# Patient Record
Sex: Female | Born: 1970 | Race: White | Hispanic: No | Marital: Single | Smoking: Current every day smoker
Health system: Southern US, Community
[De-identification: ages and names within clinical notes are randomized; demographics above are authoritative.]

## PROBLEM LIST (undated history)

## (undated) DIAGNOSIS — T4145XA Adverse effect of unspecified anesthetic, initial encounter: Secondary | ICD-10-CM

## (undated) DIAGNOSIS — E282 Polycystic ovarian syndrome: Secondary | ICD-10-CM

## (undated) DIAGNOSIS — K5792 Diverticulitis of intestine, part unspecified, without perforation or abscess without bleeding: Secondary | ICD-10-CM

## (undated) DIAGNOSIS — N946 Dysmenorrhea, unspecified: Secondary | ICD-10-CM

## (undated) DIAGNOSIS — T8859XA Other complications of anesthesia, initial encounter: Secondary | ICD-10-CM

## (undated) DIAGNOSIS — S2239XA Fracture of one rib, unspecified side, initial encounter for closed fracture: Secondary | ICD-10-CM

## (undated) DIAGNOSIS — F32A Depression, unspecified: Secondary | ICD-10-CM

## (undated) DIAGNOSIS — I82409 Acute embolism and thrombosis of unspecified deep veins of unspecified lower extremity: Secondary | ICD-10-CM

## (undated) DIAGNOSIS — S8011XA Contusion of right lower leg, initial encounter: Secondary | ICD-10-CM

## (undated) DIAGNOSIS — F419 Anxiety disorder, unspecified: Secondary | ICD-10-CM

## (undated) DIAGNOSIS — D689 Coagulation defect, unspecified: Secondary | ICD-10-CM

## (undated) DIAGNOSIS — N920 Excessive and frequent menstruation with regular cycle: Secondary | ICD-10-CM

## (undated) DIAGNOSIS — Z7901 Long term (current) use of anticoagulants: Secondary | ICD-10-CM

## (undated) DIAGNOSIS — Z9889 Other specified postprocedural states: Secondary | ICD-10-CM

## (undated) DIAGNOSIS — D6851 Activated protein C resistance: Secondary | ICD-10-CM

## (undated) DIAGNOSIS — F329 Major depressive disorder, single episode, unspecified: Secondary | ICD-10-CM

## (undated) DIAGNOSIS — K859 Acute pancreatitis without necrosis or infection, unspecified: Secondary | ICD-10-CM

## (undated) DIAGNOSIS — N809 Endometriosis, unspecified: Secondary | ICD-10-CM

## (undated) DIAGNOSIS — J189 Pneumonia, unspecified organism: Secondary | ICD-10-CM

## (undated) DIAGNOSIS — R112 Nausea with vomiting, unspecified: Secondary | ICD-10-CM

## (undated) HISTORY — DX: Fracture of one rib, unspecified side, initial encounter for closed fracture: S22.39XA

## (undated) HISTORY — DX: Excessive and frequent menstruation with regular cycle: N92.0

## (undated) HISTORY — DX: Diverticulitis of intestine, part unspecified, without perforation or abscess without bleeding: K57.92

## (undated) HISTORY — PX: ARTHROSCOPY KNEE W/ DRILLING: SUR92

## (undated) HISTORY — DX: Depression, unspecified: F32.A

## (undated) HISTORY — DX: Endometriosis, unspecified: N80.9

## (undated) HISTORY — PX: HERNIA REPAIR: SHX51

## (undated) HISTORY — DX: Polycystic ovarian syndrome: E28.2

## (undated) HISTORY — DX: Acute pancreatitis without necrosis or infection, unspecified: K85.90

## (undated) HISTORY — DX: Dysmenorrhea, unspecified: N94.6

## (undated) HISTORY — DX: Contusion of right lower leg, initial encounter: S80.11XA

## (undated) HISTORY — DX: Morbid (severe) obesity due to excess calories: E66.01

## (undated) HISTORY — DX: Anxiety disorder, unspecified: F41.9

## (undated) HISTORY — DX: Pneumonia, unspecified organism: J18.9

---
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
## 2003-06-14 HISTORY — PX: APPENDECTOMY: SHX54

## 2005-06-13 HISTORY — PX: BARIATRIC SURGERY: SHX1103

## 2005-11-04 ENCOUNTER — Ambulatory Visit: Payer: Self-pay | Admitting: Unknown Physician Specialty

## 2005-12-17 ENCOUNTER — Ambulatory Visit: Payer: Self-pay | Admitting: Unknown Physician Specialty

## 2007-03-01 ENCOUNTER — Emergency Department: Payer: Self-pay | Admitting: Emergency Medicine

## 2009-12-31 ENCOUNTER — Ambulatory Visit: Payer: Self-pay | Admitting: Unknown Physician Specialty

## 2012-07-10 ENCOUNTER — Ambulatory Visit (INDEPENDENT_AMBULATORY_CARE_PROVIDER_SITE_OTHER): Payer: BC Managed Care – PPO | Admitting: Internal Medicine

## 2012-07-10 ENCOUNTER — Encounter: Payer: Self-pay | Admitting: Internal Medicine

## 2012-07-10 VITALS — BP 128/80 | HR 92 | Temp 98.4°F | Resp 12 | Ht 63.5 in | Wt 311.0 lb

## 2012-07-10 DIAGNOSIS — Z6841 Body Mass Index (BMI) 40.0 and over, adult: Secondary | ICD-10-CM

## 2012-07-10 DIAGNOSIS — Z23 Encounter for immunization: Secondary | ICD-10-CM

## 2012-07-10 DIAGNOSIS — E559 Vitamin D deficiency, unspecified: Secondary | ICD-10-CM

## 2012-07-10 DIAGNOSIS — Z9884 Bariatric surgery status: Secondary | ICD-10-CM

## 2012-07-10 DIAGNOSIS — F411 Generalized anxiety disorder: Secondary | ICD-10-CM

## 2012-07-10 NOTE — Progress Notes (Signed)
Patient ID: Linda Ortiz, female   DOB: [birth year], 42 y.o.   MRN: [id number]     Patient Active Problem List  Diagnosis  . Generalized anxiety disorder  . Vitamin D deficiency  . Status post gastric bypass for obesity  . Morbid obesity with BMI of 40.0-44.9, adult    Subjective:  CC:   Chief Complaint  Patient presents with  . Establish Care    HPI:   Linda Ortiz is a 42 y.o. female who presents as a new patient to establish primary care with the chief complaint of  1) obesity .  She underwent gastric bypass surgery in 2007 for BMI > 50, top weight was 436 lbs an dost 236 lbs (nadir was 200 lbs) before  Starting  To gain it back 1 yr ago.  She cites emotional stressors as the cause.  Her sister was diagnosed with stage IV metastatic lung Ca last year.  Patient is a social  Smoker about 1/4 pack daily and is having difficulty quitting cold [place].  She plans to switch to e cigarette.   2) Anxiety with obsessive compulsive personality.  She resumed an antidepressant recently after being referred to Dr. Imogene Burn. She has a history of depression while matriculating at [employer], and improved with CBT and SSRI. She prefers to continue using effexor and is currently taking  25 mg bid for the past week .  She has no prior trial of  zoloft bc of personal taste. She is a Engineer, site,  Receiving CBT with Samuella Bruin at [facility]. Needs to have B12 folic acid ,  Vit d and cmet  3) Easy bruising. Dr. Imogene Burn is concerned that her nonadherence with bariatric vitamin may have  Affected her adversely and is requesting testing. She has not taken any bariatric vitamins (Building blocks) for years and is requesting use of OTCS if possible due to cost.      Past Medical History  Diagnosis Date  . Anxiety     Past Surgical History  Procedure Date  . Bariatric surgery 2007  . Appendectomy 2005  . Arthroscopy knee w/ drilling     Family History  Problem Relation Age of Onset  .  Arthritis Mother     rheumatoid  . Hypertension Mother   . Hyperlipidemia Father   . Mental illness Maternal Grandmother     dementia  . Alcohol abuse Maternal Grandmother   . ALS Paternal Grandmother     History   Social History  . Marital Status: Single    Spouse Name: N/A    Number of Children: N/A  . Years of Education: N/A   Occupational History  . Not on file.   Social History Main Topics  . Smoking status: Current Every Day Smoker -- 0.2 packs/day    Types: Cigarettes  . Smokeless tobacco: Never Used  . Alcohol Use: No  . Drug Use: No  . Sexually Active: Not on file   Other Topics Concern  . Not on file   Social History Narrative  . No narrative on file         @ALLHX @    Review of Systems:   The remainder of the review of systems was negative except those addressed in the HPI.       Objective:  BP 128/80  Pulse 92  Temp 98.4 F (36.9 C) (Oral)  Resp 12  Ht 5' 3.5" (1.613 m)  Wt 311 lb (141.069 kg)  BMI 54.23 kg/m2  SpO2 98%  General appearance: alert, cooperative and appears stated age Ears: normal TM's and external ear canals both ears Throat: lips, mucosa, and tongue normal; teeth and gums normal Neck: no adenopathy, no carotid bruit, supple, symmetrical, trachea midline and thyroid not enlarged, symmetric, no tenderness/mass/nodules Back: symmetric, no curvature. ROM normal. No CVA tenderness. Lungs: clear to auscultation bilaterally Heart: regular rate and rhythm, S1, S2 normal, no murmur, click, rub or gallop Abdomen: soft, non-tender; bowel sounds normal; no masses,  no organomegaly Pulses: 2+ and symmetric Skin: Skin color, texture, turgor normal. No rashes or lesions Lymph nodes: Cervical, supraclavicular, and axillary nodes normal.  Assessment and Plan:  Vitamin D deficiency Secondary to iatrogenic malaborption state induced by prior gastric surgery.,  Will start Drisdol weekly dosing for Vit D level of 11.    Generalized anxiety disorder managed by Psychiatry with initiation of effexor last week .,  Patient also resuming CBT through [facility].   Status post gastric bypass for obesity Has gained back > 100 lbs and non adherent to diet and supplemental vitamins.  Rechecking vitamin levels,  But Vit K was ommitted .  And may need to be added although she is not having excessive menstrual bleeding.   Morbid obesity with BMI of 40.0-44.9, adult She has gained > 100 lbs since her gastric bypass surgery in 2007.  Low GI diet recommended.  Screening for diabetes and thyroid dysfunction to be done.,    Updated Medication List Outpatient Encounter Prescriptions as of 07/10/2012  Medication Sig Dispense Refill  . ergocalciferol (DRISDOL) 50000 UNITS capsule Take 1 capsule (50,000 Units total) by mouth once a week.  12 capsule  3  . venlafaxine (EFFEXOR) 25 MG tablet Take 25 mg by mouth 2 (two) times daily.          Orders Placed This Encounter  Procedures  . Tdap vaccine greater than or equal to 7yo IM  . CBC with Differential  . Vitamin B12  . Comprehensive metabolic panel  . TSH  . Vitamin D 25 hydroxy  . Folate RBC    Return in about 3 months (around 10/08/2012).

## 2012-07-10 NOTE — Patient Instructions (Addendum)
 This is  my version of a  "Low GI"  Diet:  It is not ultra low carb, but will still lower your blood sugars and allow you to lose 5 to 10 lbs per month if you follow it carefully. All of the foods can be found at grocery stores and in bulk at [employer].  The Atkins protein bars and shakes are available in more varieties at [employer], [employer] and [employer].     7 AM Breakfast:  Low carbohydrate Protein  Shakes (I recommend the EAS AdvantEdge "Carb Control" shakes  Or the low carb shakes by Atkins.   Both are available everywhere:  In  cases at [employer]Js  Or in 4 packs at grocery stores and pharmacies  2.5 carbs  (Alternative is  a toasted Arnold's Sandwhich Thin w/ peanut butter, a "Bagel Thin" with cream cheese and salmon) or  a scrambled egg burrito made with a low carb tortilla .  Avoid cereal and bananas, oatmeal too unless you are cooking the old fashioned kind that takes 30-40 minutes to prepare.  the rest is overly processed, has minimal fiber, and is loaded with carbohydrates!   10 AM: Protein bar by Atkins (the snack size, under 200 cal).  There are many varieties , available widely again or in bulk in limited varieties at BJs)  Other so called "protein bars" tend to be loaded with carbohydrates.  Remember, in food advertising, the word "energy" is synonymous for " carbohydrate."  Lunch: sandwich of turkey, (or any lunchmeat, grilled meat or canned tuna), fresh avocado, mayonnaise  and cheese on a lower carbohydrate pita bread, flatbread, or tortilla . Ok to use regular mayonnaise. The bread is the only source or carbohydrate that can be decreased (Joseph's makes a pita bread and a flat bread that are 50 cal and 4 net carbs ; Toufayan makes a low carb flatbread that's 100 cal and 9 net carbs  and  Mission makes a low carb whole wheat tortilla  That is 210 cal and 6 net carbs)  3 PM:  Mid day :  Another protein bar,  Or a  cheese stick (100 cal, 0 carbs),  Or 1 ounce of  almonds, walnuts, pistachios,  pecans, peanuts,  Macadamia nuts. Or a Dannon light n Fit greek yogurt, 80 cal 8 net carbs . Avoid "granola"; the dried cranberries and raisins are loaded with carbohydrates. Mixed nuts ok if no raisins or cranberries or dried fruit.      6 PM  Dinner:  "mean and green:"  Meat/chicken/fish or a high protein legume; , with a green salad, and a low GI  Veggie (broccoli, cauliflower, green beans, spinach, brussel sprouts. Lima beans) : Avoid "Low fat dressings, as well as [place] and [place]! They are loaded with sugar! Instead use ranch, vinagrette,  Blue cheese, etc.  There is a low carb pasta by Dreamfield's available at [employer] grocery that is acceptable and tastes great. Try Michel Angel's chicken piccata over low carb pasta. The chicken dish is 0 carbs, and can be found in frozen section at BJs and [employer]. Also try Aaron Sanchez's "Carnitas" (pulled pork, no sauce,  0 carbs) and his pot roast.   both are in the refrigerated section at BJs   Dreamfield's makes a low carb pasta only 5 g/serving.  Available at all grocery stores,  And tastes like normal pasta  9 PM snack : Breyer's "low carb" fudgsicle or  ice cream bar (Carb Smart   line), or  Weight Watcher's ice cream bar , or another "no sugar added" ice cream;a serving of fresh berries/cherries with whipped cream (Avoid bananas, pineapple, grapes  and watermelon on a regular basis because they are high in sugar)   Remember that snack Substitutions should be less than 10 carbs per serving and meals < 20 carbs. Remember to subtract fiber grams and sugar alcohols to get the "net carbs."  

## 2012-07-11 ENCOUNTER — Telehealth: Payer: Self-pay | Admitting: General Practice

## 2012-07-11 ENCOUNTER — Encounter: Payer: Self-pay | Admitting: Internal Medicine

## 2012-07-11 DIAGNOSIS — E559 Vitamin D deficiency, unspecified: Secondary | ICD-10-CM | POA: Insufficient documentation

## 2012-07-11 DIAGNOSIS — Z9884 Bariatric surgery status: Secondary | ICD-10-CM | POA: Insufficient documentation

## 2012-07-11 LAB — COMPREHENSIVE METABOLIC PANEL
ALT: 15 U/L (ref 0–35)
BUN: 8 mg/dL (ref 6–23)
CO2: 20 mEq/L (ref 19–32)
Calcium: 9 mg/dL (ref 8.4–10.5)
Chloride: 105 mEq/L (ref 96–112)
Creatinine, Ser: 0.9 mg/dL (ref 0.4–1.2)
GFR: 74.67 mL/min (ref >60.00)
Glucose, Bld: 68 mg/dL — ABNORMAL LOW (ref 70–99)

## 2012-07-11 LAB — CBC WITH DIFFERENTIAL/PLATELET
Basophils Relative: 0.4 % (ref 0.0–3.0)
Eosinophils Relative: 1.2 % (ref 0.0–5.0)
HCT: 47.5 % — ABNORMAL HIGH (ref 36.0–46.0)
Hemoglobin: 15.9 g/dL — ABNORMAL HIGH (ref 12.0–15.0)
Lymphs Abs: 1.7 10*3/uL (ref 0.7–4.0)
MCV: 103.5 fl — ABNORMAL HIGH (ref 78.0–100.0)
Monocytes Absolute: 0.6 10*3/uL (ref 0.1–1.0)
Monocytes Relative: 5.5 % (ref 3.0–12.0)
Neutro Abs: 8.1 10*3/uL — ABNORMAL HIGH (ref 1.4–7.7)
WBC: 10.5 10*3/uL (ref 4.5–10.5)

## 2012-07-11 LAB — VITAMIN D 25 HYDROXY (VIT D DEFICIENCY, FRACTURES): Vit D, 25-Hydroxy: 11 ng/mL — ABNORMAL LOW (ref 30–89)

## 2012-07-11 LAB — FOLATE RBC: RBC Folate: 224 ng/mL — ABNORMAL LOW (ref >=366)

## 2012-07-11 LAB — TSH: TSH: 2.23 u[IU]/mL (ref 0.35–5.50)

## 2012-07-11 MED ORDER — ERGOCALCIFEROL 1.25 MG (50000 UT) PO CAPS: 50000.0000 [IU] | ORAL_CAPSULE | ORAL | Status: DC

## 2012-07-11 NOTE — Progress Notes (Signed)
Quick Note:  Her b12 and folate are both low. She needs to take 1 mg of folic acid daily and start weekly B12 injections ASAP. We can teach her how to self inject when she comes in for her first, 2) Her hemoglobin is elevated. This may be due to her cigarette smoking so she needs to quit and we should repeat it in a month . She does not need to resume the Building Block vitamins if she will take the individual ones I recommended. ______

## 2012-07-11 NOTE — Assessment & Plan Note (Signed)
Has gained back > 100 lbs and non adherent to diet and supplemental vitamins.  Rechecking vitamin levels,  But Vit K was ommitted .  And may need to be added although she is not having excessive menstrual bleeding.

## 2012-07-11 NOTE — Telephone Encounter (Signed)
Pt wants to know if she needs to go back on Bariatric vitamins or building blocks in addition to the Vitamin d you called in.

## 2012-07-11 NOTE — Telephone Encounter (Signed)
Pt.notified

## 2012-07-11 NOTE — Assessment & Plan Note (Signed)
managed by Psychiatry with initiation of effexor last week .,  Patient also resuming CBT through [facility].

## 2012-07-11 NOTE — Telephone Encounter (Signed)
Too soon to say,  All labs are not back yet

## 2012-07-11 NOTE — Assessment & Plan Note (Signed)
She has gained > 100 lbs since her gastric bypass surgery in 2007.  Low GI diet recommended.  Screening for diabetes and thyroid dysfunction to be done.,

## 2012-07-11 NOTE — Assessment & Plan Note (Signed)
Secondary to iatrogenic malaborption state induced by prior gastric surgery.,  Will start Drisdol weekly dosing for Vit D level of 11.

## 2012-07-12 ENCOUNTER — Telehealth: Payer: Self-pay | Admitting: Internal Medicine

## 2012-07-12 NOTE — Telephone Encounter (Signed)
Reminder: send copy of pt's labs to:   Dr.William Imogene Burn    [phone] phone   [phone] fax

## 2012-07-13 NOTE — Telephone Encounter (Signed)
Copy of patient's labs were faxed to Dr. Imogene Burn via Epic EMR..... Lambert Keto

## 2012-07-18 ENCOUNTER — Ambulatory Visit: Payer: BC Managed Care – PPO

## 2012-07-19 ENCOUNTER — Ambulatory Visit: Payer: BC Managed Care – PPO

## 2012-07-20 ENCOUNTER — Telehealth: Payer: Self-pay | Admitting: General Practice

## 2012-07-20 NOTE — Telephone Encounter (Signed)
Per pt's latest lab results we had advised pt to start taking 1mg  of Folate. Pt bought OTC folate but the highest dosage it comes in is . It this close enough?

## 2012-07-20 NOTE — Telephone Encounter (Signed)
Pt.notified

## 2012-07-20 NOTE — Telephone Encounter (Signed)
No, bc she is folic acid deficient,  She can take 2 of the 800 mcg otc pills daily.

## 2012-08-03 ENCOUNTER — Ambulatory Visit (INDEPENDENT_AMBULATORY_CARE_PROVIDER_SITE_OTHER): Payer: BC Managed Care – PPO | Admitting: *Deleted

## 2012-08-03 DIAGNOSIS — E538 Deficiency of other specified B group vitamins: Secondary | ICD-10-CM

## 2012-08-03 MED ORDER — CYANOCOBALAMIN 1000 MCG/ML IJ SOLN
1000.0000 ug | Freq: Once | INTRAMUSCULAR | Status: AC
  Administered 2012-08-03: 1000 ug via INTRAMUSCULAR

## 2012-10-19 ENCOUNTER — Encounter: Payer: BC Managed Care – PPO | Admitting: Internal Medicine

## 2012-12-19 ENCOUNTER — Encounter: Payer: BC Managed Care – PPO | Admitting: Internal Medicine

## 2013-01-08 ENCOUNTER — Encounter: Payer: Self-pay | Admitting: Adult Health

## 2013-01-08 ENCOUNTER — Ambulatory Visit (INDEPENDENT_AMBULATORY_CARE_PROVIDER_SITE_OTHER): Payer: BC Managed Care – PPO | Admitting: Adult Health

## 2013-01-08 ENCOUNTER — Telehealth: Payer: Self-pay | Admitting: Internal Medicine

## 2013-01-08 VITALS — BP 118/84 | HR 71 | Temp 98.0°F | Resp 12 | Wt 329.0 lb

## 2013-01-08 DIAGNOSIS — R300 Dysuria: Secondary | ICD-10-CM

## 2013-01-08 DIAGNOSIS — J069 Acute upper respiratory infection, unspecified: Secondary | ICD-10-CM

## 2013-01-08 DIAGNOSIS — R509 Fever, unspecified: Secondary | ICD-10-CM

## 2013-01-08 LAB — POCT URINALYSIS DIPSTICK
Glucose, UA: NEGATIVE
Ketones, UA: NEGATIVE
Leukocytes, UA: NEGATIVE
Nitrite, UA: NEGATIVE
Protein, UA: 30
Spec Grav, UA: >=1.03
Urobilinogen, UA: 0.2
pH, UA: 5.5

## 2013-01-08 LAB — POCT INFLUENZA A/B
Influenza A, POC: NEGATIVE
Influenza B, POC: NEGATIVE

## 2013-01-08 MED ORDER — HYDROCOD POLST-CHLORPHEN POLST 10-8 MG/5ML PO LQCR: 5.0000 mL | Freq: Two times a day (BID) | ORAL | Status: DC | PRN

## 2013-01-08 MED ORDER — AMOXICILLIN-POT CLAVULANATE 875-125 MG PO TABS: 1.0000 | ORAL_TABLET | Freq: Two times a day (BID) | ORAL | Status: DC

## 2013-01-08 MED ORDER — FLUCONAZOLE 150 MG PO TABS: 150.0000 mg | ORAL_TABLET | Freq: Once | ORAL | Status: DC

## 2013-01-08 MED ORDER — AZITHROMYCIN 250 MG PO TABS: ORAL_TABLET | ORAL | Status: DC

## 2013-01-08 NOTE — Telephone Encounter (Signed)
Pt has had fever since Friday morning, cough, sniffles, etc.  Pt has been taking Advil/Tylenol to help with fever.  Appt has been made w/ Dr. Darrick Huntsman Friday 8/1, pt states she would like to try to tough it out until then.  Pt asking if Dr. Darrick Huntsman feels she should be seen sooner for the fever and if so, she is willing to see Raquel.  Please advise.

## 2013-01-08 NOTE — Telephone Encounter (Signed)
yes

## 2013-01-08 NOTE — Assessment & Plan Note (Addendum)
Start Augmentin. Tussionex for cough. RTC if symptoms are not improved within 3-4 days. Letter to return to work provided. Diflucan prescription in the event she develops a yeast infection from antibiotic. Note greater than 30 minutes were spent in face to face communication with patient in the assessment and planning pertaining to these problems. I had already established a plan of care with a different antibiotic when she added that she thought she had a uti. This took additional time as I had to get a urine sample and later change antibiotic to cover both sinus and uti. Then patient wanted a letter to allow her to return to work.

## 2013-01-08 NOTE — Telephone Encounter (Signed)
No, i will leave that decision  to raquel

## 2013-01-08 NOTE — Patient Instructions (Signed)
What is sinusitis? - Sinusitis is a condition that can cause a stuffy nose, pain in the face, and yellow or green discharge (mucus) from the nose. The sinuses are hollow areas in the bones of the face. They have a thin lining that normally makes a small amount of mucus. When this lining gets infected, it swells and makes extra mucus. This causes symptoms.   Sinusitis can occur when a person gets sick with a cold. The germs causing the cold can also infect the sinuses. Many times, a person feels like his or her cold is getting better. But then he or she gets sinusitis and begins to feel sick again.  What are the symptoms of sinusitis? - Common symptoms of sinusitis include: Stuffy or blocked nose  Thick yellow or green discharge from the nose  Pain in the teeth  Pain or pressure in the face - This often feels worse when a person bends forward.   People with sinusitis can also have other symptoms that include: Fever  Cough  Trouble smelling  Ear pressure or fullness  Headache  Bad breath  Feeling tired   Most of the time, symptoms start to improve in 7 to 10 days.  Should I see a doctor or nurse? - See your doctor or nurse if your symptoms last more than 7 days, or if your symptoms get better at first but then get worse. Sometimes, sinusitis can lead to serious problems. See your doctor or nurse right away (do not wait 7 days) if you have: Fever higher than 102.48F (39.2C)  Sudden and severe pain in the face and head  Trouble seeing or seeing double  Trouble thinking clearly  Swelling or redness around 1 or both eyes  Trouble breathing or a stiff neck   Is there anything I can do on my own to feel better? - Yes. To reduce your symptoms, you can: Take an over-the-counter pain reliever to reduce the pain  Rinse your nose and sinuses with salt water a few times a day - Ask your doctor or nurse about the best way to do this.  Use a decongestant nose spray - These sprays are sold in a  pharmacy. But do not use decongestant nose sprays for more than 2 to 3 days in a row. Using them more than 3 days in a row can make symptoms worse.   You should NOT take an antihistamine for sinusitis. Common antihistamines include diphenhydramine (sample brand name: Benadryl), chlorpheniramine (sample brand name: Chlor-Trimeton), loratadine (sample brand name: Claritin), and cetirizine (sample brand name: Zyrtec). They can treat allergies, but not sinus infections, and could increase your discomfort by drying the lining of your nose and sinuses, or making you tired.   Your doctor might also prescribe a steroid nose spray to reduce the swelling in your nose. (Steroid nose sprays do not contain the same steroids that athletes take to build muscle.)  How is sinusitis treated? - Most of the time, sinusitis does not need to be treated with antibiotic medicines. This is because most sinusitis is caused by viruses - not bacteria - and antibiotics do not kill viruses. Many people get over sinus infections without antibiotics.  Some people with sinusitis do need treatment with antibiotics. If your symptoms have not improved after 7 to 10 days, ask your doctor if you should take antibiotics. Your doctor might recommend that you wait 1 more week to see if your symptoms improve. But if you have symptoms such as  a fever or a lot of pain, he or she might prescribe antibiotics. It is important to follow your doctor's instructions about taking your antibiotics.    What is the urinary tract? - The urinary tract is the group of organs in the body that handle urine. The urinary tract includes the:   Kidneys, two bean-shaped organs that filter the blood to make urine  Bladder, a balloon-shaped organ that stores urine  Ureters, two tubes that carry urine from the kidneys to the bladder  Urethra, the tube that carries urine from the bladder to the outside of the body   What are urinary tract infections? - Urinary  tract infections, also called "UTIs," are infections that affect either the bladder or the kidneys. Bladder infections are more common than kidney infections. Bladder infections happen when bacteria get into the urethra and travel up into the bladder. Kidney infections happen when the bacteria travel even higher, up into the kidneys. Both bladder and kidney infections are more common in women than men.   What are the symptoms of a bladder infection? - The symptoms include:   Pain or a burning feeling when you urinate  The need to urinate often  The need to urinate suddenly or in a hurry  Blood in the urine   What are the symptoms of a kidney infection? - The symptoms of a kidney infection can include the symptoms of a bladder infection, but kidney infections can also cause:   Fever  Back pain  Nausea or vomiting   How are urinary tract infections treated? - Most urinary tract infections are treated with antibiotic pills. These pills work by killing the germs that cause the infection.  If you have a bladder infection, you will probably need to take antibiotics for 3 to 7 days. If you have a kidney infection, you will probably need to take antibiotics for longer-maybe for up to 2 weeks. If you have a kidney infection, it's also possible you will need to be treated in the hospital.   Your symptoms should begin to improve within a day of starting antibiotics. But you should finish all the antibiotic pills you get. Otherwise your infection might come back.  If needed, you can also take a medicine to numb your bladder such as AZO or Urostat which are sold over the counter.   Can cranberry juice or other cranberry products prevent bladder infections? - The studies suggesting that cranberry products prevent bladder infections are not very good. But if you want to try cranberry products for this purpose, there is probably not much harm in doing so.

## 2013-01-08 NOTE — Assessment & Plan Note (Signed)
UA dipstick shows negative nitrite, leukocytes, small amt blood. Send urine for culture. Start Augmentin.

## 2013-01-08 NOTE — Telephone Encounter (Signed)
Patient scheduled with Linda Rey NP at 3:30 today for fever and cough. Patient reports fever of 102 and cough with clear mucus.  Please advise as to an chest X ray or not .

## 2013-01-08 NOTE — Telephone Encounter (Signed)
I would like to examine her first.

## 2013-01-08 NOTE — Progress Notes (Signed)
  Subjective:    Patient ID: Linda Ortiz, female    DOB: 1970/11/10, 42 y.o.   MRN: [id number]  HPI  Patient is a 42 year old female with history of generalized anxiety disorder, vitamin D deficiency, morbid obesity status post gastric bypass surgery in 2007 who presents to clinic with cough which started on Wednesday. She reports developing a fever on Friday. Highest temp of 102. She has been taking tylenol for her fever. She is afebrile today. She reports GI irritation from the medication she has been taking. She has increased sensitivity since her gastric bypass surgery. Patient has post nasal drip, sinus pressure, rhinorrhea, congestion. Her cough has been keeping her up at night. She has tried Delsym, robitussin but these have not help her cough. She reports cough is productive at times and dry at other times. We were done with visit when patient reports the following:  Patient reports that she has been seeing a chiropractor for back pain. However, she is wondering if her symptoms may be related to having a UTI. She denies any burning with urination but she reports a urethral discomfort. She denies hematuria or foul odor to her urine. She has low back pain but this is not any different than what she has been seeing chiropractor for. She denies flank tenderness.   Patient reports that she gets yeast infections. She would like a prescription for medication in the event that she develops one from the antibiotic.   Current Outpatient Prescriptions on File Prior to Visit  Medication Sig Dispense Refill  . ergocalciferol (DRISDOL) 50000 UNITS capsule Take 1 capsule (50,000 Units total) by mouth once a week.  12 capsule  3   No current facility-administered medications on file prior to visit.    Review of Systems  Constitutional: Positive for fever and chills.  HENT: Positive for congestion, rhinorrhea, postnasal drip and sinus pressure.   Respiratory: Positive for cough and wheezing.    Gastrointestinal: Positive for nausea.       Pt thinks the nausea is from the medication she is taking.  Genitourinary: Positive for dysuria. Negative for urgency, frequency, hematuria, flank pain and pelvic pain.       Back discomfort  Neurological: Positive for headaches.  Psychiatric/Behavioral: Positive for sleep disturbance.       Sleep disturbance 2/2 coughing    BP 118/84  Pulse 71  Temp(Src) 98 F (36.7 C) (Oral)  Resp 12  Wt 329 lb (149.233 kg)  BMI 57.36 kg/m2  SpO2 96%    Objective:   Physical Exam  Constitutional: She is oriented to person, place, and time.  Obese, pleasant female who appears acutely ill  HENT:  Head: Normocephalic and atraumatic.  Right Ear: External ear normal.  Left Ear: External ear normal.  Pharyngeal erythema. Posterior pharyngeal drainage  Cardiovascular: Normal rate, regular rhythm and normal heart sounds.  Exam reveals no gallop.   No murmur heard. Pulmonary/Chest: Effort normal and breath sounds normal. No respiratory distress. She has no wheezes. She has no rales.  Abdominal: Soft. Bowel sounds are normal.  Genitourinary:  No suprapubic discomfort  Lymphadenopathy:    She has no cervical adenopathy.  Neurological: She is alert and oriented to person, place, and time.  Skin: Skin is warm and dry.  Psychiatric: She has a normal mood and affect. Her behavior is normal. Judgment and thought content normal.      Assessment & Plan:

## 2013-01-08 NOTE — Telephone Encounter (Signed)
Would you like a urine culture?  

## 2013-01-09 NOTE — Addendum Note (Signed)
Addended by: Montine Circle D on: 01/09/2013 07:55 AM   Modules accepted: Orders

## 2013-01-11 ENCOUNTER — Ambulatory Visit: Payer: BC Managed Care – PPO | Admitting: Internal Medicine

## 2013-03-21 ENCOUNTER — Ambulatory Visit (INDEPENDENT_AMBULATORY_CARE_PROVIDER_SITE_OTHER): Payer: BC Managed Care – PPO | Admitting: Internal Medicine

## 2013-03-21 ENCOUNTER — Encounter: Payer: Self-pay | Admitting: Internal Medicine

## 2013-03-21 ENCOUNTER — Encounter (INDEPENDENT_AMBULATORY_CARE_PROVIDER_SITE_OTHER): Payer: Self-pay

## 2013-03-21 VITALS — BP 142/82 | HR 101 | Temp 97.8°F | Resp 14 | Ht 63.5 in | Wt 336.2 lb

## 2013-03-21 DIAGNOSIS — M25879 Other specified joint disorders, unspecified ankle and foot: Secondary | ICD-10-CM

## 2013-03-21 DIAGNOSIS — Z13220 Encounter for screening for lipoid disorders: Secondary | ICD-10-CM

## 2013-03-21 DIAGNOSIS — R5381 Other malaise: Secondary | ICD-10-CM

## 2013-03-21 DIAGNOSIS — E559 Vitamin D deficiency, unspecified: Secondary | ICD-10-CM

## 2013-03-21 DIAGNOSIS — Z6841 Body Mass Index (BMI) 40.0 and over, adult: Secondary | ICD-10-CM

## 2013-03-21 DIAGNOSIS — Z9884 Bariatric surgery status: Secondary | ICD-10-CM

## 2013-03-21 NOTE — Progress Notes (Signed)
Patient ID: Linda Ortiz, female   DOB: July 03, 1970, 42 y.o.   MRN: [id number]   Patient Active Problem List   Diagnosis Date Noted  . Cyst of joint of ankle or foot 03/23/2013  . URI (upper respiratory infection) 01/08/2013  . Dysuria 01/08/2013  . Vitamin D deficiency 07/11/2012  . Status post gastric bypass for obesity 07/11/2012  . Morbid obesity with BMI of 40.0-44.9, adult 07/11/2012  . Generalized anxiety disorder     Subjective:  CC:   Chief Complaint  Patient presents with  . Acute Visit    nodule on top left foot lateral to ankle.    HPI:   Linda Ortiz a 42 y.o. female who presents with an Acute problem.  Last seen January 2014. She has developed what she thinks is a ganglionic cyst on the dorsum of her Left foot .  The swelling has been present ofr a month or so and becomes tender with certain shoes and movements.  She has a history of remote ankle injury 9 yrs ago that was treated with casting and walking boots.    Has tried treating the pain with tylenol and motrin which have minimally helped.  The swelling has increased. Marland Kitchen   Her sister died recently of metastatic lung cancer and presented with hip pain .   Past Medical History  Diagnosis Date  . Anxiety     Past Surgical History  Procedure Laterality Date  . Bariatric surgery  2007  . Appendectomy  2005  . Arthroscopy knee w/ drilling         The following portions of the patient's history were reviewed and updated as appropriate: Allergies, current medications, and problem list.    Review of Systems:   12 Pt  review of systems was negative except those addressed in the HPI,     History   Social History  . Marital Status: Single    Spouse Name: N/A    Number of Children: N/A  . Years of Education: N/A   Occupational History  . Not on file.   Social History Main Topics  . Smoking status: Current Every Day Smoker -- 0.25 packs/day    Types: Cigarettes  . Smokeless tobacco: Never Used   . Alcohol Use: No  . Drug Use: No  . Sexual Activity: Not on file   Other Topics Concern  . Not on file   Social History Narrative  . No narrative on file    Objective:  Filed Vitals:   03/21/13 1041  BP: 142/82  Pulse: 101  Temp: 97.8 F (36.6 C)  Resp: 14     General appearance: alert, cooperative and appears stated age Ears: normal TM's and external ear canals both ears Throat: lips, mucosa, and tongue normal; teeth and gums normal Neck: no adenopathy, no carotid bruit, supple, symmetrical, trachea midline and thyroid not enlarged, symmetric, no tenderness/mass/nodules Back: symmetric, no curvature. ROM normal. No CVA tenderness. Lungs: clear to auscultation bilaterally Heart: regular rate and rhythm, S1, S2 normal, no murmur, click, rub or gallop Abdomen: soft, non-tender; bowel sounds normal; no masses,  no organomegaly Pulses: 2+ and symmetric Skin: Skin color, texture, turgor normal. No rashes or lesions Lymph nodes: Cervical, supraclavicular, and axillary nodes normal.  Assessment and Plan:  Cyst of joint of ankle or foot Suspected by exam.  Plain films ordered.   Status post gastric bypass for obesity She has not had bariatric labs done in since January ad was b12 folate and Vit  D deficient at that time.   She will return for fasting labs and vitamin levels   Morbid obesity with BMI of 40.0-44.9, adult With weight regain of 139 lbs since reaching a nadir of 200 lbs after gastric bypass surgery.  Counselling given, but she does not appear to be motivated to start exercising or losing weight at this time. Diet reviewed.  Vitamin D deficiency Noted in January secondary to bariatric surgery  trarted with Drisdol ,  Return for  Repeat  fasting labs.    Updated Medication List Outpatient Encounter Prescriptions as of 03/21/2013  Medication Sig Dispense Refill  . clonazePAM (KLONOPIN) 0.5 MG tablet Take 1 tablet by mouth at bedtime.      . cyanocobalamin 500  MCG tablet Take 1,000 mcg by mouth 2 (two) times daily. Patient takes total of 3000 mcg      . ergocalciferol (DRISDOL) 50000 UNITS capsule Take 1 capsule (50,000 Units total) by mouth once a week.  12 capsule  3  . folic acid (FOLVITE) 800 MCG tablet Take 800 mcg by mouth 2 (two) times daily.       Marland Kitchen lamoTRIgine (LAMICTAL) 25 MG tablet Take 100 mg by mouth at bedtime.       Marland Kitchen venlafaxine (EFFEXOR) 50 MG tablet Take 50 mg by mouth 2 (two) times daily.      . [DISCONTINUED] amoxicillin-clavulanate (AUGMENTIN) 875-125 MG per tablet Take 1 tablet by mouth 2 (two) times daily.  14 tablet  0  . [DISCONTINUED] chlorpheniramine-HYDROcodone (TUSSIONEX) 10-8 MG/5ML LQCR Take 5 mLs by mouth every 12 (twelve) hours as needed.  140 mL  0  . [DISCONTINUED] fluconazole (DIFLUCAN) 150 MG tablet Take 1 tablet (150 mg total) by mouth once.  1 tablet  0   No facility-administered encounter medications on file as of 03/21/2013.

## 2013-03-23 ENCOUNTER — Encounter: Payer: Self-pay | Admitting: Internal Medicine

## 2013-03-23 DIAGNOSIS — M25879 Other specified joint disorders, unspecified ankle and foot: Secondary | ICD-10-CM | POA: Insufficient documentation

## 2013-03-23 NOTE — Assessment & Plan Note (Addendum)
She has not had bariatric labs done in since January ad was b12 folate and Vit D deficient at that time.   She will return for fasting labs and vitamin levels

## 2013-03-23 NOTE — Assessment & Plan Note (Signed)
Noted in January secondary to bariatric surgery  trarted with Drisdol ,  Return for  Repeat  fasting labs.

## 2013-03-23 NOTE — Assessment & Plan Note (Signed)
Suspected by exam.  Plain films ordered.

## 2013-03-23 NOTE — Assessment & Plan Note (Signed)
With weight regain of 139 lbs since reaching a nadir of 200 lbs after gastric bypass surgery.  Counselling given, but she does not appear to be motivated to start exercising or losing weight at this time. Diet reviewed.

## 2013-03-25 ENCOUNTER — Telehealth: Payer: Self-pay | Admitting: Internal Medicine

## 2013-03-25 NOTE — Telephone Encounter (Signed)
Pt wants to see dr Ether Griffins not dr Al Corpus  Please call Ms Gulick about her appointment [phone]

## 2013-04-10 ENCOUNTER — Ambulatory Visit: Payer: Self-pay | Admitting: Podiatry

## 2013-04-26 ENCOUNTER — Other Ambulatory Visit (INDEPENDENT_AMBULATORY_CARE_PROVIDER_SITE_OTHER): Payer: BC Managed Care – PPO

## 2013-04-26 ENCOUNTER — Encounter (INDEPENDENT_AMBULATORY_CARE_PROVIDER_SITE_OTHER): Payer: Self-pay

## 2013-04-26 DIAGNOSIS — R5381 Other malaise: Secondary | ICD-10-CM

## 2013-04-26 DIAGNOSIS — Z9884 Bariatric surgery status: Secondary | ICD-10-CM

## 2013-04-26 DIAGNOSIS — Z13220 Encounter for screening for lipoid disorders: Secondary | ICD-10-CM

## 2013-04-26 LAB — TSH: TSH: 2.69 u[IU]/mL (ref 0.35–5.50)

## 2013-04-26 LAB — COMPREHENSIVE METABOLIC PANEL
ALT: 18 U/L (ref 0–35)
AST: 18 U/L (ref 0–37)
Albumin: 3.3 g/dL — ABNORMAL LOW (ref 3.5–5.2)
BUN: 11 mg/dL (ref 6–23)
CO2: 26 mEq/L (ref 19–32)
Calcium: 9 mg/dL (ref 8.4–10.5)
Chloride: 105 mEq/L (ref 96–112)
GFR: 76.35 mL/min (ref >60.00)
Glucose, Bld: 87 mg/dL (ref 70–99)
Potassium: 4.3 mEq/L (ref 3.5–5.1)
Total Bilirubin: 0.8 mg/dL (ref 0.3–1.2)
Total Protein: 6.5 g/dL (ref 6.0–8.3)

## 2013-04-26 LAB — LIPID PANEL
Total CHOL/HDL Ratio: 3
VLDL: 25.8 mg/dL (ref 0.0–40.0)

## 2013-04-26 LAB — VITAMIN B12: Vitamin B-12: 1144 pg/mL — ABNORMAL HIGH (ref 211–911)

## 2013-04-27 LAB — VITAMIN D 25 HYDROXY (VIT D DEFICIENCY, FRACTURES): Vit D, 25-Hydroxy: 23 ng/mL — ABNORMAL LOW (ref 30–89)

## 2013-04-28 MED ORDER — ERGOCALCIFEROL 1.25 MG (50000 UT) PO CAPS: 50000.0000 [IU] | ORAL_CAPSULE | ORAL | Status: DC

## 2013-04-28 MED ORDER — VITAMIN D (ERGOCALCIFEROL) 1.25 MG (50000 UNIT) PO CAPS: 50000.0000 [IU] | ORAL_CAPSULE | ORAL | Status: DC

## 2013-04-28 NOTE — Addendum Note (Signed)
Addended by: Sherlene Shams on: 04/28/2013 09:41 PM   Modules accepted: Orders

## 2013-04-29 ENCOUNTER — Encounter: Payer: Self-pay | Admitting: *Deleted

## 2013-04-29 LAB — VITAMIN A

## 2013-04-29 LAB — ZINC

## 2013-04-29 LAB — VITAMIN B6

## 2013-04-29 LAB — VITAMIN E

## 2013-04-29 NOTE — Addendum Note (Signed)
Addended by: Montine Circle D on: 04/29/2013 03:44 PM   Modules accepted: Orders

## 2013-05-03 ENCOUNTER — Encounter (INDEPENDENT_AMBULATORY_CARE_PROVIDER_SITE_OTHER): Payer: Self-pay

## 2013-05-03 ENCOUNTER — Encounter: Payer: Self-pay | Admitting: Internal Medicine

## 2013-05-03 ENCOUNTER — Ambulatory Visit (INDEPENDENT_AMBULATORY_CARE_PROVIDER_SITE_OTHER): Payer: BC Managed Care – PPO | Admitting: Internal Medicine

## 2013-05-03 VITALS — BP 152/96 | HR 103 | Temp 98.6°F | Resp 14 | Ht 63.75 in | Wt 339.5 lb

## 2013-05-03 DIAGNOSIS — Z23 Encounter for immunization: Secondary | ICD-10-CM

## 2013-05-03 DIAGNOSIS — E559 Vitamin D deficiency, unspecified: Secondary | ICD-10-CM

## 2013-05-03 DIAGNOSIS — M25529 Pain in unspecified elbow: Secondary | ICD-10-CM

## 2013-05-03 DIAGNOSIS — R030 Elevated blood-pressure reading, without diagnosis of hypertension: Secondary | ICD-10-CM

## 2013-05-03 DIAGNOSIS — Z0000 Encounter for general adult medical examination without abnormal findings: Secondary | ICD-10-CM

## 2013-05-03 DIAGNOSIS — Z6841 Body Mass Index (BMI) 40.0 and over, adult: Secondary | ICD-10-CM

## 2013-05-03 DIAGNOSIS — M25521 Pain in right elbow: Secondary | ICD-10-CM

## 2013-05-03 LAB — VITAMIN K1, SERUM: VITAMIN K1: 130 pg/mL (ref 80–1160)

## 2013-05-03 MED ORDER — METFORMIN HCL 500 MG PO TABS: 500.0000 mg | ORAL_TABLET | Freq: Two times a day (BID) | ORAL | Status: DC

## 2013-05-03 MED ORDER — ERGOCALCIFEROL 1.25 MG (50000 UT) PO CAPS: 50000.0000 [IU] | ORAL_CAPSULE | ORAL | Status: DC

## 2013-05-03 NOTE — Patient Instructions (Signed)
Trial of once daily metformin for PCOS,  Take in the evening  We discussed trying phentermine in a low dose if the metformin does not help you lose weight  Get your BP checked by the school RN a few tmes and call readings to the office

## 2013-05-03 NOTE — Progress Notes (Signed)
Patient ID: Linda Ortiz, female   DOB: 04-Jan-1971, 42 y.o.   MRN: [id number]    Subjective:     Linda Ortiz is a 42 y.o. female and is here for a comprehensive physical exam. The patient reports The following problems:  1) bilateral lateral elbow pain.  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  Pain started 3 weeks ago. This is a seasonal annual activity and she plays the smaller bells.  Using advil 400 mg not more than once daily, helps a little  But not completely and returns.   2) some vaginal bleeding and vaginal pain for the last t o 4 months .  Occurs when ovulating. Scant.  3) wt gain   [place] PAP smear by [facility] in 2012,  No  abnormals.    History   Social History  . Marital Status: Single    Spouse Name: N/A    Number of Children: N/A  . Years of Education: N/A   Occupational History  . Not on file.   Social History Main Topics  . Smoking status: Current Every Day Smoker -- 0.25 packs/day    Types: Cigarettes  . Smokeless tobacco: Never Used  . Alcohol Use: No  . Drug Use: No  . Sexual Activity: Not on file   Other Topics Concern  . Not on file   Social History Narrative  . No narrative on file   Health Maintenance  Topic Date Due  . Pap Smear  02/08/2013  . Influenza Vaccine  01/11/2014  . Tetanus/tdap  07/10/2022    The following portions of the patient's history were reviewed and updated as appropriate: allergies, current medications, past family history, past medical history, past social history, past surgical history and problem list.  Review of Systems A comprehensive review of systems was negative.   Objective:    General Appearance:    Alert, cooperative, no distress, appears stated age  Head:    Normocephalic, without obvious abnormality, atraumatic  Eyes:    PERRL, conjunctiva/corneas clear, EOM's intact, fundi    benign, both eyes  Ears:    Normal TM's and external ear canals, both ears   Nose:   Nares normal, septum midline, mucosa normal, no drainage    or sinus tenderness  Throat:   Lips, mucosa, and tongue normal; teeth and gums normal  Neck:   Supple, symmetrical, trachea midline, no adenopathy;    thyroid:  no enlargement/tenderness/nodules; no carotid   bruit or JVD  Back:     Symmetric, no curvature, ROM normal, no CVA tenderness  Lungs:     Clear to auscultation bilaterally, respirations unlabored  Chest Wall:    No tenderness or deformity   Heart:    Regular rate and rhythm, S1 and S2 normal, no murmur, rub   or gallop  Breast Exam:    No tenderness, masses, or nipple abnormality  Abdomen:     Soft, non-tender, bowel sounds active all four quadrants,    no masses, no organomegaly  Genitalia:    Pelvic: cervix normal in appearance, external genitalia normal, no adnexal masses or tenderness, no cervical motion tenderness, rectovaginal septum normal, uterus normal size, shape, and consistency and vagina normal without discharge  Extremities:   Extremities normal, atraumatic, no cyanosis or edema  Pulses:   2+ and symmetric all extremities  Skin:   Skin color, texture, turgor normal, no rashes or lesions  Lymph nodes:   Cervical, supraclavicular, and  axillary nodes normal  Neurologic:   CNII-XII intact, normal strength, sensation and reflexes    throughout   Assessment and Plan:   Morbid obesity with BMI of 40.0-44.9, adult Despite gastric bypass surgery,  She has not been able to maintain normal BMI and has gained 21 lbs this past year BMi is > 55. She is unde psychiatric management for anxiety/OCD and receives CBT. Her nadir post suegery was 200 lbs.. Trial of metformin. Low GI diet discussed.   Vitamin D deficiency Improved but not resolved.  Drisdol prescribed.   Routine general medical examination at a health care facility Annual comprehensive exam was done including breast, pelvic and PAP smear. All screenings have been addressed .   Elevated blood  pressure reading without diagnosis of hypertension Elevated today.  Reviewed list of meds, patient is not taking OTC meds that could be causing,. It.  Have asked patient to recheck bp at home a minimum of 5 times over the next 4 weeks and call readings to office for adjustment of medications.    Pain of both elbows Bilateral lateral elbow pain of recent origin.  Reassurance that her presentation is atypical for RA.    Updated Medication List Outpatient Encounter Prescriptions as of 05/03/2013  Medication Sig  . cyanocobalamin 500 MCG tablet Take 1,000 mcg by mouth 2 (two) times daily. Patient takes total of 3000 mcg  . ergocalciferol (DRISDOL) 50000 UNITS capsule Take 1 capsule (50,000 Units total) by mouth once a week.  . folic acid (FOLVITE) 800 MCG tablet Take 800 mcg by mouth 2 (two) times daily.   Marland Kitchen lamoTRIgine (LAMICTAL) 25 MG tablet Take 100 mg by mouth at bedtime.   Marland Kitchen venlafaxine (EFFEXOR) 50 MG tablet Take 50 mg by mouth 2 (two) times daily.  . [DISCONTINUED] ergocalciferol (DRISDOL) 50000 UNITS capsule Take 1 capsule (50,000 Units total) by mouth once a week.  . clonazePAM (KLONOPIN) 0.5 MG tablet Take 1 tablet by mouth at bedtime.  . metFORMIN (GLUCOPHAGE) 500 MG tablet Take 1 tablet (500 mg total) by mouth 2 (two) times daily with a meal.  . [DISCONTINUED] Vitamin D, Ergocalciferol, (DRISDOL) 50000 UNITS CAPS capsule Take 1 capsule (50,000 Units total) by mouth every 7 (seven) days.

## 2013-05-03 NOTE — Progress Notes (Signed)
Pre-visit discussion using our clinic review tool. No additional management support is needed unless otherwise documented below in the visit note.  

## 2013-05-05 ENCOUNTER — Encounter: Payer: Self-pay | Admitting: Internal Medicine

## 2013-05-05 DIAGNOSIS — R030 Elevated blood-pressure reading, without diagnosis of hypertension: Secondary | ICD-10-CM | POA: Insufficient documentation

## 2013-05-05 DIAGNOSIS — M25521 Pain in right elbow: Secondary | ICD-10-CM | POA: Insufficient documentation

## 2013-05-05 DIAGNOSIS — Z0000 Encounter for general adult medical examination without abnormal findings: Secondary | ICD-10-CM | POA: Insufficient documentation

## 2013-05-05 NOTE — Assessment & Plan Note (Signed)
Annual comprehensive exam was done including breast, pelvic and PAP smear. All screenings have been addressed .  

## 2013-05-05 NOTE — Assessment & Plan Note (Signed)
Elevated today.  Reviewed list of meds, patient is not taking OTC meds that could be causing,. It.  Have asked patient to recheck bp at home a minimum of 5 times over the next 4 weeks and call readings to office for adjustment of medications.   

## 2013-05-05 NOTE — Assessment & Plan Note (Addendum)
Despite gastric bypass surgery,  She has not been able to maintain normal BMI and has gained 21 lbs this past year BMi is > 55. She is unde psychiatric management for anxiety/OCD and receives CBT. Her nadir post suegery was 200 lbs.. Trial of metformin. Low GI diet discussed.

## 2013-05-05 NOTE — Assessment & Plan Note (Signed)
Bilateral lateral elbow pain of recent origin.  Reassurance that her presentation is atypical for RA.

## 2013-05-05 NOTE — Assessment & Plan Note (Signed)
Improved but not resolved.  Drisdol prescribed.

## 2013-05-29 ENCOUNTER — Telehealth: Payer: Self-pay | Admitting: Internal Medicine

## 2013-05-29 NOTE — Telephone Encounter (Signed)
noted 

## 2013-05-29 NOTE — Telephone Encounter (Signed)
Patient Information:  Caller Name: Billiejean  Phone: [phone]  Patient: Linda Ortiz  Gender: Female  DOB: [birth year]  Age: 42 Years  PCP: Duncan Dull (Adults only)  Pregnant: No  Office Follow Up:  Does the office need to follow up with this patient?: No  Instructions For The Office: N/A  RN Note:  Pt reports a dry cough, intermittent fever though her fever broke today. She's been taking Delsym for her cough but that seems to have stopped working.  Symptoms  Reason For Call & Symptoms: Cough  Reviewed Health History In EMR: Yes  Reviewed Medications In EMR: Yes  Reviewed Allergies In EMR: Yes  Reviewed Surgeries / Procedures: No  Date of Onset of Symptoms: 05/27/2013  Treatments Tried: Delsym  Treatments Tried Worked: No OB / GYN:  LMP: 05/20/2013  Guideline(s) Used:  Cough  Disposition Per Guideline:   Home Care  Reason For Disposition Reached:   Cough with no complications  Advice Given:  Reassurance  Coughing is the way that our lungs remove irritants and mucus. It helps protect our lungs from getting pneumonia.  You can get a dry hacking cough after a chest cold. Sometimes this type of cough can last 1-3 weeks, and be worse at night.  You can also get a cough after being exposed to irritating substances like smoke, strong perfumes, and dust.  Here is some care advice that should help.  Cough Medicines:  OTC Cough Syrups: The most common cough suppressant in OTC cough medications is dextromethorphan. Often the letters "DM" appear in the name.  Home Remedy - Honey: This old home remedy has been shown to help decrease coughing at night. The adult dosage is 2 teaspoons (10 ml) at bedtime. Honey should not be given to infants under one year of age.  OTC Cough Syrup - Dextromethorphan:  Examples: Benylin, Robitussin DM, Vicks 44 Cough Relief  Prevent Dehydration:  Drink adequate liquids.  This will help soothe an irritated or dry throat and loosen up the  phlegm.  Coughing Spasms:  Drink warm fluids. Inhale warm mist (Reason: both relax the airway and loosen up the phlegm).  Call Back If:  Difficulty breathing  Cough lasts more than 3 weeks  Fever lasts > 3 days  You become worse.  Patient Will Follow Care Advice:  YES

## 2013-05-30 ENCOUNTER — Telehealth: Payer: Self-pay | Admitting: Internal Medicine

## 2013-05-30 NOTE — Telephone Encounter (Signed)
The patient is wanting a refill on Tussionex for her cough.

## 2013-05-31 NOTE — Telephone Encounter (Signed)
Needs to be seen prior to Rx. We can either try to see at 11:15 or see on Monday afternoon.

## 2013-05-31 NOTE — Telephone Encounter (Signed)
The patient called I informed her that she would have to be seen before refilling and she stated O.K

## 2013-05-31 NOTE — Telephone Encounter (Signed)
Please call pt to schedule

## 2013-08-20 ENCOUNTER — Telehealth: Payer: Self-pay | Admitting: Internal Medicine

## 2013-08-20 DIAGNOSIS — N926 Irregular menstruation, unspecified: Secondary | ICD-10-CM

## 2013-08-20 NOTE — Telephone Encounter (Signed)
Please advise 

## 2013-08-20 NOTE — Telephone Encounter (Signed)
Pt left vm.  States she has appt 3/16 for irregular periods and a cyst near pelvic bone.  Asking if she can cancel the appt and just be referred to Ob/Gyn.  States she would prefer a female, but will see anyone that Dr. Darrick Huntsmanullo highly recommends.   Returned pt call.  Advised pt that we will not cancel the appt 3/16 until we know that a referral has been placed.  Advised pt we will call her with Dr. Melina Schoolsullo's instructions regarding the referral, or being seen here in office prior to referral being placed.  Pt agrees.

## 2013-08-20 NOTE — Telephone Encounter (Signed)
Referral is in process as requested   To Dr Valentino Saxonherry at [facility]

## 2013-08-20 NOTE — Telephone Encounter (Signed)
Left message, notifying pt that referral is in process and that our patient care coordinator will call her when appointment is scheduled.

## 2013-08-26 ENCOUNTER — Ambulatory Visit: Payer: BC Managed Care – PPO | Admitting: Internal Medicine

## 2013-09-21 ENCOUNTER — Other Ambulatory Visit: Payer: Self-pay | Admitting: Internal Medicine

## 2014-02-19 DIAGNOSIS — H0259 Other disorders affecting eyelid function: Secondary | ICD-10-CM | POA: Insufficient documentation

## 2014-02-19 DIAGNOSIS — H02029 Mechanical entropion of unspecified eye, unspecified eyelid: Secondary | ICD-10-CM | POA: Insufficient documentation

## 2014-04-30 ENCOUNTER — Telehealth: Payer: Self-pay

## 2014-04-30 NOTE — Telephone Encounter (Signed)
Kathy please handle,  I do not call in antibiotics .  This is what  I recommend for "sinus infections"   Lavage your sinuses twice daly with Simply saline nasal spray.  Use benadryl 25 mg every 8 hours and Sudafed PE 10 to 30 mg  every 8 hours to manage the drainage and congestion.  Gargle with salt water often for the sore throat.  Deslym  for the cough.  If the throat is no better  In 3 to 4 days OR  if you develop T > 100.4,  Green nasal discharge,  Or facial pain,  We will work you in on Monday

## 2014-04-30 NOTE — Telephone Encounter (Signed)
Patient notified and voiced understanding.

## 2014-04-30 NOTE — Telephone Encounter (Signed)
The patient called and stated she believes she has a sinus infection.  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 She is hoping a z-pack can be called in to help with her symptoms.   Callback - [phone]

## 2014-12-01 ENCOUNTER — Encounter: Payer: Self-pay | Admitting: Psychiatry

## 2014-12-01 ENCOUNTER — Telehealth: Payer: Self-pay | Admitting: Psychiatry

## 2014-12-01 MED ORDER — VENLAFAXINE HCL 75 MG PO TABS: 75.0000 mg | ORAL_TABLET | Freq: Two times a day (BID) | ORAL | Status: DC

## 2014-12-01 NOTE — Telephone Encounter (Signed)
Patient contacted Clinical research associate requesting that we increase her Effexor from 50 mg twice a day to 75 mg twice a day. She indicates she felt this might oh per with her anxiety. She states we discussed this at the last appointment and she had made a decision to try to wait a little while however she feels that at this point after she has waited since her appointment on 11/05/2014 that she does need a higher dose of Effexor to help her with anxiety. I discussed with her to deal Marliss Czar we do not want to make a lot of adjustments over the telephone however she indicated she had even had this thought at the last appointment and wanted to give the medication some more time at the previous dose. Thus we'll increase her from Effexor 50 mg twice a day to 75 g twice a day. Patient is instructed to call with any questions or concerns. She has already scheduled follow-up.

## 2014-12-05 ENCOUNTER — Other Ambulatory Visit: Payer: Self-pay

## 2014-12-05 DIAGNOSIS — Z6841 Body Mass Index (BMI) 40.0 and over, adult: Principal | ICD-10-CM

## 2014-12-05 DIAGNOSIS — N920 Excessive and frequent menstruation with regular cycle: Secondary | ICD-10-CM

## 2014-12-05 DIAGNOSIS — F329 Major depressive disorder, single episode, unspecified: Secondary | ICD-10-CM

## 2014-12-05 DIAGNOSIS — F419 Anxiety disorder, unspecified: Secondary | ICD-10-CM

## 2014-12-05 DIAGNOSIS — E282 Polycystic ovarian syndrome: Secondary | ICD-10-CM

## 2014-12-05 DIAGNOSIS — N809 Endometriosis, unspecified: Secondary | ICD-10-CM

## 2014-12-05 DIAGNOSIS — N946 Dysmenorrhea, unspecified: Secondary | ICD-10-CM | POA: Insufficient documentation

## 2014-12-05 DIAGNOSIS — F32A Depression, unspecified: Secondary | ICD-10-CM

## 2014-12-05 NOTE — Telephone Encounter (Signed)
left message with patient that she has one more refill but can not received per  [facility] until [facility].

## 2014-12-05 NOTE — Telephone Encounter (Signed)
pt states that she is going on vacation to [place] and she got all her medication but [place].  [facility] [address]

## 2014-12-05 NOTE — Telephone Encounter (Signed)
per [facility] pt does have one refill left but can not received until 12-08-14.

## 2014-12-05 NOTE — Telephone Encounter (Signed)
Pt calls and states that she needs a refill on her Lupron. Pt states that she received her last 2 injections outside of the office (had friend give her one shot, and the school nurse administer the second injection). Advised pt that it was unacceptable for anyone outside of [facility] to administer her Lupron injection. Advised pt that in order for her to get any refills she needs to come into the office to receive this next injection. Pt states that she will come to the office on Monday for injection as she is leaving for [place] for 3 weeks on Tuesday. Called in 1 refill of Lupron to the [facility] [phone]).

## 2014-12-08 ENCOUNTER — Ambulatory Visit (INDEPENDENT_AMBULATORY_CARE_PROVIDER_SITE_OTHER): Payer: BC Managed Care – PPO

## 2014-12-08 VITALS — BP 130/85 | HR 98 | Ht 63.5 in | Wt 363.3 lb

## 2014-12-08 DIAGNOSIS — N946 Dysmenorrhea, unspecified: Secondary | ICD-10-CM | POA: Diagnosis not present

## 2014-12-08 DIAGNOSIS — N809 Endometriosis, unspecified: Secondary | ICD-10-CM

## 2014-12-08 MED ORDER — LEUPROLIDE ACETATE 3.75 MG IM KIT
3.7500 mg | PACK | Freq: Once | INTRAMUSCULAR | Status: AC
  Administered 2014-12-08: 3.75 mg via INTRAMUSCULAR

## 2014-12-08 NOTE — Patient Instructions (Signed)
Pt to follow up in 4-5 weeks as discussed.

## 2014-12-08 NOTE — Progress Notes (Signed)
After obtaining consent, and per orders of Dr. Valentino Saxonherry, injection of Lupron given by [place] Imer Foxworth. Patient instructed to remain in clinic for 20 minutes afterwards, and to report any adverse reaction to me immediately. Pt tolerated injection well. Stressed to pt the need to have all injections done in office and not to have her friends administer any medications out side of the office. Pt gave verbal understanding. Pt to follow up in 1 month for next injection as well as follow up with Dr.Cherry.

## 2015-01-29 ENCOUNTER — Encounter: Payer: Self-pay | Admitting: Psychiatry

## 2015-01-29 ENCOUNTER — Ambulatory Visit (INDEPENDENT_AMBULATORY_CARE_PROVIDER_SITE_OTHER): Payer: BC Managed Care – PPO | Admitting: Psychiatry

## 2015-01-29 VITALS — BP 124/82 | HR 106 | Temp 98.2°F | Ht 64.0 in | Wt 361.6 lb

## 2015-01-29 DIAGNOSIS — F411 Generalized anxiety disorder: Secondary | ICD-10-CM | POA: Diagnosis not present

## 2015-01-29 DIAGNOSIS — F331 Major depressive disorder, recurrent, moderate: Secondary | ICD-10-CM

## 2015-01-29 DIAGNOSIS — G4700 Insomnia, unspecified: Secondary | ICD-10-CM | POA: Diagnosis not present

## 2015-01-29 MED ORDER — ZOLPIDEM TARTRATE 10 MG PO TABS: 10.0000 mg | ORAL_TABLET | Freq: Every day | ORAL | Status: DC

## 2015-01-29 MED ORDER — VENLAFAXINE HCL 75 MG PO TABS: 75.0000 mg | ORAL_TABLET | Freq: Two times a day (BID) | ORAL | Status: DC

## 2015-01-29 MED ORDER — LAMOTRIGINE 150 MG PO TABS: 150.0000 mg | ORAL_TABLET | Freq: Every day | ORAL | Status: DC

## 2015-01-29 NOTE — Progress Notes (Signed)
BH MD/PA/NP OP Progress Note  01/29/2015 3:38 PM Linda Ortiz  MRN:  [id number]  Subjective:  Patient returns for follow-up of her major depressive disorder, recurrent moderate, generalized anxiety disorder and insomnia. She states things continue to go well. She states that should her trip to [place] was gray and she enjoyed it. 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 She states she's been very busy doing some remodeling of her house that involves painting as well as putting in new floors. She states she's been heavily involved and that in fact stated she was so involved she was 45 minutes late for her appointment today. I did explain to her that there are not many patients after her and thus we were able to see her today but that this is not typically the case.  Patient states she is continued on medications and doing well on them. She states she feels like the increase in the venlafaxine to 75 mg twice a day has been helpful. She states the Ambien she takes sparingly. She states sometimes she'll only take 5 mg. 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 She has continued her Lamictal as previous prescribed. Chief Complaint: fine Visit Diagnosis:  No diagnosis found.  Past Medical History:  Past Medical History  Diagnosis Date  . Anxiety   . PCOS (polycystic ovarian syndrome)   . Endometriosis   . Morbid obesity   . Menorrhagia   . Dysmenorrhea     Past Surgical History  Procedure Laterality Date  . Bariatric surgery  2007  . Arthroscopy knee w/ drilling    . Appendectomy  2005   Family History:  Family History  Problem Relation Age of Onset  . Arthritis Mother     rheumatoid  . Hypertension Mother   . Hyperlipidemia Father   . Mental illness Maternal Grandmother     dementia  . Alcohol abuse  Maternal Grandmother   . ALS Paternal Grandmother    Social History:  Social History   Social History  . Marital Status: Single    Spouse Name: N/A  . Number of Children: N/A  . Years of Education: N/A   Social History Main Topics  . Smoking status: Current Every Day Smoker -- 0.25 packs/day for 8 years    Types: Cigarettes  . Smokeless tobacco: Never Used  . Alcohol Use: No  . Drug Use: No  . Sexual Activity: Yes   Other Topics Concern  . Not on file   Social History Narrative   Additional History:   Assessment:   Musculoskeletal: Strength & Muscle Tone: within normal limits Gait & Station: normal Patient leans: N/A  Psychiatric Specialty Exam: HPI  Review of Systems  Psychiatric/Behavioral: Negative for depression, suicidal ideas, hallucinations, memory loss and substance abuse. The patient is not nervous/anxious and does not have insomnia.     There were no vitals taken for this visit.There is no weight on file to calculate BMI.  General Appearance: Fairly Groomed  Eye Contact:  Good  Speech:  Normal Rate  Volume:  Normal  Mood:  Good  Affect:  bright, smiling able to laugh  Thought Process:  Linear and Logical  Orientation:  Full (Time, Place, and Person)  Thought Content:  Negative  Suicidal Thoughts:  No  Homicidal Thoughts:  No  Memory:  Immediate;   Good Recent;   Good Remote;   Good  Judgement:  Good  Insight:  Good  Psychomotor Activity:  Negative  Concentration:  Good  Recall:  Good  Fund of Knowledge: Good  Language: Good  Akathisia:  Negative  Handed:  Right unknown   AIMS (if indicated):  N/A  Assets:  Communication Skills Desire for Improvement Vocational/Educational  ADL's:  Intact  Cognition: WNL  Sleep:  Good   Is the patient at risk to self?  No. Has the patient been a risk to self in the past 6 months?  No. Has the patient been a risk to self within the distant past?  No. Is the patient a risk to others?  No. Has the  patient been a risk to others in the past 6 months?  No. Has the patient been a risk to others within the distant past?  No.  Current Medications: Current Outpatient Prescriptions  Medication Sig Dispense Refill  . lamoTRIgine (LAMICTAL) 150 MG tablet Take 1 tablet (150 mg total) by mouth daily. 30 tablet 3  . LUPRON DEPOT 3.75 MG injection INJECT 1 KIT INTRAMUSCULARLY ONCE A MONTH  0  . Multiple Vitamins-Minerals (MULTIVITAMIN GUMMIES ADULT PO) Take by mouth.    . venlafaxine (EFFEXOR) 75 MG tablet Take 1 tablet (75 mg total) by mouth 2 (two) times daily. 60 tablet 3  . zolpidem (AMBIEN) 10 MG tablet Take 1 tablet (10 mg total) by mouth at bedtime. 30 tablet 3   No current facility-administered medications for this visit.    Medical Decision Making:  Established Problem, Stable/Improving (1)  Treatment Plan Summary:Medication management and Plan Patient continues to remain stable on her current regimen of family fax seen 75 mg twice daily, Lamictal 150 mg daily and Ambien 5-10 mg at bedtime as needed. patient will follow up in 3 months. She's been encouraged to call with any questions or concerns prior to her next appointment.   Faith Rogue 01/29/2015, 3:38 PM

## 2015-03-06 ENCOUNTER — Telehealth: Payer: Self-pay | Admitting: Obstetrics and Gynecology

## 2015-03-06 NOTE — Telephone Encounter (Signed)
Pts last lupron was 11/2014 given by OG. She did not have her lupron 12/2014. Her sister gave her the lupron in early 01/2015. Pt has cx her 02/2015 appt d/t having eye surgery. Advised pt she needs to keep f/u appt with [facility] to discyss next step. Appt r/s to an earlier date. 03/26/15 at 4:00.

## 2015-03-06 NOTE — Telephone Encounter (Signed)
Pt called and is having eye surgery under a general anethesia, she cancelled her depo inj  B/c she said she didn't feel comfortable getting the inj b/c of surgery and anethesia. She wants to know when it would be safe to get it.

## 2015-03-09 ENCOUNTER — Ambulatory Visit: Payer: Self-pay

## 2015-03-11 ENCOUNTER — Telehealth: Payer: Self-pay | Admitting: Psychiatry

## 2015-03-13 ENCOUNTER — Telehealth: Payer: Self-pay | Admitting: Psychiatry

## 2015-03-13 NOTE — Telephone Encounter (Signed)
Patient contacted the clinic returning writer's phone call. I attempted again to reach the patient reached a voicemail. AW

## 2015-03-26 ENCOUNTER — Encounter: Payer: Self-pay | Admitting: Obstetrics and Gynecology

## 2015-03-26 ENCOUNTER — Telehealth: Payer: Self-pay | Admitting: Obstetrics and Gynecology

## 2015-03-26 ENCOUNTER — Ambulatory Visit (INDEPENDENT_AMBULATORY_CARE_PROVIDER_SITE_OTHER): Payer: BC Managed Care – PPO | Admitting: Obstetrics and Gynecology

## 2015-03-26 VITALS — BP 121/80 | HR 87 | Resp 16 | Ht 64.0 in | Wt 361.8 lb

## 2015-03-26 DIAGNOSIS — N809 Endometriosis, unspecified: Secondary | ICD-10-CM | POA: Diagnosis not present

## 2015-03-26 MED ORDER — LEUPROLIDE ACETATE 3.75 MG IM KIT
3.7500 mg | PACK | Freq: Once | INTRAMUSCULAR | Status: AC
  Administered 2015-03-26: 3.75 mg via INTRAMUSCULAR

## 2015-03-26 NOTE — Telephone Encounter (Signed)
This pt called and said her lupron inj wont be here until tomorrow, she is scheduled to come today for f/u and inj, pt argued that she wasn't supposed to see you just inj but i looked at note and Okie said for f/u and inj. I told pt I would have to look at last note and call her back, sooooo can she come for just inj or can she come only if she has a f/u with you?

## 2015-03-26 NOTE — Telephone Encounter (Signed)
I wanted to see her to make sure she was not having any issues with the medication.  She can still come in for her injection separately if she would like, or she can wait until we schedule her a visit with me to receive at the same time.

## 2015-03-27 NOTE — Progress Notes (Signed)
GYNECOLOGY PROGRESS NOTE  Subjective:    Patient ID: Linda Ortiz, female    DOB: 06/01/1971, 44 y.o.   MRN: [id number]  HPI  Patient is a 44 y.o. G0P0000 female who presents for Lupron injection and f/u of symptoms.  Patient notes that she has minimal symptoms and is overall doing well.  Has 1 more injection left.   The following portions of the patient's history were reviewed and updated as appropriate: allergies, current medications, past family history, past medical history, past social history, past surgical history and problem list.  Review of Systems A comprehensive review of systems was negative except for: Eyes: positive for irritation, redness and recent eye surgery Behavioral/Psych: positive for mood swings   Endocrine: hot flushes  Objective:   Blood pressure 121/80, pulse 87, resp. rate 16, height 5\' 4"  (1.626 m), weight 361 lb 12.8 oz (164.111 kg). General appearance: alert and no distress No exam performed today.   Assessment:   Suspected endometriosis (based on symptomatology), on Lupron  Plan:   Reiterated that side effects were likely due to Lupron.  Patient notes that they are manageable, just wanted to mention.   To f/u in 1 month for final injection (nurse visit).     Advised that effects of Lupron can last 6 months - 2 years after last dose. Can resume Lupron therapy if symptoms return after that time.   Hildred LaserAnika Jakin Pavao, MD [facility]

## 2015-04-08 ENCOUNTER — Ambulatory Visit: Payer: Self-pay | Admitting: Obstetrics and Gynecology

## 2015-04-30 ENCOUNTER — Ambulatory Visit (INDEPENDENT_AMBULATORY_CARE_PROVIDER_SITE_OTHER): Payer: BC Managed Care – PPO

## 2015-04-30 VITALS — BP 139/86 | HR 94 | Wt 362.4 lb

## 2015-04-30 DIAGNOSIS — N809 Endometriosis, unspecified: Secondary | ICD-10-CM | POA: Diagnosis not present

## 2015-04-30 MED ORDER — LEUPROLIDE ACETATE 3.75 MG IM KIT
3.7500 mg | PACK | Freq: Once | INTRAMUSCULAR | Status: AC
  Administered 2015-04-30: 3.75 mg via INTRAMUSCULAR

## 2015-04-30 NOTE — Progress Notes (Signed)
Patient ID: Linda Ortiz, female   DOB: 05/01/1971, 44 y.o.   MRN: [id number] Pt has no undesired  side effects of Lupron. This was her last dose

## 2015-05-13 ENCOUNTER — Telehealth: Payer: Self-pay | Admitting: Obstetrics and Gynecology

## 2015-05-13 NOTE — Telephone Encounter (Signed)
Please advise 

## 2015-05-13 NOTE — Telephone Encounter (Signed)
Patient had an ovarian cyst rupture overnight and is now starting to run a fever and didn't know what she should do. She would like a call back. Thanks

## 2015-05-14 ENCOUNTER — Telehealth: Payer: Self-pay | Admitting: Psychiatry

## 2015-05-14 NOTE — Telephone Encounter (Signed)
Has the patient been seen anywhere or had imaging done that confirmed presence of a cyst? If not, patient can be seen for an ultrasound.  If there was a cyst that has truly ruptured, then treatment is supportive (ibuprofen/tylenol for pain/fevers).  Otherwise she may need to be evaluated for other potential causes of fever based on the presence/absence of any other symptoms.

## 2015-05-14 NOTE — Telephone Encounter (Signed)
Spoke with pt she states that she has experienced several cyst before and is pretty sure that's what happened although she has not had any official diagnosis. Pt states that she has found relief of pain and fever with Motrin. Advised pt that she can continue with Motrin and to call back if symptoms persist or worsen. Pt also states that you gave her a name for the bruising on her ovaries, can you please tell me the name so that I can let her know.

## 2015-05-14 NOTE — Telephone Encounter (Signed)
He shouldn't call this Clinical research associatewriter and left a message yesterday in the afternoon. I return phone call this morning but received a voicemail left my name and contact information. AW

## 2015-05-15 NOTE — Telephone Encounter (Signed)
There is no such condition that I'm aware of for bruising of the ovaries.  I think that she is either speaking of telangiectasia or petechiae (areas of dilated blood vessels on the skin,) which can appear like a bruise or rash (she mentioned this around the region of her left ovary last visit).

## 2015-05-15 NOTE — Telephone Encounter (Signed)
Called pt LM for her explaining the 2 conditions listed below.

## 2015-06-14 HISTORY — PX: EYE SURGERY: SHX253

## 2015-06-26 ENCOUNTER — Other Ambulatory Visit: Payer: Self-pay

## 2015-06-26 MED ORDER — ZOLPIDEM TARTRATE 10 MG PO TABS: 10.0000 mg | ORAL_TABLET | Freq: Every day | ORAL | Status: DC

## 2015-06-26 NOTE — Telephone Encounter (Signed)
pt called wanted to get a refill on ambien. pt last seen on  01-29-15 next appt 07-22-15.

## 2015-06-26 NOTE — Telephone Encounter (Signed)
rx faxed and confirmedRemus Ortiz- ambien 10mg  id# [id number]  order # [id number]

## 2015-06-29 ENCOUNTER — Other Ambulatory Visit: Payer: Self-pay

## 2015-06-29 MED ORDER — VENLAFAXINE HCL 75 MG PO TABS: 75.0000 mg | ORAL_TABLET | Freq: Two times a day (BID) | ORAL | Status: DC

## 2015-06-29 MED ORDER — LAMOTRIGINE 150 MG PO TABS: 150.0000 mg | ORAL_TABLET | Freq: Every day | ORAL | Status: DC

## 2015-06-29 NOTE — Telephone Encounter (Signed)
received a notice for refill on lamotrigine 150mg   and on venlafaxine hcl .  pt last seen on  01-29-15 pt next appt is  07-22-15. pt needs enough medications to last until appt.

## 2015-07-02 ENCOUNTER — Telehealth: Payer: Self-pay

## 2015-07-02 NOTE — Telephone Encounter (Signed)
Spoke with patient and she indicated she's had more anxiety over the past few weeks. She states that she is losing weight and she feels like the weight loss may be affecting her hormones which may in part be increasing her anxiety. She is requesting to increase from 75 mg twice a day of immediate release Effexor to 112.5 mg twice a day. I discussed that it has been some time since I have seen her and asked her if she had cancelled or rescheduled appointments  she indicates she thought she was following up with me on 6 month intervals. I indicated that typically not what I do, per review of my last note and August the plan was a three-month follow-up from that. As such I have indicated the patient could try that increased dose given that she has been on this medication for some time. She can call any questions or concerns. She has follow-up appointment with me on 07/22/2015.

## 2015-07-02 NOTE — Telephone Encounter (Signed)
pt states that she is having major anixety and wants to know if her effoxor can be increased.  pt last seen on  01-29-15 , next appt  07-22-15

## 2015-07-22 ENCOUNTER — Telehealth: Payer: Self-pay | Admitting: Psychiatry

## 2015-07-22 ENCOUNTER — Ambulatory Visit: Payer: BC Managed Care – PPO | Admitting: Psychiatry

## 2015-07-24 ENCOUNTER — Encounter: Payer: Self-pay | Admitting: Psychiatry

## 2015-07-24 ENCOUNTER — Ambulatory Visit (INDEPENDENT_AMBULATORY_CARE_PROVIDER_SITE_OTHER): Payer: BC Managed Care – PPO | Admitting: Psychiatry

## 2015-07-24 VITALS — BP 142/110 | HR 95 | Temp 98.0°F | Ht 64.0 in | Wt 362.4 lb

## 2015-07-24 DIAGNOSIS — G4700 Insomnia, unspecified: Secondary | ICD-10-CM | POA: Diagnosis not present

## 2015-07-24 DIAGNOSIS — F331 Major depressive disorder, recurrent, moderate: Secondary | ICD-10-CM

## 2015-07-24 DIAGNOSIS — F411 Generalized anxiety disorder: Secondary | ICD-10-CM

## 2015-07-24 MED ORDER — LAMOTRIGINE 150 MG PO TABS: 150.0000 mg | ORAL_TABLET | Freq: Every day | ORAL | Status: DC

## 2015-07-24 MED ORDER — VENLAFAXINE HCL 75 MG PO TABS: 75.0000 mg | ORAL_TABLET | Freq: Two times a day (BID) | ORAL | Status: DC

## 2015-07-24 MED ORDER — BUSPIRONE HCL 10 MG PO TABS: ORAL_TABLET | ORAL | Status: DC

## 2015-07-24 MED ORDER — ZOLPIDEM TARTRATE 10 MG PO TABS: 10.0000 mg | ORAL_TABLET | Freq: Every day | ORAL | Status: DC

## 2015-07-24 NOTE — Progress Notes (Signed)
BH MD/PA/NP OP Progress Note  07/24/2015 9:32 AM Linda Ortiz  MRN:  [id number]  Subjective:  Patient returns for follow-up of her major depressive disorder, recurrent moderate, generalized anxiety disorder and insomnia. She presents to the appointment today after having her last visit in August 2016. I spent some time discussing with her that she would need more follow-up and that ultimately her psychiatrist to see her more frequently. Patient disclosed that one of the major issues for her is an expensive co-pay. I told her that I understood her dilemma however when we make adjustments to medications this Probation officer and likely other psychiatrist or going to want to see her face-to-face. I did say that once she is stable on the medication she probably could go several months without being seen. Patient has had a habit of calling and wanting to discuss medication adjustments over the phone however hopefully she understood writers explanation for more frequent follow-up in certain situations.  Patient contacted this writer on January 19th wanting to increase her Effexor regular release, from 75 mg twice a day to 112.5 mg twice a day. However today she states she never did that because she wanted to talk with me. She discussed that right now she is not having issues with depression but mainly her issue is anxiety. She states in the past she used to have these panic episodes that might last a few minutes and occur every hour. She states now it is more a baseline level of anxiety. She recalled that this Probation officer previously mentioned BuSpar and she is interested in trying this medication.  Chief Complaint: anxiety Chief Complaint    Follow-up; Medication Refill; Panic Attack; Anxiety; Depression; Stress; Fatigue    fine Visit Diagnosis:  No diagnosis found.  Past Medical History:  Past Medical History  Diagnosis Date  . Anxiety   . PCOS (polycystic ovarian syndrome)   . Endometriosis   . Morbid obesity  ([place])   . Menorrhagia   . Dysmenorrhea     Past Surgical History  Procedure Laterality Date  . Bariatric surgery  2007  . Arthroscopy knee w/ drilling    . Appendectomy  2005   Family History:  Family History  Problem Relation Age of Onset  . Arthritis Mother     rheumatoid  . Hypertension Mother   . Hyperlipidemia Father   . Mental illness Maternal Grandmother     dementia  . Alcohol abuse Maternal Grandmother   . ALS Paternal Grandmother    Social History:  Social History   Social History  . Marital Status: Single    Spouse Name: N/A  . Number of Children: N/A  . Years of Education: N/A   Social History Main Topics  . Smoking status: Current Every Day Smoker -- 0.25 packs/day for 8 years    Types: Cigarettes  . Smokeless tobacco: Never Used  . Alcohol Use: 5.4 oz/week    8 Glasses of wine, 0 Cans of beer, 1 Shots of liquor per week  . Drug Use: No  . Sexual Activity: Yes   Other Topics Concern  . None   Social History Narrative   Additional History:   Assessment:   Musculoskeletal: Strength & Muscle Tone: within normal limits Gait & Station: normal Patient leans: N/A  Psychiatric Specialty Exam: Anxiety Symptoms include nervous/anxious behavior. Patient reports no insomnia or suicidal ideas.    Depression        Associated symptoms include does not have insomnia and no suicidal ideas.  Past medical history includes anxiety.     Review of Systems  Psychiatric/Behavioral: Negative for depression, suicidal ideas, hallucinations, memory loss and substance abuse. The patient is nervous/anxious. The patient does not have insomnia.   All other systems reviewed and are negative.   Blood pressure 142/110, pulse 95, temperature 98 F (36.7 C), temperature source Tympanic, height 5' 4"  (1.626 m), weight 362 lb 6.4 oz (164.384 kg), SpO2 99 %.Body mass index is 62.18 kg/(m^2).  General Appearance: Fairly Groomed  Eye Contact:  Good  Speech:  Normal Rate   Volume:  Normal  Mood:  Good  Affect:  bright, smiling  Thought Process:  Linear and Logical  Orientation:  Full (Time, Place, and Person)  Thought Content:  Negative  Suicidal Thoughts:  No  Homicidal Thoughts:  No  Memory:  Immediate;   Good Recent;   Good Remote;   Good  Judgement:  Good  Insight:  Good  Psychomotor Activity:  Negative  Concentration:  Good  Recall:  Good  Fund of Knowledge: Good  Language: Good  Akathisia:  Negative  Handed:  Right unknown   AIMS (if indicated):  N/A  Assets:  Communication Skills Desire for Improvement Vocational/Educational  ADL's:  Intact  Cognition: WNL  Sleep:  Good   Is the patient at risk to self?  No. Has the patient been a risk to self in the past 6 months?  No. Has the patient been a risk to self within the distant past?  No. Is the patient a risk to others?  No. Has the patient been a risk to others in the past 6 months?  No. Has the patient been a risk to others within the distant past?  No.  Current Medications: Current Outpatient Prescriptions  Medication Sig Dispense Refill  . clarithromycin (BIAXIN) 250 MG tablet Take by mouth.    . clarithromycin (BIAXIN) 250 MG tablet     . fluconazole (DIFLUCAN) 150 MG tablet     . lamoTRIgine (LAMICTAL) 150 MG tablet Take 1 tablet (150 mg total) by mouth daily. 30 tablet 3  . LUPRON DEPOT 3.75 MG injection INJECT 1 KIT INTRAMUSCULARLY ONCE A MONTH  0  . Multiple Vitamins-Minerals (MULTIVITAMIN GUMMIES ADULT PO) Take by mouth.    . predniSONE (STERAPRED UNI-PAK 21 TAB) 5 MG (21) TBPK tablet     . venlafaxine (EFFEXOR) 75 MG tablet Take 1 tablet (75 mg total) by mouth 2 (two) times daily. 60 tablet 3  . zolpidem (AMBIEN) 10 MG tablet Take 1 tablet (10 mg total) by mouth at bedtime. 30 tablet 3  . busPIRone (BUSPAR) 10 MG tablet Take one half a tablet twice daily for one week then increase to one whole tablet twice daily. 60 tablet 1   No current facility-administered  medications for this visit.    Medical Decision Making:  Established Problem, Stable/Improving (1)  Treatment Plan Summary:Medication management and Plan Patient continues to remain stable on her current regimen of major depressive disorder, recurrent, moderate- continue Effexor 75 mg twice daily, Lamictal 150 mg daily and Ambien 5-10 mg at bedtime as needed.   Generalized anxieties disorder-patient that also she could increase her Effexor however she would prefer to keep that dose as above. Rate and start some BuSpar 5 mg twice a day for 1 week and then she'll go to 10 mg twice a day. Risk and benefits have been discussed in patient's able to consent.  Insomnia-stable patient states that response to Ambien. We'll continue Ambien  10 mg at bedtime. Patient states that sometimes she does take 5 mg and does not take 10 mg.  I have requested the patient come back in 1 month to assess the new medication BuSpar. Patient has been made aware she'll follow with a new psychiatrist in this clinic. She's been encouraged to call with any questions or concerns prior to her next appointment.   Faith Rogue 07/24/2015, 9:32 AM

## 2015-08-20 ENCOUNTER — Telehealth: Payer: Self-pay | Admitting: Obstetrics and Gynecology

## 2015-08-20 NOTE — Telephone Encounter (Signed)
PT CALLED AND WAS PUT ON LUPRON SHOT FOR THE PCOS AND THAT HAS BEEN WORKING GREAT BUT ABOUT 2 DAYS AGO SHE IS HAVING BAD PAIN LOWER ABDOMEN NO BLEEDING, PT HAVING NAUSEA AND DIARRHEA, SO SHE IS NOT SURE OF ITS A BUG OR THE MEDICINE THAT IT DOING THIS, SHE DID HER LAST LUPRON INJECTION WAS IN December..SHE ISNT SURE. PT WOULD LIKE A CALL BACK TO KNOW IF THIS COULD BE STEMING FROM THE LUPRON OR NOT.

## 2015-08-21 NOTE — Telephone Encounter (Signed)
Called pt, no answer. LM advising pt that her nausea and vomiting are not likely stemming from her Lupron injections as she 1. Has not had one since December 2. Has been on then for approx 1 yr and has never had any adverse reaction. Advised pt to call back if she begins bleeding or has any other GYN related symptoms. Advised pt that this is likely a stomach virus and should pass in a few days, advised to stay hydrated.

## 2015-08-25 ENCOUNTER — Emergency Department: Payer: BC Managed Care – PPO

## 2015-08-25 ENCOUNTER — Encounter: Payer: Self-pay | Admitting: Emergency Medicine

## 2015-08-25 ENCOUNTER — Inpatient Hospital Stay
Admission: EM | Admit: 2015-08-25 | Discharge: 2015-08-28 | DRG: 392 | Disposition: A | Discharge status: Home / Self Care | Payer: BC Managed Care – PPO | Attending: Surgery | Admitting: Surgery

## 2015-08-25 DIAGNOSIS — F329 Major depressive disorder, single episode, unspecified: Secondary | ICD-10-CM | POA: Diagnosis present

## 2015-08-25 DIAGNOSIS — F411 Generalized anxiety disorder: Secondary | ICD-10-CM | POA: Diagnosis present

## 2015-08-25 DIAGNOSIS — E559 Vitamin D deficiency, unspecified: Secondary | ICD-10-CM | POA: Diagnosis present

## 2015-08-25 DIAGNOSIS — F17210 Nicotine dependence, cigarettes, uncomplicated: Secondary | ICD-10-CM | POA: Diagnosis present

## 2015-08-25 DIAGNOSIS — E282 Polycystic ovarian syndrome: Secondary | ICD-10-CM | POA: Diagnosis present

## 2015-08-25 DIAGNOSIS — Z9884 Bariatric surgery status: Secondary | ICD-10-CM | POA: Diagnosis not present

## 2015-08-25 DIAGNOSIS — K5732 Diverticulitis of large intestine without perforation or abscess without bleeding: Secondary | ICD-10-CM | POA: Diagnosis present

## 2015-08-25 DIAGNOSIS — N809 Endometriosis, unspecified: Secondary | ICD-10-CM | POA: Diagnosis present

## 2015-08-25 DIAGNOSIS — Z79899 Other long term (current) drug therapy: Secondary | ICD-10-CM | POA: Diagnosis not present

## 2015-08-25 DIAGNOSIS — Z6841 Body Mass Index (BMI) 40.0 and over, adult: Secondary | ICD-10-CM | POA: Diagnosis not present

## 2015-08-25 DIAGNOSIS — K5720 Diverticulitis of large intestine with perforation and abscess without bleeding: Secondary | ICD-10-CM | POA: Diagnosis present

## 2015-08-25 DIAGNOSIS — F419 Anxiety disorder, unspecified: Secondary | ICD-10-CM | POA: Diagnosis present

## 2015-08-25 DIAGNOSIS — R1030 Lower abdominal pain, unspecified: Secondary | ICD-10-CM

## 2015-08-25 LAB — URINALYSIS COMPLETE WITH MICROSCOPIC (ARMC ONLY)
Bacteria, UA: NONE SEEN
SPECIFIC GRAVITY, URINE: 1.02 (ref 1.005–1.030)

## 2015-08-25 LAB — COMPREHENSIVE METABOLIC PANEL
ALT: 26 U/L (ref 14–54)
AST: 26 U/L (ref 15–41)
Albumin: 3.4 g/dL — ABNORMAL LOW (ref 3.5–5.0)
Alkaline Phosphatase: 119 U/L (ref 38–126)
Anion gap: 10 (ref 5–15)
BILIRUBIN TOTAL: 0.5 mg/dL (ref 0.3–1.2)
BUN: 14 mg/dL (ref 6–20)
CALCIUM: 9.1 mg/dL (ref 8.9–10.3)
CO2: 24 mmol/L (ref 22–32)
CREATININE: 1 mg/dL (ref 0.44–1.00)
Chloride: 102 mmol/L (ref 101–111)
Glucose, Bld: 104 mg/dL — ABNORMAL HIGH (ref 65–99)
Potassium: 3.5 mmol/L (ref 3.5–5.1)
Sodium: 136 mmol/L (ref 135–145)
TOTAL PROTEIN: 7.4 g/dL (ref 6.5–8.1)

## 2015-08-25 LAB — CBC WITH DIFFERENTIAL/PLATELET
BASOS ABS: 0.1 10*3/uL (ref 0–0.1)
Basophils Relative: 1 %
EOS PCT: 2 %
Eosinophils Absolute: 0.2 10*3/uL (ref 0–0.7)
HEMATOCRIT: 43.2 % (ref 35.0–47.0)
Hemoglobin: 14.5 g/dL (ref 12.0–16.0)
LYMPHS ABS: 1.5 10*3/uL (ref 1.0–3.6)
LYMPHS PCT: 12 %
MCH: 31.9 pg (ref 26.0–34.0)
MCHC: 33.6 g/dL (ref 32.0–36.0)
MCV: 94.9 fL (ref 80.0–100.0)
MONO ABS: 1 10*3/uL — AB (ref 0.2–0.9)
Monocytes Relative: 8 %
NEUTROS ABS: 9.3 10*3/uL — AB (ref 1.4–6.5)
Neutrophils Relative %: 77 %
PLATELETS: 340 10*3/uL (ref 150–440)
RBC: 4.55 MIL/uL (ref 3.80–5.20)
RDW: 14.1 % (ref 11.5–14.5)
WBC: 11.9 10*3/uL — AB (ref 3.6–11.0)

## 2015-08-25 MED ORDER — ENOXAPARIN SODIUM 40 MG/0.4ML ~~LOC~~ SOLN: 40.0000 mg | SUBCUTANEOUS | Status: DC

## 2015-08-25 MED ORDER — ONDANSETRON HCL 4 MG/2ML IJ SOLN: 4.0000 mg | Freq: Four times a day (QID) | INTRAMUSCULAR | Status: DC | PRN

## 2015-08-25 MED ORDER — ACETAMINOPHEN 650 MG RE SUPP: 650.0000 mg | Freq: Four times a day (QID) | RECTAL | Status: DC | PRN

## 2015-08-25 MED ORDER — ACETAMINOPHEN 500 MG PO TABS
ORAL_TABLET | ORAL | Status: AC
  Filled 2015-08-25: qty 2

## 2015-08-25 MED ORDER — PANTOPRAZOLE SODIUM 40 MG PO TBEC
40.0000 mg | DELAYED_RELEASE_TABLET | Freq: Two times a day (BID) | ORAL | Status: DC
  Administered 2015-08-26 – 2015-08-27 (×4): 40 mg via ORAL
  Filled 2015-08-25 (×5): qty 1

## 2015-08-25 MED ORDER — PHENAZOPYRIDINE HCL 100 MG PO TABS: 100.0000 mg | ORAL_TABLET | Freq: Three times a day (TID) | ORAL | Status: DC

## 2015-08-25 MED ORDER — MORPHINE SULFATE (PF) 4 MG/ML IV SOLN: 4.0000 mg | INTRAVENOUS | Status: DC | PRN

## 2015-08-25 MED ORDER — ENOXAPARIN SODIUM 40 MG/0.4ML ~~LOC~~ SOLN
40.0000 mg | Freq: Two times a day (BID) | SUBCUTANEOUS | Status: DC
  Administered 2015-08-26 – 2015-08-27 (×5): 40 mg via SUBCUTANEOUS
  Filled 2015-08-25 (×5): qty 0.4

## 2015-08-25 MED ORDER — ACETAMINOPHEN 325 MG PO TABS
650.0000 mg | ORAL_TABLET | Freq: Four times a day (QID) | ORAL | Status: DC | PRN
  Administered 2015-08-26: 650 mg via ORAL
  Filled 2015-08-25: qty 2

## 2015-08-25 MED ORDER — PIPERACILLIN-TAZOBACTAM 3.375 G IVPB 30 MIN
3.3750 g | Freq: Once | INTRAVENOUS | Status: AC
  Administered 2015-08-25: 3.375 g via INTRAVENOUS

## 2015-08-25 MED ORDER — LAMOTRIGINE 150 MG PO TABS
150.0000 mg | ORAL_TABLET | Freq: Every day | ORAL | Status: DC
  Filled 2015-08-25: qty 1

## 2015-08-25 MED ORDER — VENLAFAXINE HCL 75 MG PO TABS
75.0000 mg | ORAL_TABLET | Freq: Two times a day (BID) | ORAL | Status: DC
  Administered 2015-08-26 – 2015-08-28 (×6): 75 mg via ORAL
  Filled 2015-08-25 (×8): qty 1

## 2015-08-25 MED ORDER — ACETAMINOPHEN 500 MG PO TABS
1000.0000 mg | ORAL_TABLET | Freq: Once | ORAL | Status: AC
  Administered 2015-08-25: 1000 mg via ORAL

## 2015-08-25 MED ORDER — PIPERACILLIN-TAZOBACTAM 3.375 G IVPB
INTRAVENOUS | Status: AC
  Filled 2015-08-25: qty 50

## 2015-08-25 MED ORDER — LACTATED RINGERS IV SOLN
INTRAVENOUS | Status: DC
  Administered 2015-08-25: 21:00:00 via INTRAVENOUS

## 2015-08-25 MED ORDER — HYDROCODONE-ACETAMINOPHEN 5-325 MG PO TABS
1.0000 | ORAL_TABLET | ORAL | Status: DC | PRN
  Administered 2015-08-26: 1 via ORAL
  Administered 2015-08-27 – 2015-08-28 (×4): 2 via ORAL
  Filled 2015-08-25: qty 1
  Filled 2015-08-25 (×4): qty 2

## 2015-08-25 MED ORDER — KCL IN DEXTROSE-NACL 20-5-0.45 MEQ/L-%-% IV SOLN
INTRAVENOUS | Status: DC
  Administered 2015-08-26 – 2015-08-27 (×3): via INTRAVENOUS
  Filled 2015-08-25 (×4): qty 1000

## 2015-08-25 MED ORDER — SODIUM CHLORIDE 0.9 % IV SOLN
1.0000 g | INTRAVENOUS | Status: DC
  Administered 2015-08-26 – 2015-08-27 (×2): 1 g via INTRAVENOUS
  Filled 2015-08-25 (×3): qty 1

## 2015-08-25 MED ORDER — ONDANSETRON 4 MG PO TBDP
4.0000 mg | ORAL_TABLET | Freq: Four times a day (QID) | ORAL | Status: DC | PRN
  Filled 2015-08-25: qty 1

## 2015-08-25 NOTE — Progress Notes (Signed)
Anticoagulation monitoring(Lovenox):  45yo  ordered Lovenox 40 mg Q24h  Filed Weights   08/25/15 1629  Weight: 301 lb (136.533 kg)    Body mass index is 53.33 kg/(m^2).   Lab Results  Component Value Date   CREATININE 1.00 08/25/2015   CREATININE 0.9 04/26/2013   CREATININE 0.9 07/10/2012   Estimated Creatinine Clearance: 98.5 mL/min (by C-G formula based on Cr of 1). Hemoglobin & Hematocrit     Component Value Date/Time   HGB 14.5 08/25/2015 1632   HCT 43.2 08/25/2015 1632     Per Protocol for Patient with estCrcl> 30 ml/min and BMI > 40, will transition to Lovenox 40 mg Q12h.

## 2015-08-25 NOTE — ED Notes (Signed)
Pt talking on phone.  Family at bedside.  Iv fluids infusing.

## 2015-08-25 NOTE — ED Notes (Signed)
Reports flank pain and bladder spasms, states she was seen at [facility] Friday and had ua that showed uti, states she started macrobid Saturday and cipro on Sunday.  States still not better.

## 2015-08-25 NOTE — ED Provider Notes (Signed)
[facility] Emergency Department Provider Note  ____________________________________________  Time seen: 7:50 PM  I have reviewed the triage vital signs and the nursing notes.   HISTORY  Chief Complaint Flank Pain    HPI Linda Ortiz is a 45 y.o. female who complains of left lower abdominal pain for the past 4 days. She started taking leftover Macrobid at home for one day before going to primary care. They started her on Cipro and Pyridium after urinalysis was consistent convincing for urinary tract infection. However the urine culture did not confirm any positive infection. Despite taking the Cipro, her symptoms have failed to improve over the last few days. She is having nausea and vomiting as well. No diarrhea constipation or bloody stools. No fever or chills. She does have constant moderate intensity left lower abdominal pain that is aching with waxing and waning occasionally severe episodes. No aggravating or alleviating factors.     Past Medical History  Diagnosis Date  . Anxiety   . PCOS (polycystic ovarian syndrome)   . Endometriosis   . Morbid obesity ([place])   . Menorrhagia   . Dysmenorrhea      Patient Active Problem List   Diagnosis Date Noted  . Morbid obesity with BMI of 70 and over, adult ([place]) 12/05/2014  . Menorrhagia 12/05/2014  . Dysmenorrhea 12/05/2014  . PCOS (polycystic ovarian syndrome) 12/05/2014  . Endometriosis 12/05/2014  . Anxiety and depression 12/05/2014  . 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 Routine general medical examination at a health care facility 05/05/2013  . Elevated blood pressure reading without diagnosis of hypertension 05/05/2013  . Pain of both elbows 05/05/2013  . Cyst of joint of ankle or foot 03/23/2013  . Vitamin D deficiency 07/11/2012  . Status post gastric bypass for obesity 07/11/2012  . Generalized anxiety disorder      Past Surgical History   Procedure Laterality Date  . Bariatric surgery  2007  . Arthroscopy knee w/ drilling    . Appendectomy  2005     Current Outpatient Rx  Name  Route  Sig  Dispense  Refill  . busPIRone (BUSPAR) 10 MG tablet      Take one half a tablet twice daily for one week then increase to one whole tablet twice daily.   60 tablet   1   . clarithromycin (BIAXIN) 250 MG tablet               . fluconazole (DIFLUCAN) 150 MG tablet               . lamoTRIgine (LAMICTAL) 150 MG tablet   Oral   Take 1 tablet (150 mg total) by mouth daily.   30 tablet   3   . LUPRON DEPOT 3.75 MG injection      INJECT 1 KIT INTRAMUSCULARLY ONCE A MONTH      0     Dispense as written.   . Multiple Vitamins-Minerals (MULTIVITAMIN GUMMIES ADULT PO)   Oral   Take by mouth.         . predniSONE (STERAPRED UNI-PAK 21 TAB) 5 MG (21) TBPK tablet               . venlafaxine (EFFEXOR) 75 MG tablet   Oral   Take 1 tablet (75 mg total) by mouth 2 (two) times daily.   60 tablet   3   . zolpidem (AMBIEN) 10 MG tablet   Oral  Take 1 tablet (10 mg total) by mouth at bedtime.   30 tablet   3      Allergies Review of patient's allergies indicates no known allergies.   Family History  Problem Relation Age of Onset  . Arthritis Mother     rheumatoid  . Hypertension Mother   . Hyperlipidemia Father   . Mental illness Maternal Grandmother     dementia  . Alcohol abuse Maternal Grandmother   . ALS Paternal Grandmother     Social History Social History  Substance Use Topics  . Smoking status: Current Every Day Smoker -- 0.25 packs/day for 8 years    Types: Cigarettes  . Smokeless tobacco: Never Used  . Alcohol Use: 5.4 oz/week    8 Glasses of wine, 0 Cans of beer, 1 Shots of liquor per week    Review of Systems  Constitutional:   No fever or chills. No weight changes Eyes:   No blurry vision or double vision.  ENT:   No sore throat.  Cardiovascular:   No chest  pain. Respiratory:   No dyspnea or cough. Gastrointestinal:   Positive abdominal pain with vomiting. No diarrhea or constipation..  No BRBPR or melena. Genitourinary:   Negative dysuria, positive urgency.. Musculoskeletal:   Negative for back pain. No joint swelling or pain. Skin:   Negative for rash. Neurological:   Negative for headaches, focal weakness or numbness. Psychiatric:  No anxiety or depression.   Endocrine:  No changes in energy or sleep difficulty.  10-point ROS otherwise negative.  ____________________________________________   PHYSICAL EXAM:  VITAL SIGNS: ED Triage Vitals  Enc Vitals Group     BP 08/25/15 1629 131/84 mmHg     Pulse Rate 08/25/15 1629 87     Resp 08/25/15 1629 20     Temp 08/25/15 1629 98.9 F (37.2 C)     Temp Source 08/25/15 1629 Oral     SpO2 08/25/15 1629 95 %     Weight 08/25/15 1629 301 lb (136.533 kg)     Height 08/25/15 1629 5' 3"  (1.6 m)     Head Cir --      Peak Flow --      Pain Score 08/25/15 1630 6     Pain Loc --      Pain Edu? --      Excl. in [place]? --     Vital signs reviewed, nursing assessments reviewed.   Constitutional:   Alert and oriented. No distress. Uncomfortable.. Morbidly obese Eyes:   No scleral icterus. No conjunctival pallor. PERRL. EOMI ENT   Head:   Normocephalic and atraumatic.   Nose:   No congestion/rhinnorhea. No septal hematoma   Mouth/Throat:   MMM, no pharyngeal erythema. No peritonsillar mass.    Neck:   No stridor. No SubQ emphysema. No meningismus. Hematological/Lymphatic/Immunilogical:   No cervical lymphadenopathy. Cardiovascular:   RRR. Symmetric bilateral radial and DP pulses.  No murmurs.  Respiratory:   Normal respiratory effort without tachypnea nor retractions. Breath sounds are clear and equal bilaterally. No wheezes/rales/rhonchi. Gastrointestinal:   Soft with suprapubic and left lower quadrant tenderness.. Non distended. There is no CVA tenderness.  No rebound, rigidity,  or guarding. Genitourinary:   deferred Musculoskeletal:   Nontender with normal range of motion in all extremities. No joint effusions.  No lower extremity tenderness.  No edema. Neurologic:   Normal speech and language.  CN 2-10 normal. Motor grossly intact. No gross focal neurologic deficits are appreciated.  Skin:  Skin is warm, dry and intact. No rash noted.  No petechiae, purpura, or bullae. Psychiatric:   Mood and affect are normal. ____________________________________________    LABS (pertinent positives/negatives) (all labs ordered are listed, but only abnormal results are displayed) Labs Reviewed  URINALYSIS COMPLETEWITH MICROSCOPIC ([place]) - Abnormal; Notable for the following:    Color, Urine ORANGE (*)    APPearance HAZY (*)    Glucose, UA   (*)    Value: TEST NOT REPORTED DUE TO COLOR INTERFERENCE OF URINE PIGMENT   Bilirubin Urine   (*)    Value: TEST NOT REPORTED DUE TO COLOR INTERFERENCE OF URINE PIGMENT   Ketones, ur   (*)    Value: TEST NOT REPORTED DUE TO COLOR INTERFERENCE OF URINE PIGMENT   Hgb urine dipstick   (*)    Value: TEST NOT REPORTED DUE TO COLOR INTERFERENCE OF URINE PIGMENT   Protein, ur   (*)    Value: TEST NOT REPORTED DUE TO COLOR INTERFERENCE OF URINE PIGMENT   Nitrite   (*)    Value: TEST NOT REPORTED DUE TO COLOR INTERFERENCE OF URINE PIGMENT   Leukocytes, UA   (*)    Value: TEST NOT REPORTED DUE TO COLOR INTERFERENCE OF URINE PIGMENT   Squamous Epithelial / LPF 0-5 (*)    All other components within normal limits  CBC WITH DIFFERENTIAL/PLATELET - Abnormal; Notable for the following:    WBC 11.9 (*)    Neutro Abs 9.3 (*)    Monocytes Absolute 1.0 (*)    All other components within normal limits  COMPREHENSIVE METABOLIC PANEL - Abnormal; Notable for the following:    Glucose, Bld 104 (*)    Albumin 3.4 (*)    All other components within normal limits    ____________________________________________   EKG    ____________________________________________    RADIOLOGY  CT abdomen pelvis reveals diverticulitis of the sigmoid colon with some free air indicating microperforation. No evidence of abscess formation at this time.  ____________________________________________   PROCEDURES   ____________________________________________   INITIAL IMPRESSION / ASSESSMENT AND PLAN / ED COURSE  Pertinent labs & imaging results that were available during my care of the patient were reviewed by me and considered in my medical decision making (see chart for details).  Patient presents with severe lower abdominal pain not improving despite antibiotic therapy for presumed urinary tract infection. Urine culture failed to improve the UTI as well. She is tender in the lower abdomen but more so in the left lower quadrant than in the suprapubic area. No CVA tenderness to suggest pyelonephritis. I therefore proceeded with a CT scan of the abdomen pelvis with revealed diverticulitis with microperforation. Patient is not septic vital signs are unremarkable but she does have a slight elevation of her white blood cell count. I discussed the results with the patient and family, recommending admission to which they agree. We'll place an IV, start maintenance IV fluids, IV Zosyn. I discussed the case with Dr. Pat Patrick of surgery at 9:20 PM who will evaluate the patient for admission.     ____________________________________________   FINAL CLINICAL IMPRESSION(S) / ED DIAGNOSES  Final diagnoses:  Diverticulitis of large intestine with perforation without bleeding  Lower abdominal pain      Carrie Mew, MD 08/25/15 2124

## 2015-08-25 NOTE — H&P (Signed)
Linda Ortiz is a 45 y.o. female  with several day history of abdominal pain.  HPI: She was in her usual state of good health until about 10 days ago when she developed some suprapubic abdominal discomfort. She had some mild nausea without vomiting and some mild diarrhea. She felt that she had contracted a gastrointestinal virus. She denied any fever.  Approximately 72 hours ago she had severe sudden onset of left lower quadrant pain coming by diaphoresis and increased nausea. The pain persisted over the course of 24 hours and she presented to the acute care center. At that point urinalysis demonstrated evidence of possible urinary tract infection. She was placed on antibiotics. Of note, she had some antibiotics from her mother prior to going to the outpatient evaluation. She continued to have pain and fever over the next 24 hours call back to her primary care doctor who recommended evaluation in the emergency room.  She denies any history of hepatitis yellow jaundice pancreatitis peptic ulcer disease or previous diagnosis of diverticulitis. She has had some abdominal discomfort in the past which she has blamed on her polycystic ovarian syndrome. She's had 2 gallbladder attacks but has not undergone any surgery for that problem. She does have a history of morbid obesity and has undergone a gastric bypass almost 10 years ago.  She's also had a previous appendectomy and knee surgery.  Workup in the emergency room revealed a slightly elevated white blood cell count essentially normal liver function studies, and a CT scan performed for kidney stones demonstrating what appears to be a perforated diverticulitis in sigmoid colon. No abscess was noted but there is extraluminal air on the CT scan. Surgical service was consulted.  Past Medical History  Diagnosis Date  . Anxiety   . PCOS (polycystic ovarian syndrome)   . Endometriosis   . Morbid obesity ([facility])   . Menorrhagia   . Dysmenorrhea    Past  Surgical History  Procedure Laterality Date  . Bariatric surgery  2007  . Arthroscopy knee w/ drilling    . Appendectomy  2005   Social History   Social History  . Marital Status: Single    Spouse Name: N/A  . Number of Children: N/A  . Years of Education: N/A   Social History Main Topics  . Smoking status: Current Every Day Smoker -- 0.25 packs/day for 8 years    Types: Cigarettes  . Smokeless tobacco: Never Used  . Alcohol Use: 5.4 oz/week    8 Glasses of wine, 0 Cans of beer, 1 Shots of liquor per week  . Drug Use: No  . Sexual Activity: Yes   Other Topics Concern  . None   Social History Narrative     Review of Systems  Constitutional: Positive for fever, chills, malaise/fatigue and diaphoresis. Negative for weight loss.  Eyes: Negative.   Respiratory: Negative for cough, shortness of breath and wheezing.   Cardiovascular: Negative for chest pain and palpitations.  Gastrointestinal: Positive for nausea, abdominal pain and diarrhea. Negative for heartburn.  Genitourinary: Positive for dysuria and hematuria. Negative for urgency and frequency.  Musculoskeletal: Positive for back pain, joint pain and neck pain.  Skin: Negative.   Neurological: Negative.   Psychiatric/Behavioral: Negative.      PHYSICAL EXAM: BP 131/84 mmHg  Pulse 87  Temp(Src) 98.9 F (37.2 C) (Oral)  Resp 20  Ht  (1.6 m)  Wt 136.533 kg (301 lb)  BMI 53.33 kg/m2  SpO2 95%  LMP  Physical Exam  Constitutional: She is oriented to person, place, and time.  Morbidly obese in no significant distress  HENT:  Head: Normocephalic and atraumatic.  Eyes: EOM are normal. Pupils are equal, round, and reactive to light.  Neck: Normal range of motion. Neck supple.  Cardiovascular: Normal rate and regular rhythm.   Murmur heard. Pulmonary/Chest: Effort normal and breath sounds normal.  Abdominal: Bowel sounds are normal. She exhibits no distension. There is tenderness.  Musculoskeletal: Normal  range of motion. She exhibits no edema or tenderness.  Neurological: She is alert and oriented to person, place, and time.  Skin: Skin is warm and dry.  Psychiatric: Her behavior is normal. Judgment and thought content normal.   Abdomen is soft nondistended with some distinct tenderness in left lower quadrant. She has some mild guarding with no rebound. She has active bowel sounds. I cannot palpate an abdominal hernia although she is quite large.  Impression/Plan: Working diagnosis after independently reviewed the CT scan would be perforated diverticulitis in sigmoid colon. The scan is low-resolution for GI pathology but does appear to show extraluminal air. Because she's been on 2 antibiotics prior to coming to the hospital I'm going to recommend we treat her with Invanz. I discussed risks benefits of operative versus nonoperative intervention. I outlined the possibility of a colostomy in the event of emergency surgical intervention. Her parents were present during the interview and all appear to be in agreement.   Tiney Rougealph Ely III, MD  08/25/2015, 10:15 PM

## 2015-08-25 NOTE — ED Notes (Signed)
Dr Michela PitcherEly in with pt and family.

## 2015-08-25 NOTE — ED Notes (Signed)
Pt reports left lower abd pain.  Pt taking cipro and pyridium for uti.  Pt sent to er for eval and ctscan to rule out kidney stone.  Pt alert.  Family with pt.

## 2015-08-25 NOTE — ED Notes (Signed)
[facility]  Pt taken to room 203 with nurse and er tech.  Iv LR discontinued.

## 2015-08-26 LAB — CREATININE, SERUM
Creatinine, Ser: 0.95 mg/dL (ref 0.44–1.00)
GFR calc Af Amer: >60 mL/min (ref >60)
GFR calc non Af Amer: >60 mL/min (ref >60)

## 2015-08-26 LAB — CBC
HEMATOCRIT: 39.2 % (ref 35.0–47.0)
Hemoglobin: 13.3 g/dL (ref 12.0–16.0)
MCH: 32.9 pg (ref 26.0–34.0)
MCHC: 33.9 g/dL (ref 32.0–36.0)
MCV: 97.2 fL (ref 80.0–100.0)
PLATELETS: 295 10*3/uL (ref 150–440)
RBC: 4.04 MIL/uL (ref 3.80–5.20)
RDW: 13.8 % (ref 11.5–14.5)
WBC: 9.6 10*3/uL (ref 3.6–11.0)

## 2015-08-26 MED ORDER — ZOLPIDEM TARTRATE 5 MG PO TABS
10.0000 mg | ORAL_TABLET | Freq: Every evening | ORAL | Status: DC | PRN
  Administered 2015-08-27 – 2015-08-28 (×2): 10 mg via ORAL
  Filled 2015-08-26 (×2): qty 2

## 2015-08-26 MED ORDER — LAMOTRIGINE 100 MG PO TABS
150.0000 mg | ORAL_TABLET | Freq: Every day | ORAL | Status: DC
  Administered 2015-08-26 – 2015-08-27 (×3): 150 mg via ORAL
  Filled 2015-08-26 (×3): qty 2

## 2015-08-26 MED ORDER — ZOLPIDEM TARTRATE 5 MG PO TABS
5.0000 mg | ORAL_TABLET | Freq: Every evening | ORAL | Status: DC | PRN
  Administered 2015-08-26: 5 mg via ORAL
  Filled 2015-08-26: qty 1

## 2015-08-26 MED ORDER — ACETAMINOPHEN 500 MG PO TABS
1000.0000 mg | ORAL_TABLET | Freq: Four times a day (QID) | ORAL | Status: DC | PRN
Start: 2015-08-26 — End: 2015-08-28
  Administered 2015-08-26 – 2015-08-27 (×2): 1000 mg via ORAL
  Filled 2015-08-26 (×2): qty 2

## 2015-08-26 NOTE — Progress Notes (Signed)
CC: Diverticulitis Subjective: Patient reports already feeling better since admission. She states the pain is much decreased and she's been tolerating her diet without any nausea or vomiting. She's been a the halls without difficulty.  Objective: Vital signs in last 24 hours: Temp:  [97.6 F (36.4 C)-98.2 F (36.8 C)] 97.6 F (36.4 C) (03/15 0545) Pulse Rate:  [71-77] 71 (03/15 1254) Resp:  [18] 18 (03/15 1254) BP: (123-143)/(65-89) 129/89 mmHg (03/15 1254) SpO2:  [97 %-98 %] 98 % (03/15 1254) Last BM Date: 08/26/15  Intake/Output from previous day: 03/14 0701 - 03/15 0700 In: 316.5 [I.V.:316.5] Out: -  Intake/Output this shift: Total I/O In: 1015 [P.O.:240; I.V.:775] Out: -   Physical exam:  Gen.: No acute distress  chest: Clear to auscultation Heart: Regular rate and rhythm Abdomen: Large, soft, minimally tender to palpation left lower quadrant.  Lab Results: CBC   Recent Labs  08/25/15 1632 08/26/15 0224  WBC 11.9* 9.6  HGB 14.5 13.3  HCT 43.2 39.2  PLT 340 295   BMET  Recent Labs  08/25/15 1632 08/26/15 0224  NA 136  --   K 3.5  --   CL 102  --   CO2 24  --   GLUCOSE 104*  --   BUN 14  --   CREATININE 1.00 0.95  CALCIUM 9.1  --    PT/INR No results for input(s): LABPROT, INR in the last 72 hours. ABG No results for input(s): PHART, HCO3 in the last 72 hours.  Invalid input(s): PCO2, PO2  Studies/Results: Ct Renal Stone Study  08/25/2015  CLINICAL DATA:  Acute onset of bilateral flank pain and bladder spasms. Initial encounter. EXAM: CT ABDOMEN AND PELVIS WITHOUT CONTRAST TECHNIQUE: Multidetector CT imaging of the abdomen and pelvis was performed following the standard protocol without IV contrast. COMPARISON:  Abdominal ultrasound performed 03/01/2007 FINDINGS: The visualized lung bases are clear. Postoperative change is noted about the stomach. The liver and spleen are unremarkable in appearance. The gallbladder is within normal limits. The  pancreas and adrenal glands are unremarkable. The kidneys are unremarkable in appearance. There is no evidence of hydronephrosis. No renal or ureteral stones are seen. No perinephric stranding is appreciated. No free fluid is identified. The small bowel is unremarkable in appearance. The stomach is within normal limits. No acute vascular abnormalities are seen. The patient is status post appendectomy. Focal wall thickening is noted at the proximal sigmoid colon, with associated soft tissue inflammation extending proximally along the mesentery. Scattered small foci of air are noted within the mesentery, compatible with focal microperforation. Trace associated free fluid is noted. Underlying diverticulosis is seen. The bladder is mildly distended and grossly unremarkable. The uterus is unremarkable. The ovaries are grossly symmetric, aside from mild soft tissue inflammation about the left ovary due to the adjacent diverticulitis. No inguinal lymphadenopathy is seen. No acute osseous abnormalities are identified. Multilevel vacuum phenomenon is noted along the lower thoracic and lumbar spine. IMPRESSION: 1. Acute diverticulitis at the proximal sigmoid colon, with soft tissue inflammation and focal wall thickening. Scattered small foci of air noted within the mesentery, compatible with focal microperforation. Trace free fluid noted. 2. Minimal degenerative change along the lower thoracic and lumbar spine. These results were called by telephone at the time of interpretation on 08/25/2015 at 8:54 pm to Dr. Sharman CheekPHILLIP STAFFORD, who verbally acknowledged these results. Electronically Signed   By: Roanna RaiderJeffery  Chang M.D.   On: 08/25/2015 20:56    Anti-infectives: Anti-infectives    Start  Dose/Rate Route Frequency Ordered Stop   08/26/15 0000  ertapenem (INVANZ) 1 g in sodium chloride 0.9 % 50 mL IVPB     1 g 100 mL/hr over 30 Minutes Intravenous Every 24 hours 08/25/15 2214     08/25/15 2130  piperacillin-tazobactam  (ZOSYN) IVPB 3.375 g     3.375 g 100 mL/hr over 30 Minutes Intravenous  Once 08/25/15 2117 08/25/15 2214      Assessment/Plan:  45 year old female with ruptured diverticulitis. Appears to already be responding to IV antibiotics. Plan to trend her labs and her exam 1 more day with anticipation of transitioning to oral antibiotics tomorrow. Patient requesting nutrition labs to be obtained secondary to her gastric bypass status. Encourage ambulation and incentive from her usage.  Arvetta Araque T. Tonita Cong, MD, FACS  08/26/2015

## 2015-08-26 NOTE — Progress Notes (Signed)
Initial Nutrition Assessment     INTERVENTION:  Meals and snacks: Cater to pt preferences, encouraged low fiber food choices at this time Nutrition diet education: Discussed low fiber foods and high fiber diet as maintenance diet after acute episode.  Materials provided to pt and family.    NUTRITION DIAGNOSIS:   Inadequate oral intake related to altered GI function as evidenced by per patient/family report.    GOAL:   Patient will meet greater than or equal to 90% of their needs    MONITOR:    (Energy intake, Digestive system)  REASON FOR ASSESSMENT:   Malnutrition Screening Tool    ASSESSMENT:      Pt admitted with perforated diverticulitis in sigmoid colon.  Noted per MD noted 10 day of abdominal pain prior to admission  Past Medical History  Diagnosis Date  . Anxiety   . PCOS (polycystic ovarian syndrome)   . Endometriosis   . Morbid obesity ([facility])   . Menorrhagia   . Dysmenorrhea     Current Nutrition: tolerated breakfast this am  Food/Nutrition-Related History: pt reports decreased intake for the past week.  Mainly eating liquids for the past week (soups, jello, applesauce)   Scheduled Medications:  . enoxaparin (LOVENOX) injection  40 mg Subcutaneous Q12H  . ertapenem  1 g Intravenous Q24H  . lamoTRIgine  150 mg Oral Daily  . pantoprazole  40 mg Oral BID  . phenazopyridine  100 mg Oral TID WC  . venlafaxine  75 mg Oral BID    Continuous Medications:  . dextrose 5 % and 0.45 % NaCl with KCl 20 mEq/L 75 mL/hr at 08/26/15 0128     Electrolyte/Renal Profile and Glucose Profile:   Recent Labs Lab 08/25/15 1632 08/26/15 0224  NA 136  --   K 3.5  --   CL 102  --   CO2 24  --   BUN 14  --   CREATININE 1.00 0.95  CALCIUM 9.1  --   GLUCOSE 104*  --    Protein Profile:  Recent Labs Lab 08/25/15 1632  ALBUMIN 3.4*    Gastrointestinal Profile: Last BM: 3/14   Nutrition-Focused Physical Exam Findings: Nutrition-Focused physical exam  completed. Findings are no fat depletion, no muscle depletion, and no edema.     Weight Change: 4% wt loss in the last month per pt report    Diet Order:  Diet Carb Modified Fluid consistency:: Thin; Room service appropriate?: Yes  Skin:   reviewed  Height:   Ht Readings from Last 1 Encounters:  08/25/15 5\' 3"  (1.6 m)    Weight:   Wt Readings from Last 1 Encounters:  08/25/15 301 lb (136.533 kg)    Ideal Body Weight:     BMI:  Body mass index is 53.33 kg/(m^2).  Estimated Nutritional Needs:   Kcal:  BEE 1144 kcals (IF 1.1-1.3, AF 1.3) [id number] kcals/d Using IBW 52kg  Protein:  (1.0-1.2 g/kg) 52-62 g/d  Fluid:  (30-3135ml/kg) 1560-183920ml/d  EDUCATION NEEDS:   Education needs addressed  MODERATE Care Level  Brea Coleson B. Freida BusmanAllen, RD, LDN [phone] (pager) Weekend/On-Call pager [phone])

## 2015-08-26 NOTE — Progress Notes (Signed)
Pt requests concerns about being discharged without having a comparative abdominal CT scan.  Pt would like to request a comparative CT scan before being discharged.

## 2015-08-27 LAB — BASIC METABOLIC PANEL
ANION GAP: 6 (ref 5–15)
BUN: 12 mg/dL (ref 6–20)
CO2: 27 mmol/L (ref 22–32)
Calcium: 8.4 mg/dL — ABNORMAL LOW (ref 8.9–10.3)
Chloride: 103 mmol/L (ref 101–111)
Creatinine, Ser: 0.82 mg/dL (ref 0.44–1.00)
Glucose, Bld: 104 mg/dL — ABNORMAL HIGH (ref 65–99)
POTASSIUM: 4 mmol/L (ref 3.5–5.1)
SODIUM: 136 mmol/L (ref 135–145)

## 2015-08-27 LAB — CBC
HCT: 39.4 % (ref 35.0–47.0)
Hemoglobin: 13.3 g/dL (ref 12.0–16.0)
MCH: 32.7 pg (ref 26.0–34.0)
MCHC: 33.9 g/dL (ref 32.0–36.0)
MCV: 96.4 fL (ref 80.0–100.0)
PLATELETS: 311 10*3/uL (ref 150–440)
RBC: 4.08 MIL/uL (ref 3.80–5.20)
RDW: 14 % (ref 11.5–14.5)
WBC: 8.5 10*3/uL (ref 3.6–11.0)

## 2015-08-27 LAB — VITAMIN B12: VITAMIN B 12: 194 pg/mL (ref 180–914)

## 2015-08-27 MED ORDER — METRONIDAZOLE 500 MG PO TABS
500.0000 mg | ORAL_TABLET | Freq: Three times a day (TID) | ORAL | Status: DC
  Administered 2015-08-27 – 2015-08-28 (×3): 500 mg via ORAL
  Filled 2015-08-27 (×3): qty 1

## 2015-08-27 MED ORDER — CIPROFLOXACIN HCL 500 MG PO TABS
500.0000 mg | ORAL_TABLET | Freq: Two times a day (BID) | ORAL | Status: DC
  Administered 2015-08-27 – 2015-08-28 (×3): 500 mg via ORAL
  Filled 2015-08-27 (×3): qty 1

## 2015-08-27 NOTE — Progress Notes (Signed)
CC: Diverticulitis Subjective: Patient reports that her pain is much improved. She's been tolerating a diet and having bowel function. She is ambulating with ease.  Objective: Vital signs in last 24 hours: Temp:  [97.4 F (36.3 C)-98.5 F (36.9 C)] 97.4 F (36.3 C) (03/16 1237) Pulse Rate:  [66-81] 66 (03/16 1237) Resp:  [18-20] 18 (03/16 1237) BP: (118-148)/(78-91) 142/91 mmHg (03/16 1237) SpO2:  [97 %-98 %] 98 % (03/16 1237) Last BM Date: 08/26/15  Intake/Output from previous day: 03/15 0701 - 03/16 0700 In: 1395 [P.O.:480; I.V.:915] Out: -  Intake/Output this shift:    Physical exam:  Gen.: No acute distress  chest: Clear to auscultation Heart: Regular rhythm Abdomen: Large, soft, minimally tender to deep palpation in the left lower quadrant  Lab Results: CBC   Recent Labs  08/26/15 0224 08/27/15 0522  WBC 9.6 8.5  HGB 13.3 13.3  HCT 39.2 39.4  PLT 295 311   BMET  Recent Labs  08/25/15 1632 08/26/15 0224 08/27/15 0522  NA 136  --  136  K 3.5  --  4.0  CL 102  --  103  CO2 24  --  27  GLUCOSE 104*  --  104*  BUN 14  --  12  CREATININE 1.00 0.95 0.82  CALCIUM 9.1  --  8.4*   PT/INR No results for input(s): LABPROT, INR in the last 72 hours. ABG No results for input(s): PHART, HCO3 in the last 72 hours.  Invalid input(s): PCO2, PO2  Studies/Results: Ct Renal Stone Study  08/25/2015  CLINICAL DATA:  Acute onset of bilateral flank pain and bladder spasms. Initial encounter. EXAM: CT ABDOMEN AND PELVIS WITHOUT CONTRAST TECHNIQUE: Multidetector CT imaging of the abdomen and pelvis was performed following the standard protocol without IV contrast. COMPARISON:  Abdominal ultrasound performed 03/01/2007 FINDINGS: The visualized lung bases are clear. Postoperative change is noted about the stomach. The liver and spleen are unremarkable in appearance. The gallbladder is within normal limits. The pancreas and adrenal glands are unremarkable. The kidneys are  unremarkable in appearance. There is no evidence of hydronephrosis. No renal or ureteral stones are seen. No perinephric stranding is appreciated. No free fluid is identified. The small bowel is unremarkable in appearance. The stomach is within normal limits. No acute vascular abnormalities are seen. The patient is status post appendectomy. Focal wall thickening is noted at the proximal sigmoid colon, with associated soft tissue inflammation extending proximally along the mesentery. Scattered small foci of air are noted within the mesentery, compatible with focal microperforation. Trace associated free fluid is noted. Underlying diverticulosis is seen. The bladder is mildly distended and grossly unremarkable. The uterus is unremarkable. The ovaries are grossly symmetric, aside from mild soft tissue inflammation about the left ovary due to the adjacent diverticulitis. No inguinal lymphadenopathy is seen. No acute osseous abnormalities are identified. Multilevel vacuum phenomenon is noted along the lower thoracic and lumbar spine. IMPRESSION: 1. Acute diverticulitis at the proximal sigmoid colon, with soft tissue inflammation and focal wall thickening. Scattered small foci of air noted within the mesentery, compatible with focal microperforation. Trace free fluid noted. 2. Minimal degenerative change along the lower thoracic and lumbar spine. These results were called by telephone at the time of interpretation on 08/25/2015 at 8:54 pm to Dr. Sharman Cheek, who verbally acknowledged these results. Electronically Signed   By: Roanna Raider M.D.   On: 08/25/2015 20:56    Anti-infectives: Anti-infectives    Start     Dose/Rate Route  Frequency Ordered Stop   08/27/15 1400  metroNIDAZOLE (FLAGYL) tablet 500 mg     500 mg Oral 3 times per day 08/27/15 1151     08/27/15 1200  ciprofloxacin (CIPRO) tablet 500 mg     500 mg Oral 2 times daily 08/27/15 1151     08/26/15 0000  ertapenem (INVANZ) 1 g in sodium  chloride 0.9 % 50 mL IVPB  Status:  Discontinued     1 g 100 mL/hr over 30 Minutes Intravenous Every 24 hours 08/25/15 2214 08/27/15 1151   08/25/15 2130  piperacillin-tazobactam (ZOSYN) IVPB 3.375 g     3.375 g 100 mL/hr over 30 Minutes Intravenous  Once 08/25/15 2117 08/25/15 2214      Assessment/Plan:  45 year old female with sigmoid diverticulitis. Her labs have normalized and her pain has improved on IV antibiotic. Plan to transition to oral antibiotics today and confirm continued improvement on oral antibiotics prior discharge likely tomorrow. Encourage ambulation, incentive spirometer usage and avoiding prolonged laying in bed.  Jimie Kuwahara T. Tonita CongWoodham, MD, FACS  08/27/2015

## 2015-08-28 LAB — VITAMIN D 25 HYDROXY (VIT D DEFICIENCY, FRACTURES): Vit D, 25-Hydroxy: 8.6 ng/mL — ABNORMAL LOW (ref 30.0–100.0)

## 2015-08-28 MED ORDER — METRONIDAZOLE 500 MG PO TABS: 500.0000 mg | ORAL_TABLET | Freq: Three times a day (TID) | ORAL | Status: DC

## 2015-08-28 MED ORDER — HYDROCODONE-ACETAMINOPHEN 5-325 MG PO TABS: 1.0000 | ORAL_TABLET | ORAL | Status: DC | PRN

## 2015-08-28 MED ORDER — CIPROFLOXACIN HCL 500 MG PO TABS: 500.0000 mg | ORAL_TABLET | Freq: Two times a day (BID) | ORAL | Status: DC

## 2015-08-28 MED ORDER — FLUCONAZOLE 200 MG PO TABS: 200.0000 mg | ORAL_TABLET | Freq: Every day | ORAL | Status: DC

## 2015-08-28 NOTE — Progress Notes (Signed)
Pt d/c to home today.  Rx's given to pt w/all questions and concerns addressed.  D/C paperwork reviewed and education provided with all questions and concerns addressed.  Pt family at bedside for home transport.  Volunteer services contact for transportation from room to exit.

## 2015-08-28 NOTE — Discharge Instructions (Signed)
Diverticulitis °Diverticulitis is inflammation or infection of small pouches in your colon that form when you have a condition called diverticulosis. The pouches in your colon are called diverticula. Your colon, or large intestine, is where water is absorbed and stool is formed. °Complications of diverticulitis can include: °· Bleeding. °· Severe infection. °· Severe pain. °· Perforation of your colon. °· Obstruction of your colon. °CAUSES  °Diverticulitis is caused by bacteria. °Diverticulitis happens when stool becomes trapped in diverticula. This allows bacteria to grow in the diverticula, which can lead to inflammation and infection. °RISK FACTORS °People with diverticulosis are at risk for diverticulitis. Eating a diet that does not include enough fiber from fruits and vegetables may make diverticulitis more likely to develop. °SYMPTOMS  °Symptoms of diverticulitis may include: °· Abdominal pain and tenderness. The pain is normally located on the left side of the abdomen, but may occur in other areas. °· Fever and chills. °· Bloating. °· Cramping. °· Nausea. °· Vomiting. °· Constipation. °· Diarrhea. °· Blood in your stool. °DIAGNOSIS  °Your health care provider will ask you about your medical history and do a physical exam. You may need to have tests done because many medical conditions can cause the same symptoms as diverticulitis. Tests may include: °· Blood tests. °· Urine tests. °· Imaging tests of the abdomen, including X-rays and CT scans. °When your condition is under control, your health care provider may recommend that you have a colonoscopy. A colonoscopy can show how severe your diverticula are and whether something else is causing your symptoms. °TREATMENT  °Most cases of diverticulitis are mild and can be treated at home. Treatment may include: °· Taking over-the-counter pain medicines. °· Following a clear liquid diet. °· Taking antibiotic medicines by mouth for 7-10 days. °More severe cases may  be treated at a hospital. Treatment may include: °· Not eating or drinking. °· Taking prescription pain medicine. °· Receiving antibiotic medicines through an IV tube. °· Receiving fluids and nutrition through an IV tube. °· Surgery. °HOME CARE INSTRUCTIONS  °· Follow your health care provider's instructions carefully. °· Follow a full liquid diet or other diet as directed by your health care provider. After your symptoms improve, your health care provider may tell you to change your diet. He or she may recommend you eat a high-fiber diet. Fruits and vegetables are good sources of fiber. Fiber makes it easier to pass stool. °· Take fiber supplements or probiotics as directed by your health care provider. °· Only take medicines as directed by your health care provider. °· Keep all your follow-up appointments. °SEEK MEDICAL CARE IF:  °· Your pain does not improve. °· You have a hard time eating food. °· Your bowel movements do not return to normal. °SEEK IMMEDIATE MEDICAL CARE IF:  °· Your pain becomes worse. °· Your symptoms do not get better. °· Your symptoms suddenly get worse. °· You have a fever. °· You have repeated vomiting. °· You have bloody or black, tarry stools. °MAKE SURE YOU:  °· Understand these instructions. °· Will watch your condition. °· Will get help right away if you are not doing well or get worse. °  °This information is not intended to replace advice given to you by your health care provider. Make sure you discuss any questions you have with your health care provider. °  °Document Released: 03/09/2005 Document Revised: 06/04/2013 Document Reviewed: 04/24/2013 °Elsevier Interactive Patient Education ©2016 [employer] ° °

## 2015-08-28 NOTE — Discharge Summary (Signed)
Patient ID: Linda Ortiz MRN: [id number] DOB/AGE: 43/12/1970 43 y.o.  Admit date: 08/25/2015 Discharge date: 08/28/2015  Discharge Diagnoses:  Diverticulitis  Procedures Performed: None  Discharged Condition: good  Hospital Course: 45 year old female admitted with diverticulitis with a contained rupture. Responded well to IV antibiotics. Was able to tolerate oral antibiotics with continued resolution of symptoms prior to discharge.  Discharge Orders:  discharge home, follow up with surgery in 2 weeks. Will need colonoscopy in 6-8 weeks.  Disposition: Home  Discharge Medications:    Medication List    TAKE these medications        ciprofloxacin 500 MG tablet  Commonly known as:  CIPRO  Take 1 tablet by mouth 2 (two) times daily.     ciprofloxacin 500 MG tablet  Commonly known as:  CIPRO  Take 1 tablet (500 mg total) by mouth 2 (two) times daily.     fluconazole 200 MG tablet  Commonly known as:  DIFLUCAN  Take 1 tablet (200 mg total) by mouth daily.     HYDROcodone-acetaminophen 5-325 MG tablet  Commonly known as:  NORCO/VICODIN  Take 1-2 tablets by mouth every 4 (four) hours as needed for moderate pain.     lamoTRIgine 150 MG tablet  Commonly known as:  LAMICTAL  Take 1 tablet (150 mg total) by mouth daily.     metroNIDAZOLE 500 MG tablet  Commonly known as:  FLAGYL  Take 1 tablet (500 mg total) by mouth every 8 (eight) hours.     phenazopyridine 100 MG tablet  Commonly known as:  PYRIDIUM  Take 1 tablet by mouth 3 (three) times daily as needed for pain.     promethazine 25 MG tablet  Commonly known as:  PHENERGAN  Take 1 tablet by mouth every 6 (six) hours as needed for vomiting.     venlafaxine 75 MG tablet  Commonly known as:  EFFEXOR  Take 1 tablet (75 mg total) by mouth 2 (two) times daily.     zolpidem 10 MG tablet  Commonly known as:  AMBIEN  Take 1 tablet (10 mg total) by mouth at bedtime.         Follwup: Follow-up Information    [facility] [place]. Schedule an appointment as soon as possible for a visit in 2 weeks.   Specialty:  General Surgery   Why:  diverticulitis   Contact information:   [address], [address] [place] [id number] [phone]      Signed: Ricarda FrameCharles Unique Sillas 08/28/2015, 1:04 PM

## 2015-08-28 NOTE — Final Progress Note (Signed)
CC: Diverticulitis Subjective: Patient states her pain is mostly resolved and has no active complaints today.  Objective: Vital signs in last 24 hours: Temp:  [97.6 F (36.4 C)-97.7 F (36.5 C)] 97.6 F (36.4 C) (03/17 0526) Pulse Rate:  [77-79] 79 (03/17 0526) Resp:  [20] 20 (03/17 0526) BP: (126-134)/(73-88) 126/73 mmHg (03/17 0526) SpO2:  [98 %] 98 % (03/17 0526) Last BM Date: 08/28/15  Intake/Output from previous day:   Intake/Output this shift: Total I/O In: 400 [P.O.:400] Out: -   Physical exam:  Gen.: No acute distress  chest: Clear to auscultation Heart: Regular rate and rhythm Abdomen: Soft, nondistended, minimally tender to extremes and deep palpation in the left lower quadrant  Lab Results: CBC   Recent Labs  08/26/15 0224 08/27/15 0522  WBC 9.6 8.5  HGB 13.3 13.3  HCT 39.2 39.4  PLT 295 311   BMET  Recent Labs  08/25/15 1632 08/26/15 0224 08/27/15 0522  NA 136  --  136  K 3.5  --  4.0  CL 102  --  103  CO2 24  --  27  GLUCOSE 104*  --  104*  BUN 14  --  12  CREATININE 1.00 0.95 0.82  CALCIUM 9.1  --  8.4*   PT/INR No results for input(s): LABPROT, INR in the last 72 hours. ABG No results for input(s): PHART, HCO3 in the last 72 hours.  Invalid input(s): PCO2, PO2  Studies/Results: No results found.  Anti-infectives: Anti-infectives    Start     Dose/Rate Route Frequency Ordered Stop   08/28/15 0000  ciprofloxacin (CIPRO) 500 MG tablet     500 mg Oral 2 times daily 08/28/15 1215     08/28/15 0000  metroNIDAZOLE (FLAGYL) 500 MG tablet     500 mg Oral Every 8 hours 08/28/15 1215     08/28/15 0000  fluconazole (DIFLUCAN) 200 MG tablet     200 mg Oral Daily 08/28/15 1215     08/27/15 1400  metroNIDAZOLE (FLAGYL) tablet 500 mg     500 mg Oral 3 times per day 08/27/15 1151     08/27/15 1200  ciprofloxacin (CIPRO) tablet 500 mg     500 mg Oral 2 times daily 08/27/15 1151     08/26/15 0000  ertapenem (INVANZ) 1 g in sodium chloride  0.9 % 50 mL IVPB  Status:  Discontinued     1 g 100 mL/hr over 30 Minutes Intravenous Every 24 hours 08/25/15 2214 08/27/15 1151   08/25/15 2130  piperacillin-tazobactam (ZOSYN) IVPB 3.375 g     3.375 g 100 mL/hr over 30 Minutes Intravenous  Once 08/25/15 2117 08/25/15 2214      Assessment/Plan:  45 year old female admitted with acute diverticuli's. She has responded to IV antibiotics and continue to improve on oral Avelox. Plan for discharge home today to complete her course of antibiotics. She can follow-up in clinic in 2 weeks and then will need a colonoscopy in 6-8 weeks.  Linda Ortiz T. Tonita CongWoodham, MD, FACS  08/28/2015

## 2015-09-03 ENCOUNTER — Telehealth: Payer: Self-pay | Admitting: General Surgery

## 2015-09-03 ENCOUNTER — Telehealth: Payer: Self-pay

## 2015-09-03 NOTE — Telephone Encounter (Signed)
Patient was given diflucan for yeast infection. She has taken 2 pills and has 2 left, however she states no relief. She would like to know if she would need more or another type of prescription. Please contact patient. Patient will be picking up a copy of her FMLA forms today.

## 2015-09-03 NOTE — Telephone Encounter (Signed)
Called patient and left her a voicemail that her disability forms were filled out and ready to be picked up. They were left at the front desk.

## 2015-09-04 ENCOUNTER — Telehealth: Payer: Self-pay | Admitting: General Surgery

## 2015-09-04 MED ORDER — FLUCONAZOLE 200 MG PO TABS: 200.0000 mg | ORAL_TABLET | Freq: Every day | ORAL | Status: DC

## 2015-09-04 NOTE — Telephone Encounter (Signed)
Patient will need to take complete course of Diflucan and see how she feels, if she is still having symptoms she will need to be seen by a Physician prior to prescribing a refill.  I spoke to patient at this time and discussed FMLA paperwork. I explained that I would need to speak with Surgeon prior to being able to alter dates. Awaiting a return phone call from Dr. Tonita CongWoodham regarding this matter, at this time.

## 2015-09-04 NOTE — Telephone Encounter (Signed)
3/14-3/17  °

## 2015-09-04 NOTE — Telephone Encounter (Signed)
Spoke with Dr. Tonita CongWoodham regarding FMLA forms. She may return to work when not having pain any longer. Patient states that she only has slight pain now and would like to return to work this coming Monday, 09/07/15. Disability printed and patient will pick up on Monday with remainder of FMLA paperwork.  Talked with patient about her candidiasis and she states that she also has thrush. Refill sent on Diflucan and explained to patient that she would need to be seen if this remains after this.

## 2015-09-07 NOTE — Telephone Encounter (Signed)
Entered in error

## 2015-09-07 NOTE — Telephone Encounter (Signed)
FMLA form was filled out. A copy will be on patient's chart.

## 2015-09-09 ENCOUNTER — Ambulatory Visit: Payer: BC Managed Care – PPO | Admitting: Surgery

## 2015-09-14 ENCOUNTER — Encounter: Payer: Self-pay | Admitting: General Surgery

## 2015-09-17 ENCOUNTER — Ambulatory Visit (INDEPENDENT_AMBULATORY_CARE_PROVIDER_SITE_OTHER): Payer: BC Managed Care – PPO | Admitting: General Surgery

## 2015-09-17 ENCOUNTER — Other Ambulatory Visit: Payer: Self-pay

## 2015-09-17 ENCOUNTER — Encounter: Payer: Self-pay | Admitting: General Surgery

## 2015-09-17 VITALS — BP 140/91 | HR 80 | Temp 97.3°F | Ht 63.5 in | Wt 365.0 lb

## 2015-09-17 DIAGNOSIS — K5720 Diverticulitis of large intestine with perforation and abscess without bleeding: Secondary | ICD-10-CM

## 2015-09-17 MED ORDER — NA SULFATE-K SULFATE-MG SULF 17.5-3.13-1.6 GM/177ML PO SOLN: 1.0000 | ORAL | Status: DC

## 2015-09-17 NOTE — Progress Notes (Signed)
Outpatient Surgical Follow Up  09/17/2015  Linda Ortiz is an 45 y.o. female.   Chief Complaint  Patient presents with  . Follow-up    ED Diverticulitis 08/25/2015    HPI: 45 year old female returns to clinic for follow-up from recent admission for diverticulitis. Patient reports that the pain is almost completely resolved and she is been off antibiotics for 3 days. She denies any fevers, chills, nausea, vomiting, diarrhea, constipation. She's been having her normal regular bowel movements. She's been tolerating a regular diet. She has not been requiring any pain medication. She states she feels like she is close to Bactrim baseline that she can tell something exchanged within her abdomen. Occasional single spot of tenderness but states it is not reproducible and it is not consistent. She thinks this is related to scar tissue from one of her many previous surgeries.  Past Medical History  Diagnosis Date  . Anxiety   . PCOS (polycystic ovarian syndrome)   . Endometriosis   . Morbid obesity (HCC)   . Menorrhagia   . Dysmenorrhea     Past Surgical History  Procedure Laterality Date  . Bariatric surgery  2007  . Arthroscopy knee w/ drilling    . Appendectomy  2005    Family History  Problem Relation Age of Onset  . Arthritis Mother     rheumatoid  . Hypertension Mother   . Hyperlipidemia Father   . Mental illness Maternal Grandmother     dementia  . Alcohol abuse Maternal Grandmother   . ALS Paternal Grandmother     Social History:  reports that she has been smoking Cigarettes.  She has a 2 pack-year smoking history. She has never used smokeless tobacco. She reports that she drinks about 5.4 oz of alcohol per week. She reports that she does not use illicit drugs.  Allergies: No Known Allergies  Medications reviewed.    ROS A multipoint review of systems was completed, all pertinent positives and negatives are documented in the history of present illness and remainder  are negative.   BP 140/91 mmHg  Pulse 80  Temp(Src) 97.3 F (36.3 C) (Oral)  Ht 5' 3.5" (1.613 m)  Wt 165.563 kg (365 lb)  BMI 63.63 kg/m2  Physical Exam Gen.: No acute distress Neck: Supple and nontender Chest: Clear to auscultation Heart: Regular rhythm Abdomen: Very large, soft, nontender. External use: Moves all tremors well without noticeable edema   No results found for this or any previous visit (from the past 48 hour(s)). No results found.  Assessment/Plan:  1. Diverticulitis of large intestine with perforation without bleeding  45 year old female with her first ever bout of sigmoid diverticulitis with what appeared to be a contained perforation. She did well on antibiotics. Discussed signs and symptoms of recurrent diverticulitis anterior report to clinic immediately should they occur. Otherwise discussed the next step in the workup would be a colonoscopy as it has been approximately 10 years since her last one. Patient voiced understanding. We will set up for outpatient colonoscopy with plans to return to clinic with us 2 weeks afterwards.     Ricarda Frameharles Suprina [place], MD FACS General Surgeon  09/17/2015,10:07 AM

## 2015-09-21 ENCOUNTER — Ambulatory Visit (INDEPENDENT_AMBULATORY_CARE_PROVIDER_SITE_OTHER): Payer: Self-pay | Admitting: Psychiatry

## 2015-10-05 ENCOUNTER — Telehealth: Payer: Self-pay

## 2015-10-05 NOTE — Telephone Encounter (Signed)
Pt calls and states she has began her period, pt has not had a period in some time as she has been on Lupron. Pt states that her period is very heavy and she is having significant cramping. Pt states that she is not sure if she should resume Lupron treatments. Pt also notes that she was recently hospitalized and diagnosed with Diverticulitis. Advised pt that she needs to come in and be evaluated as it has been almost a year since her last visit. Pt gives verbal understanding. Pt added to schedule for 10/06/2015 at 4:30pm as she is a Runner, broadcasting/film/videoteacher. Advised pt on bleeding precautions.

## 2015-10-06 ENCOUNTER — Ambulatory Visit: Payer: Self-pay | Admitting: Obstetrics and Gynecology

## 2015-10-14 ENCOUNTER — Other Ambulatory Visit: Payer: Self-pay | Admitting: Family Medicine

## 2015-10-14 ENCOUNTER — Inpatient Hospital Stay
Admission: EM | Admit: 2015-10-14 | Discharge: 2015-10-20 | DRG: 418 | Disposition: A | Discharge status: Home / Self Care | Payer: BC Managed Care – PPO | Attending: Surgery | Admitting: Surgery

## 2015-10-14 ENCOUNTER — Encounter: Payer: Self-pay | Admitting: *Deleted

## 2015-10-14 ENCOUNTER — Telehealth: Payer: Self-pay

## 2015-10-14 ENCOUNTER — Ambulatory Visit
Admission: RE | Admit: 2015-10-14 | Discharge: 2015-10-14 | Disposition: A | Discharge status: Home / Self Care | Payer: BC Managed Care – PPO | Source: Ambulatory Visit | Attending: Family Medicine | Admitting: Family Medicine

## 2015-10-14 DIAGNOSIS — K859 Acute pancreatitis without necrosis or infection, unspecified: Secondary | ICD-10-CM | POA: Diagnosis present

## 2015-10-14 DIAGNOSIS — K8010 Calculus of gallbladder with chronic cholecystitis without obstruction: Secondary | ICD-10-CM | POA: Diagnosis present

## 2015-10-14 DIAGNOSIS — R932 Abnormal findings on diagnostic imaging of liver and biliary tract: Secondary | ICD-10-CM | POA: Insufficient documentation

## 2015-10-14 DIAGNOSIS — R1013 Epigastric pain: Secondary | ICD-10-CM | POA: Diagnosis present

## 2015-10-14 DIAGNOSIS — R112 Nausea with vomiting, unspecified: Secondary | ICD-10-CM | POA: Diagnosis present

## 2015-10-14 DIAGNOSIS — K8020 Calculus of gallbladder without cholecystitis without obstruction: Secondary | ICD-10-CM | POA: Insufficient documentation

## 2015-10-14 DIAGNOSIS — E860 Dehydration: Secondary | ICD-10-CM | POA: Diagnosis present

## 2015-10-14 DIAGNOSIS — K851 Biliary acute pancreatitis without necrosis or infection: Secondary | ICD-10-CM | POA: Diagnosis not present

## 2015-10-14 DIAGNOSIS — F17210 Nicotine dependence, cigarettes, uncomplicated: Secondary | ICD-10-CM | POA: Diagnosis present

## 2015-10-14 DIAGNOSIS — Z8249 Family history of ischemic heart disease and other diseases of the circulatory system: Secondary | ICD-10-CM | POA: Diagnosis not present

## 2015-10-14 DIAGNOSIS — N179 Acute kidney failure, unspecified: Secondary | ICD-10-CM | POA: Diagnosis present

## 2015-10-14 DIAGNOSIS — F419 Anxiety disorder, unspecified: Secondary | ICD-10-CM | POA: Diagnosis present

## 2015-10-14 DIAGNOSIS — K760 Fatty (change of) liver, not elsewhere classified: Secondary | ICD-10-CM | POA: Diagnosis present

## 2015-10-14 DIAGNOSIS — F329 Major depressive disorder, single episode, unspecified: Secondary | ICD-10-CM | POA: Diagnosis present

## 2015-10-14 DIAGNOSIS — Z9884 Bariatric surgery status: Secondary | ICD-10-CM

## 2015-10-14 DIAGNOSIS — Z6841 Body Mass Index (BMI) 40.0 and over, adult: Secondary | ICD-10-CM | POA: Diagnosis not present

## 2015-10-14 DIAGNOSIS — Z8261 Family history of arthritis: Secondary | ICD-10-CM

## 2015-10-14 DIAGNOSIS — F32A Depression, unspecified: Secondary | ICD-10-CM | POA: Diagnosis present

## 2015-10-14 DIAGNOSIS — F411 Generalized anxiety disorder: Secondary | ICD-10-CM | POA: Diagnosis present

## 2015-10-14 DIAGNOSIS — K660 Peritoneal adhesions (postprocedural) (postinfection): Secondary | ICD-10-CM | POA: Diagnosis present

## 2015-10-14 DIAGNOSIS — Z79899 Other long term (current) drug therapy: Secondary | ICD-10-CM

## 2015-10-14 DIAGNOSIS — R1011 Right upper quadrant pain: Secondary | ICD-10-CM | POA: Insufficient documentation

## 2015-10-14 LAB — URINALYSIS COMPLETE WITH MICROSCOPIC (ARMC ONLY)
Bacteria, UA: NONE SEEN
Bilirubin Urine: NEGATIVE
Glucose, UA: NEGATIVE mg/dL
HGB URINE DIPSTICK: NEGATIVE
KETONES UR: NEGATIVE mg/dL
Leukocytes, UA: NEGATIVE
NITRITE: NEGATIVE
PH: 5 (ref 5.0–8.0)
PROTEIN: 30 mg/dL — AB
RBC / HPF: NONE SEEN RBC/hpf (ref 0–5)
SPECIFIC GRAVITY, URINE: 1.014 (ref 1.005–1.030)

## 2015-10-14 LAB — CBC
HEMATOCRIT: 45.6 % (ref 35.0–47.0)
HEMOGLOBIN: 15.3 g/dL (ref 12.0–16.0)
MCH: 31.7 pg (ref 26.0–34.0)
MCHC: 33.5 g/dL (ref 32.0–36.0)
MCV: 94.4 fL (ref 80.0–100.0)
Platelets: 272 10*3/uL (ref 150–440)
RBC: 4.83 MIL/uL (ref 3.80–5.20)
RDW: 14.9 % — ABNORMAL HIGH (ref 11.5–14.5)
WBC: 11.8 10*3/uL — AB (ref 3.6–11.0)

## 2015-10-14 LAB — COMPREHENSIVE METABOLIC PANEL
ALBUMIN: 4 g/dL (ref 3.5–5.0)
ALT: 637 U/L — AB (ref 14–54)
AST: 366 U/L — AB (ref 15–41)
Alkaline Phosphatase: 369 U/L — ABNORMAL HIGH (ref 38–126)
Anion gap: 12 (ref 5–15)
BILIRUBIN TOTAL: 2.9 mg/dL — AB (ref 0.3–1.2)
BUN: 8 mg/dL (ref 6–20)
CHLORIDE: 101 mmol/L (ref 101–111)
CO2: 24 mmol/L (ref 22–32)
CREATININE: 1.17 mg/dL — AB (ref 0.44–1.00)
Calcium: 9.2 mg/dL (ref 8.9–10.3)
GFR calc Af Amer: >60 mL/min (ref >60)
GFR, EST NON AFRICAN AMERICAN: 56 mL/min — AB (ref >60)
GLUCOSE: 130 mg/dL — AB (ref 65–99)
Potassium: 4.2 mmol/L (ref 3.5–5.1)
Sodium: 137 mmol/L (ref 135–145)
TOTAL PROTEIN: 7.1 g/dL (ref 6.5–8.1)

## 2015-10-14 LAB — LIPASE, BLOOD: Lipase: 1672 U/L — ABNORMAL HIGH (ref 11–51)

## 2015-10-14 MED ORDER — ACETAMINOPHEN 325 MG PO TABS
650.0000 mg | ORAL_TABLET | Freq: Four times a day (QID) | ORAL | Status: DC | PRN
  Administered 2015-10-17 – 2015-10-18 (×3): 650 mg via ORAL
  Filled 2015-10-14 (×3): qty 2

## 2015-10-14 MED ORDER — ONDANSETRON HCL 4 MG PO TABS: 4.0000 mg | ORAL_TABLET | Freq: Four times a day (QID) | ORAL | Status: DC | PRN

## 2015-10-14 MED ORDER — SODIUM CHLORIDE 0.9 % IV SOLN
INTRAVENOUS | Status: DC
  Administered 2015-10-14: 23:00:00 via INTRAVENOUS

## 2015-10-14 MED ORDER — SODIUM CHLORIDE 0.9 % IV BOLUS (SEPSIS)
1000.0000 mL | Freq: Once | INTRAVENOUS | Status: AC
  Administered 2015-10-14: 1000 mL via INTRAVENOUS

## 2015-10-14 MED ORDER — OXYCODONE HCL 5 MG PO TABS
5.0000 mg | ORAL_TABLET | ORAL | Status: DC | PRN
  Filled 2015-10-14: qty 1

## 2015-10-14 MED ORDER — ENOXAPARIN SODIUM 40 MG/0.4ML ~~LOC~~ SOLN
40.0000 mg | Freq: Two times a day (BID) | SUBCUTANEOUS | Status: DC
  Administered 2015-10-15 – 2015-10-18 (×8): 40 mg via SUBCUTANEOUS
  Filled 2015-10-14 (×8): qty 0.4

## 2015-10-14 MED ORDER — MORPHINE SULFATE (PF) 2 MG/ML IV SOLN
2.0000 mg | INTRAVENOUS | Status: DC | PRN
  Administered 2015-10-14 – 2015-10-15 (×3): 2 mg via INTRAVENOUS
  Filled 2015-10-14 (×3): qty 1

## 2015-10-14 MED ORDER — ACETAMINOPHEN 650 MG RE SUPP: 650.0000 mg | Freq: Four times a day (QID) | RECTAL | Status: DC | PRN

## 2015-10-14 MED ORDER — ONDANSETRON HCL 4 MG/2ML IJ SOLN
4.0000 mg | Freq: Four times a day (QID) | INTRAMUSCULAR | Status: DC | PRN
  Administered 2015-10-14 – 2015-10-15 (×3): 4 mg via INTRAVENOUS
  Filled 2015-10-14 (×3): qty 2

## 2015-10-14 NOTE — Consult Note (Signed)
Patient ID: Linda Ortiz, female   DOB: Aug 07, 1970, 45 y.o.   MRN: [id number]  CC: ABDOMINAL PAIN  HPI Linda Ortiz is a 45 y.o. female well-known to the surgery service was advised to report to the emergency department after reviewing her labs and outpatient. She reports a one-week history of upper abdominal pain that is worsened by eating or drinking. She denies any fevers or chills but had emesis today. Upon further questioning she reports a long history of intermittent abdominal pains but nothing like this before. She is noted to have gallstones and relatively severe pancreatitis and urgent care earlier today. She reports feeling somewhat better since reporting to the emergency department. She reports subjective fevers but no chills. She has had nausea and vomiting as well as her abdominal pain. No other changes in bowel function.  HPI  Past Medical History  Diagnosis Date  . Anxiety   . PCOS (polycystic ovarian syndrome)   . Endometriosis   . Morbid obesity (HCC)   . Menorrhagia   . Dysmenorrhea     Past Surgical History  Procedure Laterality Date  . Bariatric surgery  2007  . Arthroscopy knee w/ drilling    . Appendectomy  2005    Family History  Problem Relation Age of Onset  . Arthritis Mother     rheumatoid  . Hypertension Mother   . Hyperlipidemia Father   . Mental illness Maternal Grandmother     dementia  . Alcohol abuse Maternal Grandmother   . ALS Paternal Grandmother     Social History Social History  Substance Use Topics  . Smoking status: Current Every Day Smoker -- 0.25 packs/day for 8 years    Types: Cigarettes  . Smokeless tobacco: Never Used  . Alcohol Use: 5.4 oz/week    8 Glasses of wine, 0 Cans of beer, 1 Shots of liquor per week    No Known Allergies  No current facility-administered medications for this encounter.   Current Outpatient Prescriptions  Medication Sig Dispense Refill  . Lactobacillus-Inulin (CULTURELLE DIGESTIVE HEALTH)  CAPS Take 1 capsule by mouth 2 (two) times daily.    Marland Kitchen lamoTRIgine (LAMICTAL) 150 MG tablet Take 150 mg by mouth at bedtime.    . promethazine (PHENERGAN) 25 MG tablet Take 25 mg by mouth every 6 (six) hours as needed for nausea or vomiting.    . venlafaxine (EFFEXOR) 75 MG tablet Take 1 tablet (75 mg total) by mouth 2 (two) times daily. 60 tablet 3  . zolpidem (AMBIEN) 10 MG tablet Take 1 tablet (10 mg total) by mouth at bedtime. 30 tablet 3  . Na Sulfate-K Sulfate-Mg Sulf (SUPREP BOWEL PREP) SOLN Take 1 Bottle by mouth as directed. Please take one bottle at 5:00 PM the day before procedure and take second bottle 4 hours prior to your procedure. 1 Bottle 0     Review of Systems A Multi-point review of systems was asked and was negative except for the findings documented in the history of present illness  Physical Exam Blood pressure 150/107, pulse 94, temperature 98.8 F (37.1 C), temperature source Oral, resp. rate 18, height  (1.651 m), weight 159.666 kg (352 lb), SpO2 97 %. CONSTITUTIONAL: Resting in bed in no acute distress. EYES: Pupils are equal, round, and reactive to light, Sclera are non-icteric. EARS, NOSE, MOUTH AND THROAT: The oropharynx is clear. The oral mucosa is pink and moist. Hearing is intact to voice. LYMPH NODES:  Lymph nodes in the neck are normal.  RESPIRATORY:  Lungs are clear. There is normal respiratory effort, with equal breath sounds bilaterally, and without pathologic use of accessory muscles. CARDIOVASCULAR: Heart is regular without murmurs, gallops, or rubs. GI: The abdomen is superobese, soft, tender to palpation in the midepigastrium, and nondistended. There are no palpable masses. There is no hepatosplenomegaly. There are normal bowel sounds in all quadrants. GU: Rectal deferred.   MUSCULOSKELETAL: Normal muscle strength and tone. No cyanosis or edema.   SKIN: Turgor is good and there are no pathologic skin lesions or ulcers. NEUROLOGIC: Motor and  sensation is grossly normal. Cranial nerves are grossly intact. PSYCH:  Oriented to person, place and time. Affect is normal.  Data Reviewed Images and labs reviewed. Mild eccentric stenosis 11.8, severe pancreatitis with a lipase of over 1600, alkaline phosphatase of 369, hyperbilirubinemia of 2.9. Ultrasound with evidence of cholelithiasis but no cholecystitis. I have personally reviewed the patient's imaging, laboratory findings and medical records.    Assessment    Pancreatitis    Plan    Had long conversation with the patient about the severity of her pancreatitis as well as the reasons for her needing to be admitted to the hospital. Discussed that her current problem is not surgical in nature and better served by the medicine service. After long conversation about the possible outcomes of pancreatitis if not treated correctly she begrudgingly excepted my advice. Given this is likely biliary in nature surgery will continue to follow along as a consult. However, patient has had gastric bypass and should she require an ERCP it would be very difficult at best. Patient voiced understanding that this may require her to be transferred to a tertiary care center but desires to stay here until that decision is absolutely necessary. Surgery will continue to follow with you     Time spent with the patient was 30 minutes, with more than 50% of the time spent in face-to-face education, counseling and care coordination.     Ricarda Frameharles Anniyah Mood, MD FACS General Surgeon 10/14/2015, 9:21 PM

## 2015-10-14 NOTE — ED Notes (Signed)
States she had an abd US preformed today and was called and told to come to the ER, states mid/upper abd pain with vomiting

## 2015-10-14 NOTE — Telephone Encounter (Signed)
Received Labs drawn by [facility] today via faxed. These labs are extremely abnormal and indicate that patient is currently having Gallstone Pancreatitis. I reviewed lab results with Dr. Orvis BrillLoflin in clinic. Called [facility], spoke with Shanda BumpsJessica (nurse) and explained that she needs to call patient and she needs to come to the emergency room immediately. She states that she will call patient right now.  Tomorrow's appointment has been cancelled.

## 2015-10-14 NOTE — ED Provider Notes (Signed)
[facility] Emergency Department Provider Note  ____________________________________________  Time seen: 8:25 PM  I have reviewed the triage vital signs and the nursing notes.   HISTORY  Chief Complaint Abdominal Pain    HPI Linda Ortiz is a 45 y.o. female who complains of epigastric pain that is worse with eating or drinking fluids, worsening over the past 6 days. Intermittent, nonradiating. No alleviating factors. Moderate in intensity at its worse. No fevers or chills. Started having vomiting today. Had outpatient ultrasound of the gallbladder and labs which showed markedly elevated LFTs and lipase along with cholelithiasis.     Past Medical History  Diagnosis Date  . Anxiety   . PCOS (polycystic ovarian syndrome)   . Endometriosis   . Morbid obesity (HCC)   . Menorrhagia   . Dysmenorrhea      Patient Active Problem List   Diagnosis Date Noted  . Diverticulitis large intestine 08/25/2015  . Diverticulitis of large intestine with perforation without bleeding   . Morbid obesity with BMI of 70 and over, adult (HCC) 12/05/2014  . Menorrhagia 12/05/2014  . Dysmenorrhea 12/05/2014  . PCOS (polycystic ovarian syndrome) 12/05/2014  . Endometriosis 12/05/2014  . Anxiety and depression 12/05/2014  . 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 Routine general medical examination at a health care facility 05/05/2013  . Elevated blood pressure reading without diagnosis of hypertension 05/05/2013  . Pain of both elbows 05/05/2013  . Cyst of joint of ankle or foot 03/23/2013  . Vitamin D deficiency 07/11/2012  . Status post gastric bypass for obesity 07/11/2012  . Generalized anxiety disorder      Past Surgical History  Procedure Laterality Date  . Bariatric surgery  2007  . Arthroscopy knee w/ drilling    . Appendectomy  2005     Current Outpatient Rx  Name  Route  Sig  Dispense  Refill  .  Lactobacillus-Inulin (CULTURELLE DIGESTIVE HEALTH) CAPS   Oral   Take 1 capsule by mouth 2 (two) times daily.         Marland Kitchen lamoTRIgine (LAMICTAL) 150 MG tablet   Oral   Take 150 mg by mouth at bedtime.         . promethazine (PHENERGAN) 25 MG tablet   Oral   Take 25 mg by mouth every 6 (six) hours as needed for nausea or vomiting.         . venlafaxine (EFFEXOR) 75 MG tablet   Oral   Take 1 tablet (75 mg total) by mouth 2 (two) times daily.   60 tablet   3   . zolpidem (AMBIEN) 10 MG tablet   Oral   Take 1 tablet (10 mg total) by mouth at bedtime.   30 tablet   3   . Na Sulfate-K Sulfate-Mg Sulf (SUPREP BOWEL PREP) SOLN   Oral   Take 1 Bottle by mouth as directed. Please take one bottle at 5:00 PM the day before procedure and take second bottle 4 hours prior to your procedure.   1 Bottle   0      Allergies Review of patient's allergies indicates no known allergies.   Family History  Problem Relation Age of Onset  . Arthritis Mother     rheumatoid  . Hypertension Mother   . Hyperlipidemia Father   . Mental illness Maternal Grandmother     dementia  . Alcohol abuse Maternal Grandmother   . ALS Paternal Grandmother  Social History Social History  Substance Use Topics  . Smoking status: Current Every Day Smoker -- 0.25 packs/day for 8 years    Types: Cigarettes  . Smokeless tobacco: Never Used  . Alcohol Use: 5.4 oz/week    8 Glasses of wine, 0 Cans of beer, 1 Shots of liquor per week    Review of Systems  Constitutional:   No fever or chills.  Eyes:   No vision changes.  ENT:   No sore throat. No rhinorrhea. Cardiovascular:   No chest pain. Respiratory:   No dyspnea or cough. Gastrointestinal:   Positive as above for epigastric abdominal pain with vomiting and no diarrhea.  Genitourinary:   Negative for dysuria or difficulty urinating. Musculoskeletal:   Negative for focal pain or swelling Neurological:   Negative for headaches 10-point ROS  otherwise negative.  ____________________________________________   PHYSICAL EXAM:  VITAL SIGNS: ED Triage Vitals  Enc Vitals Group     BP 10/14/15 1832 150/107 mmHg     Pulse Rate 10/14/15 1832 94     Resp 10/14/15 1832 18     Temp 10/14/15 1832 98.8 F (37.1 C)     Temp Source 10/14/15 1832 Oral     SpO2 10/14/15 1832 97 %     Weight 10/14/15 1832 352 lb (159.666 kg)     Height 10/14/15 1832 5\' 5"  (1.651 m)     Head Cir --      Peak Flow --      Pain Score 10/14/15 1833 4     Pain Loc --      Pain Edu? --      Excl. in GC? --     Vital signs reviewed, nursing assessments reviewed.   Constitutional:   Alert and oriented. Uncomfortable but not in distress. Eyes:   No scleral icterus. No conjunctival pallor. PERRL. EOMI.  No nystagmus. ENT   Head:   Normocephalic and atraumatic.   Nose:   No congestion/rhinnorhea. No septal hematoma   Mouth/Throat:   Dry mucous membranes, no pharyngeal erythema. No peritonsillar mass.    Neck:   No stridor. No SubQ emphysema. No meningismus. No crepitus Hematological/Lymphatic/Immunilogical:   No cervical lymphadenopathy. Cardiovascular:   RRR. Symmetric bilateral radial and DP pulses.  No murmurs.  Respiratory:   Normal respiratory effort without tachypnea nor retractions. Breath sounds are clear and equal bilaterally. No wheezes/rales/rhonchi. Gastrointestinal:   Soft with epigastric tenderness. Non distended. There is no CVA tenderness.  No rebound, rigidity, or guarding. Genitourinary:   deferred Musculoskeletal:   Nontender with normal range of motion in all extremities. No joint effusions.  No lower extremity tenderness.  No edema. Neurologic:   Normal speech and language.  CN 2-10 normal. Motor grossly intact. No gross focal neurologic deficits are appreciated.  Skin:    Skin is warm, dry and intact. No rash noted.  No petechiae, purpura, or bullae.  ____________________________________________    LABS (pertinent  positives/negatives) (all labs ordered are listed, but only abnormal results are displayed) Labs Reviewed  LIPASE, BLOOD - Abnormal; Notable for the following:    Lipase 1672 (*)    All other components within normal limits  COMPREHENSIVE METABOLIC PANEL - Abnormal; Notable for the following:    Glucose, Bld 130 (*)    Creatinine, Ser 1.17 (*)    AST 366 (*)    ALT 637 (*)    Alkaline Phosphatase 369 (*)    Total Bilirubin 2.9 (*)    GFR calc non  Af Amer 56 (*)    All other components within normal limits  CBC - Abnormal; Notable for the following:    WBC 11.8 (*)    RDW 14.9 (*)    All other components within normal limits  URINALYSIS COMPLETEWITH MICROSCOPIC ([facility] ONLY)   ____________________________________________   EKG    ____________________________________________    RADIOLOGY  Ultrasound right upper quadrant reviewed, shows cholelithiasis without stigmata of cholecystitis.  ____________________________________________   PROCEDURES   ____________________________________________   INITIAL IMPRESSION / ASSESSMENT AND PLAN / ED COURSE  Pertinent labs & imaging results that were available during my care of the patient were reviewed by me and considered in my medical decision making (see chart for details).  Patient presents with abdominal pain found to have pancreatitis associated with cholelithiasis. Case discussed at length with the patient and her family in the treatment room, along with Dr. Tonita Cong of Gen. surgery who came down and discussed the diagnosis and expected clinical course with the patient and family as well. Together we have explained to the patient the current findings and expected clinical course. She is in agreement. She declines analgesics or antiemetics at this moment. I have started IV fluids for her. I discussed the case with the hospitalist will evaluate for admission.  ____________________________________________   FINAL CLINICAL  IMPRESSION(S) / ED DIAGNOSES  Final diagnoses:  Epigastric pain  Acute gallstone pancreatitis       Portions of this note were generated with dragon dictation software. Dictation errors may occur despite best attempts at proofreading.   Sharman Cheek, MD 10/14/15 2117

## 2015-10-14 NOTE — H&P (Signed)
[facility] [place] [facility]   PATIENT NAME: Linda Ortiz    MR#:  [id number]  DATE OF BIRTH:  26-Dec-1970  DATE OF ADMISSION:  10/14/2015  PRIMARY CARE PHYSICIAN: Sherlene Shams, MD   REQUESTING/REFERRING PHYSICIAN: Scotty Court, MD  CHIEF COMPLAINT:   Chief Complaint  Patient presents with  . Abdominal Pain    HISTORY OF PRESENT ILLNESS:  Linda Ortiz  is a 45 y.o. female who presents with Right upper quadrant abdominal pain for the past 5 days associated with nausea and vomiting. Patient states that the pain has been intermittent but fairly consistently prolonged periods of time throughout this time frame. She states the symptoms would get worse after eating. On evaluation in the ED today she was found to have significantly elevated lipase as well as transaminitis, and gallstones seen on imaging. Hospitalists were called for admission for pancreatitis  PAST MEDICAL HISTORY:   Past Medical History  Diagnosis Date  . Anxiety   . PCOS (polycystic ovarian syndrome)   . Endometriosis   . Morbid obesity (HCC)   . Menorrhagia   . Dysmenorrhea     PAST SURGICAL HISTORY:   Past Surgical History  Procedure Laterality Date  . Bariatric surgery  2007  . Arthroscopy knee w/ drilling    . Appendectomy  2005    SOCIAL HISTORY:   Social History  Substance Use Topics  . Smoking status: Current Every Day Smoker -- 0.25 packs/day for 8 years    Types: Cigarettes  . Smokeless tobacco: Never Used  . Alcohol Use: 5.4 oz/week    8 Glasses of wine, 0 Cans of beer, 1 Shots of liquor per week    FAMILY HISTORY:   Family History  Problem Relation Age of Onset  . Arthritis Mother     rheumatoid  . Hypertension Mother   . Hyperlipidemia Father   . Mental illness Maternal Grandmother     dementia  . Alcohol abuse Maternal Grandmother   . ALS Paternal Grandmother     DRUG ALLERGIES:  No Known Allergies  MEDICATIONS AT HOME:   Prior to Admission  medications   Medication Sig Start Date End Date Taking? Authorizing Provider  Lactobacillus-Inulin (CULTURELLE DIGESTIVE HEALTH) CAPS Take 1 capsule by mouth 2 (two) times daily.   Yes Historical Provider, MD  lamoTRIgine (LAMICTAL) 150 MG tablet Take 150 mg by mouth at bedtime.   Yes Historical Provider, MD  promethazine (PHENERGAN) 25 MG tablet Take 25 mg by mouth every 6 (six) hours as needed for nausea or vomiting.   Yes Historical Provider, MD  venlafaxine (EFFEXOR) 75 MG tablet Take 1 tablet (75 mg total) by mouth 2 (two) times daily. 07/24/15  Yes Kerin Salen, MD  zolpidem (AMBIEN) 10 MG tablet Take 1 tablet (10 mg total) by mouth at bedtime. 07/24/15  Yes Kerin Salen, MD  Na Sulfate-K Sulfate-Mg Sulf (SUPREP BOWEL PREP) SOLN Take 1 Bottle by mouth as directed. Please take one bottle at 5:00 PM the day before procedure and take second bottle 4 hours prior to your procedure. 09/17/15   Ricarda Frame, MD    REVIEW OF SYSTEMS:  Review of Systems  Constitutional: Negative for fever, chills, weight loss and malaise/fatigue.  HENT: Negative for ear pain, hearing loss and tinnitus.   Eyes: Negative for blurred vision, double vision, pain and redness.  Respiratory: Negative for cough, hemoptysis and shortness of breath.   Cardiovascular: Negative for chest pain, palpitations, orthopnea and leg  swelling.  Gastrointestinal: Positive for nausea, vomiting and abdominal pain. Negative for diarrhea and constipation.  Genitourinary: Negative for dysuria, frequency and hematuria.  Musculoskeletal: Negative for back pain, joint pain and neck pain.  Skin:       No acne, rash, or lesions  Neurological: Negative for dizziness, tremors, focal weakness and weakness.  Endo/Heme/Allergies: Negative for polydipsia. Does not bruise/bleed easily.  Psychiatric/Behavioral: Negative for depression. The patient is not nervous/anxious and does not have insomnia.      VITAL SIGNS:   Filed Vitals:    10/14/15 1832  BP: 150/107  Pulse: 94  Temp: 98.8 F (37.1 C)  TempSrc: Oral  Resp: 18  Height: 5\' 5"  (1.651 m)  Weight: 159.666 kg (352 lb)  SpO2: 97%   Wt Readings from Last 3 Encounters:  10/14/15 159.666 kg (352 lb)  09/17/15 165.563 kg (365 lb)  08/25/15 136.533 kg (301 lb)    PHYSICAL EXAMINATION:  Physical Exam  Vitals reviewed. Constitutional: She is oriented to person, place, and time. She appears well-developed and well-nourished. No distress.  HENT:  Head: Normocephalic and atraumatic.  Mouth/Throat: Oropharynx is clear and moist.  Eyes: Conjunctivae and EOM are normal. Pupils are equal, round, and reactive to light. No scleral icterus.  Neck: Normal range of motion. Neck supple. No JVD present. No thyromegaly present.  Cardiovascular: Regular rhythm and intact distal pulses.  Exam reveals no gallop and no friction rub.   No murmur heard. Borderline tachycardic  Respiratory: Effort normal and breath sounds normal. No respiratory distress. She has no wheezes. She has no rales.  GI: Soft. Bowel sounds are normal. She exhibits no distension. There is tenderness (RUQ).  Musculoskeletal: Normal range of motion. She exhibits no edema.  No arthritis, no gout  Lymphadenopathy:    She has no cervical adenopathy.  Neurological: She is alert and oriented to person, place, and time. No cranial nerve deficit.  No dysarthria, no aphasia  Skin: Skin is warm and dry. No rash noted. No erythema.  Psychiatric: She has a normal mood and affect. Her behavior is normal. Judgment and thought content normal.    LABORATORY PANEL:   CBC  Recent Labs Lab 10/14/15 1834  WBC 11.8*  HGB 15.3  HCT 45.6  PLT 272   ------------------------------------------------------------------------------------------------------------------  Chemistries   Recent Labs Lab 10/14/15 1834  NA 137  K 4.2  CL 101  CO2 24  GLUCOSE 130*  BUN 8  CREATININE 1.17*  CALCIUM 9.2  AST 366*  ALT  637*  ALKPHOS 369*  BILITOT 2.9*   ------------------------------------------------------------------------------------------------------------------  Cardiac Enzymes No results for input(s): TROPONINI in the last 168 hours. ------------------------------------------------------------------------------------------------------------------  RADIOLOGY:  [place] Abdomen Limited Ruq  10/14/2015  CLINICAL DATA:  Upper abdominal pain for 1 week EXAM: US ABDOMEN LIMITED - RIGHT UPPER QUADRANT COMPARISON:  CT abdomen and pelvis August 25, 2015 FINDINGS: Gallbladder: Within the gallbladder, there are multiple echogenic foci which move and shadow consistent with cholelithiasis. Largest individual gallstone measures 7 mm in length. There is no gallbladder wall thickening or pericholecystic fluid collection. No sonographic Murphy sign noted by sonographer. Common bile duct: Diameter: 5 mm. There is no intrahepatic or extrahepatic biliary duct dilatation. Liver: No focal lesion identified. Liver echogenicity is overall somewhat increased. IMPRESSION: Cholelithiasis. Liver echogenicity overall increased. Suspect a degree of hepatic steatosis. While no focal liver lesions are identified, it must be cautioned that the sensitivity of ultrasound for focal liver lesions is diminished in this circumstance. Electronically Signed  By: Bretta Bang III M.D.   On: 10/14/2015 10:47    EKG:  No orders found for this or any previous visit.  IMPRESSION AND PLAN:  Principal Problem:   Pancreatitis, acute - denoted by elevated lipase. Patient has no prior history of pancreatitis. She does have gallstones seen on imaging today, though her common bile duct was not dilated. However, given the history of symptoms for the past 5 days, it is unclear whether or not she may have passed a stone prior to this. We will admit her, nothing by mouth for now, IV fluids, check a.m. lipase and LFTs. Active Problems:   Gallstones - surgery  spoke with her preliminarily in the ED, no surgery until her pancreatitis and transaminitis improves. Can consult surgery over the next couple of days to determine need for surgical intervention as her acute problems resolve.   AKI (acute kidney injury) (HCC) - likely due to dehydration and prerenal state. IV fluids tonight and monitor closely, avoid nephrotoxins.   Generalized anxiety disorder - holding home meds for now, can restart these once patient's symptoms improved.   Depression - holding home meds for now, restart when she's taking by mouth.  All the records are reviewed and case discussed with ED provider. Management plans discussed with the patient and/or family.  DVT PROPHYLAXIS: SubQ lovenox  GI PROPHYLAXIS: None  ADMISSION STATUS: Inpatient  CODE STATUS: Full Code Status History    Date Active Date Inactive Code Status Order ID Comments User Context   08/25/2015 10:14 PM 08/28/2015  5:11 PM Full Code [id number]  Tiney Rouge III, MD ED    Advance Directive Documentation        Most Recent Value   Type of Advance Directive  Healthcare Power of Attorney, Living will   Pre-existing out of facility DNR order (yellow form or pink MOST form)     "MOST" Form in Place?        TOTAL TIME TAKING CARE OF THIS PATIENT: 45 minutes.    Linda Ortiz 10/14/2015, 9:22 PM  [employer] [facility]  Office  [phone]  CC: Primary care physician; Sherlene Shams, MD

## 2015-10-15 ENCOUNTER — Ambulatory Visit: Payer: Self-pay | Admitting: Surgery

## 2015-10-15 DIAGNOSIS — K851 Biliary acute pancreatitis without necrosis or infection: Secondary | ICD-10-CM | POA: Insufficient documentation

## 2015-10-15 LAB — COMPREHENSIVE METABOLIC PANEL
ALK PHOS: 296 U/L — AB (ref 38–126)
ALT: 485 U/L — AB (ref 14–54)
ANION GAP: 8 (ref 5–15)
AST: 198 U/L — ABNORMAL HIGH (ref 15–41)
Albumin: 3.5 g/dL (ref 3.5–5.0)
BILIRUBIN TOTAL: 1.2 mg/dL (ref 0.3–1.2)
BUN: 9 mg/dL (ref 6–20)
CALCIUM: 8.5 mg/dL — AB (ref 8.9–10.3)
CO2: 24 mmol/L (ref 22–32)
CREATININE: 1.05 mg/dL — AB (ref 0.44–1.00)
Chloride: 106 mmol/L (ref 101–111)
Glucose, Bld: 103 mg/dL — ABNORMAL HIGH (ref 65–99)
Potassium: 3.8 mmol/L (ref 3.5–5.1)
SODIUM: 138 mmol/L (ref 135–145)
TOTAL PROTEIN: 6.1 g/dL — AB (ref 6.5–8.1)

## 2015-10-15 LAB — CBC
HCT: 40.7 % (ref 35.0–47.0)
Hemoglobin: 13.8 g/dL (ref 12.0–16.0)
MCH: 32.2 pg (ref 26.0–34.0)
MCHC: 33.8 g/dL (ref 32.0–36.0)
MCV: 95.2 fL (ref 80.0–100.0)
PLATELETS: 222 10*3/uL (ref 150–440)
RBC: 4.28 MIL/uL (ref 3.80–5.20)
RDW: 15.2 % — AB (ref 11.5–14.5)
WBC: 8.1 10*3/uL (ref 3.6–11.0)

## 2015-10-15 LAB — LIPASE, BLOOD
Lipase: 350 U/L — ABNORMAL HIGH (ref 11–51)
Lipase: 179 U/L — ABNORMAL HIGH (ref 11–51)

## 2015-10-15 LAB — VITAMIN B12: VITAMIN B 12: 304 pg/mL (ref 180–914)

## 2015-10-15 MED ORDER — SODIUM CHLORIDE 0.9 % IV SOLN
INTRAVENOUS | Status: AC
  Administered 2015-10-15 (×2): via INTRAVENOUS

## 2015-10-15 MED ORDER — ZOLPIDEM TARTRATE 5 MG PO TABS
10.0000 mg | ORAL_TABLET | Freq: Every evening | ORAL | Status: DC | PRN
  Administered 2015-10-15 – 2015-10-18 (×4): 10 mg via ORAL
  Filled 2015-10-15 (×6): qty 2

## 2015-10-15 MED ORDER — LAMOTRIGINE 100 MG PO TABS
150.0000 mg | ORAL_TABLET | Freq: Every day | ORAL | Status: DC
  Administered 2015-10-15 – 2015-10-18 (×4): 150 mg via ORAL
  Filled 2015-10-15: qty 2
  Filled 2015-10-15: qty 1
  Filled 2015-10-15: qty 1.5
  Filled 2015-10-15 (×4): qty 2

## 2015-10-15 MED ORDER — VENLAFAXINE HCL 37.5 MG PO TABS
75.0000 mg | ORAL_TABLET | Freq: Two times a day (BID) | ORAL | Status: DC
  Administered 2015-10-15 – 2015-10-20 (×9): 75 mg via ORAL
  Filled 2015-10-15 (×2): qty 2
  Filled 2015-10-15: qty 1
  Filled 2015-10-15 (×3): qty 2
  Filled 2015-10-15: qty 1
  Filled 2015-10-15 (×6): qty 2

## 2015-10-15 NOTE — Progress Notes (Addendum)
Initial Nutrition Assessment  DOCUMENTATION CODES:   Morbid obesity  INTERVENTION:   -Await diet progression as medically able -Education: RD provided "Pancreatitis Nutrition Therapy" as well as "Gallbladder Nutrition Therapy" handout from the Academy of [employer] and [employer]etics. Discussed nutrition therapy to reduce the irritation of the pancreas to promote digestion and absorption of nutrients. Provided list of recommended low fat foods as well as a recommended sample day. Teach back method used. Expect good compliance.   NUTRITION DIAGNOSIS:   Inadequate oral intake related to acute illness as evidenced by per patient/family report.  GOAL:   Patient will meet greater than or equal to 90% of their needs  MONITOR:   PO intake, Diet advancement, Labs, I & O's, Weight trends  REASON FOR ASSESSMENT:   Malnutrition Screening Tool    ASSESSMENT:   Pt admitted with n/v and abdominal pain worsening after eating per MD note. Pt with acute pancreatitis. Pt with h/o gastric bypass.  Past Medical History  Diagnosis Date  . Anxiety   . PCOS (polycystic ovarian syndrome)   . Endometriosis   . Morbid obesity (HCC)   . Menorrhagia   . Dysmenorrhea    Past Surgical History  Procedure Laterality Date  . Bariatric surgery  2007  . Arthroscopy knee w/ drilling    . Appendectomy  2005    Diet Order:  Diet clear liquid Room service appropriate?: Yes; Fluid consistency:: Thin   Pt sipping on broth on visit this morning, reports tolerating so far.   Pt reports for the past 5 days PTA not tolerating any food and even liquids having n/v after eating or drinking. Pt reports usually having a good appetite eating whole grains and lots of fruits and vegetables.   Medications: NS at 14325mL/hr Labs: reviewed Lipase 179, 350, 1672   Gastrointestinal Profile: Last BM:  10/14/2015   Nutrition-Focused Physical Exam Findings: Nutrition-Focused physical exam completed. Findings are WDL for  fat depletion, muscle depletion, and edema.    Weight Change: Pt reports weight loss of 26lbs since last at outpatient MD office 5 weeks ago. Per [facility] weight encounters pt weight of 362lbs 2 months ago and was relatively stable the past year back (2.7% weight loss in 2 months).    Skin:  Reviewed, no issues   Height:   Ht Readings from Last 1 Encounters:  10/14/15 5\' 5"  (1.651 m)    Weight:   Wt Readings from Last 1 Encounters:  10/14/15 352 lb (159.666 kg)   Wt Readings from Last 10 Encounters:  10/14/15 352 lb (159.666 kg)  09/17/15 365 lb (165.563 kg)  08/25/15 301 lb (136.533 kg)  07/24/15 362 lb 6.4 oz (164.384 kg)  04/30/15 362 lb 7 oz (164.401 kg)  03/26/15 361 lb 12.8 oz (164.111 kg)  01/29/15 361 lb 9.6 oz (164.021 kg)  12/08/14 363 lb 4.8 oz (164.792 kg)  09/24/14 360 lb 3.2 oz (163.386 kg)  05/03/13 339 lb 8 oz (153.996 kg)    Ideal Body Weight:   56.8kg  BMI:  Body mass index is 58.58 kg/(m^2).  Estimated Nutritional Needs:   Kcal:  1615-1900kcals  Protein:  45-57g protein  Fluid:  >1.8L fluid  EDUCATION NEEDS:   Education needs addressed   Leda QuailAllyson Linkoln Alkire, RD, LDN Pager [phone] Weekend/On-Call Pager [phone]

## 2015-10-15 NOTE — Progress Notes (Signed)
CC: Epigastric pain Subjective: This patient with biliary pancreatitis he feels better than she did when she was admitted in fact her pain is almost gone she's had no nausea vomiting fevers or chills  Objective: Vital signs in last 24 hours: Temp:  [97.5 F (36.4 C)-98.8 F (37.1 C)] 97.9 F (36.6 C) (05/04 1258) Pulse Rate:  [68-94] 68 (05/04 1258) Resp:  [18-20] 20 (05/04 1258) BP: (103-150)/(67-107) 129/84 mmHg (05/04 1258) SpO2:  [94 %-98 %] 96 % (05/04 1258) Weight:  [352 lb (159.666 kg)] 352 lb (159.666 kg) (05/03 1832) Last BM Date: 10/14/15  Intake/Output from previous day:   Intake/Output this shift:    Physical exam:  Morbidly obese female patient in no acute distress abdomen is soft and minimally tender in the epigastrium no mass no Murphy sign tattoos  Lab Results: CBC   Recent Labs  10/14/15 1834 10/15/15 0342  WBC 11.8* 8.1  HGB 15.3 13.8  HCT 45.6 40.7  PLT 272 222   BMET  Recent Labs  10/14/15 1834 10/15/15 0342  NA 137 138  K 4.2 3.8  CL 101 106  CO2 24 24  GLUCOSE 130* 103*  BUN 8 9  CREATININE 1.17* 1.05*  CALCIUM 9.2 8.5*   PT/INR No results for input(s): LABPROT, INR in the last 72 hours. ABG No results for input(s): PHART, HCO3 in the last 72 hours.  Invalid input(s): PCO2, PO2  Studies/Results: Kore[place] Abdomen Limited Ruq  10/14/2015  CLINICAL DATA:  Upper abdominal pain for 1 week EXAM: US ABDOMEN LIMITED - RIGHT UPPER QUADRANT COMPARISON:  CT abdomen and pelvis August 25, 2015 FINDINGS: Gallbladder: Within the gallbladder, there are multiple echogenic foci which move and shadow consistent with cholelithiasis. Largest individual gallstone measures 7 mm in length. There is no gallbladder wall thickening or pericholecystic fluid collection. No sonographic Murphy sign noted by sonographer. Common bile duct: Diameter: 5 mm. There is no intrahepatic or extrahepatic biliary duct dilatation. Liver: No focal lesion identified. Liver echogenicity  is overall somewhat increased. IMPRESSION: Cholelithiasis. Liver echogenicity overall increased. Suspect a degree of hepatic steatosis. While no focal liver lesions are identified, it must be cautioned that the sensitivity of ultrasound for focal liver lesions is diminished in this circumstance. Electronically Signed   By: Bretta BangWilliam  Woodruff III M.D.   On: 10/14/2015 10:47    Anti-infectives: Anti-infectives    None      Assessment/Plan:  Biliary pancreatitis with improving LFTs and lipase. Patient will ultimately require laparoscopic cholecystectomy with cholangiography. The patient has had a Roux-en-Y gastric bypass and ERCP is likely to be unsuccessful. It would be best to wait until her labs returned completely to normal and she is pain-free and then proceed with surgery as findings on cholangiography of her retained stone would almost certainly require an open common duct exploration or transfer to a tertiary center for a complex endoscopic biliary procedure. As is all reviewed with she and her mother the understood and agreed with this plan  Lattie Hawichard E Collene Massimino, MD, FACS  10/15/2015

## 2015-10-15 NOTE — Plan of Care (Signed)
Problem: Nutritional: Goal: Ability to achieve adequate nutritional intake will improve Outcome: Progressing Increased to Clear Liquid Diet. Tolerating well.

## 2015-10-15 NOTE — Progress Notes (Signed)
Patient ID: Ahmira J Manship, female   DOB: 08/09/1970, 45 y.o.   MRN: [id number] [facility] [place] [facility]   PATIENT NAME: Linda Ortiz    MR#:  [id number]  DATE OF BIRTH:  08-21-1970  SUBJECTIVE:   Doing well today. Pain much better.No nausea vomiting REVIEW OF SYSTEMS:   Review of Systems  Constitutional: Negative for fever, chills and weight loss.  HENT: Negative for ear discharge, ear pain and nosebleeds.   Eyes: Negative for blurred vision, pain and discharge.  Respiratory: Negative for sputum production, shortness of breath, wheezing and stridor.   Cardiovascular: Negative for chest pain, palpitations, orthopnea and PND.  Gastrointestinal: Negative for nausea, vomiting, abdominal pain and diarrhea.  Genitourinary: Negative for urgency and frequency.  Musculoskeletal: Negative for back pain and joint pain.  Neurological: Negative for sensory change, speech change, focal weakness and weakness.  Psychiatric/Behavioral: Negative for depression and hallucinations. The patient is not nervous/anxious.    Tolerating Diet:placed on CLD Tolerating PT: not needed  DRUG ALLERGIES:  No Known Allergies  VITALS:  Blood pressure 103/67, pulse 73, temperature 97.5 F (36.4 C), temperature source Oral, resp. rate 18, height  (1.651 m), weight 159.666 kg (352 lb), SpO2 97 %.  PHYSICAL EXAMINATION:   Physical Exam  GENERAL:  45 y.o.-year-old patient lying in the bed with no acute distress.obese,morbidly  EYES: Pupils equal, round, reactive to light and accommodation. No scleral icterus. Extraocular muscles intact.  HEENT: Head atraumatic, normocephalic. Oropharynx and nasopharynx clear.  NECK:  Supple, no jugular venous distention. No thyroid enlargement, no tenderness.  LUNGS: Normal breath sounds bilaterally, no wheezing, rales, rhonchi. No use of accessory muscles of respiration.  CARDIOVASCULAR: S1, S2 normal. No murmurs, rubs, or gallops.   ABDOMEN: Soft, diffuse mild tender, nondistended. Bowel sounds present. No organomegaly or mass.  EXTREMITIES: No cyanosis, clubbing or edema b/l.    NEUROLOGIC: Cranial nerves II through XII are intact. No focal Motor or sensory deficits b/l.   PSYCHIATRIC:  patient is alert and oriented x 3.  SKIN: No obvious rash, lesion, or ulcer.   LABORATORY PANEL:  CBC  Recent Labs Lab 10/15/15 0342  WBC 8.1  HGB 13.8  HCT 40.7  PLT 222    Chemistries   Recent Labs Lab 10/15/15 0342  NA 138  K 3.8  CL 106  CO2 24  GLUCOSE 103*  BUN 9  CREATININE 1.05*  CALCIUM 8.5*  AST 198*  ALT 485*  ALKPHOS 296*  BILITOT 1.2   Cardiac Enzymes No results for input(s): TROPONINI in the last 168 hours. RADIOLOGY:  US Abdomen Limited Ruq  10/14/2015  CLINICAL DATA:  Upper abdominal pain for 1 week EXAM: US ABDOMEN LIMITED - RIGHT UPPER QUADRANT COMPARISON:  CT abdomen and pelvis August 25, 2015 FINDINGS: Gallbladder: Within the gallbladder, there are multiple echogenic foci which move and shadow consistent with cholelithiasis. Largest individual gallstone measures 7 mm in length. There is no gallbladder wall thickening or pericholecystic fluid collection. No sonographic Murphy sign noted by sonographer. Common bile duct: Diameter: 5 mm. There is no intrahepatic or extrahepatic biliary duct dilatation. Liver: No focal lesion identified. Liver echogenicity is overall somewhat increased. IMPRESSION: Cholelithiasis. Liver echogenicity overall increased. Suspect a degree of hepatic steatosis. While no focal liver lesions are identified, it must be cautioned that the sensitivity of ultrasound for focal liver lesions is diminished in this circumstance. Electronically SigFELIZ HERARDetta Bang III M.D.   On: 10/14/2015  10:47   ASSESSMENT AND PLAN:  Penni Homanslizabeth Porath is a 45 y.o. female who presents with Right upper quadrant abdominal pain for the past 5 days associated with nausea and vomiting. Patient  states that the pain has been intermittent but fairly consistently prolonged periods of time throughout this time frame  1.Pancreatitis, acute - denoted by elevated lipase. Patient has no prior history of pancreatitis. She does have gallstones seen on imaging today, though her common bile duct was not dilated. However, given the history of symptoms for the past 5 days, it is unclear whether or not she may have passed a stone prior to this -lipase improving -spoke with dr cooper ok to give CLD and will consider CCK over the weekned -lipase 1600---350  2. Gallstones -  -no surgery until her pancreatitis and transaminitis improves.   3. AKI (acute kidney injury) (HCC) - likely due to dehydration and prerenal state. -cont IV fluids - avoid nephrotoxins.  4. Generalized anxiety disorder  Resumed po meds  5.Depression - holding home meds for now, restart when she's taking by mouth.   Case discussed with Care Management/Social Worker. Management plans discussed with the patient, family and they are in agreement.  CODE STATUS: full DVT Prophylaxis:lovenox TOTAL TIME TAKING CARE OF THIS PATIENT:30 minutes.  >50% time spent on counselling and coordination of care pt,RN, dr cooper  POSSIBLE D/C IN 2-3 DAYS, DEPENDING ON CLINICAL CONDITION.  Note: This dictation was prepared with Dragon dictation along with smaller phrase technology. Any transcriptional errors that result from this process are unintentional.  Linda Ortiz M.D on 10/15/2015 at 12:01 PM  Between 7am to 6pm - Pager - [phone]  After 6pm go to [email or web address] - password [email or web address] [email or web address]  [facility]  Office  [phone]  CC: Primary care physician; Sherlene ShamsULLO, TERESA L, MD

## 2015-10-16 ENCOUNTER — Inpatient Hospital Stay: Payer: BC Managed Care – PPO

## 2015-10-16 DIAGNOSIS — K859 Acute pancreatitis without necrosis or infection, unspecified: Secondary | ICD-10-CM

## 2015-10-16 LAB — CBC WITH DIFFERENTIAL/PLATELET
BASOS ABS: 0 10*3/uL (ref 0–0.1)
EOS ABS: 0.2 10*3/uL (ref 0–0.7)
HCT: 38.5 % (ref 35.0–47.0)
Hemoglobin: 12.9 g/dL (ref 12.0–16.0)
Lymphocytes Relative: 19 %
Lymphs Abs: 1.4 10*3/uL (ref 1.0–3.6)
MCH: 32.3 pg (ref 26.0–34.0)
MCHC: 33.4 g/dL (ref 32.0–36.0)
MCV: 96.8 fL (ref 80.0–100.0)
Monocytes Absolute: 0.6 10*3/uL (ref 0.2–0.9)
Monocytes Relative: 7 %
Neutro Abs: 5.4 10*3/uL (ref 1.4–6.5)
Neutrophils Relative %: 70 %
PLATELETS: 186 10*3/uL (ref 150–440)
RBC: 3.98 MIL/uL (ref 3.80–5.20)
RDW: 14.8 % — ABNORMAL HIGH (ref 11.5–14.5)
WBC: 7.6 10*3/uL (ref 3.6–11.0)

## 2015-10-16 LAB — COMPREHENSIVE METABOLIC PANEL
ALT: 296 U/L — AB (ref 14–54)
AST: 75 U/L — AB (ref 15–41)
Albumin: 3.1 g/dL — ABNORMAL LOW (ref 3.5–5.0)
Alkaline Phosphatase: 262 U/L — ABNORMAL HIGH (ref 38–126)
Anion gap: 7 (ref 5–15)
BILIRUBIN TOTAL: 1.2 mg/dL (ref 0.3–1.2)
BUN: 8 mg/dL (ref 6–20)
CO2: 25 mmol/L (ref 22–32)
CREATININE: 0.83 mg/dL (ref 0.44–1.00)
Calcium: 8.5 mg/dL — ABNORMAL LOW (ref 8.9–10.3)
Chloride: 107 mmol/L (ref 101–111)
GFR calc Af Amer: >60 mL/min (ref >60)
Glucose, Bld: 102 mg/dL — ABNORMAL HIGH (ref 65–99)
POTASSIUM: 3.9 mmol/L (ref 3.5–5.1)
Sodium: 139 mmol/L (ref 135–145)
TOTAL PROTEIN: 5.7 g/dL — AB (ref 6.5–8.1)

## 2015-10-16 LAB — VITAMIN D 25 HYDROXY (VIT D DEFICIENCY, FRACTURES): Vit D, 25-Hydroxy: 15 ng/mL — ABNORMAL LOW (ref 30.0–100.0)

## 2015-10-16 LAB — LIPASE, BLOOD: LIPASE: 42 U/L (ref 11–51)

## 2015-10-16 MED ORDER — DEXTROSE IN LACTATED RINGERS 5 % IV SOLN
INTRAVENOUS | Status: DC
  Administered 2015-10-16 – 2015-10-18 (×6): via INTRAVENOUS

## 2015-10-16 MED ORDER — GADOBENATE DIMEGLUMINE 529 MG/ML IV SOLN
20.0000 mL | Freq: Once | INTRAVENOUS | Status: AC | PRN
  Administered 2015-10-16: 20 mL via INTRAVENOUS

## 2015-10-16 NOTE — Progress Notes (Signed)
CC: GS pancreatitis Subjective: He is feeling well now the pain has subsided. No nausea no vomiting. She has been nothing by mouth for now AVSS  Objective: Vital signs in last 24 hours: Temp:  [97.9 F (36.6 C)-99 F (37.2 C)] 97.9 F (36.6 C) (05/05 1215) Pulse Rate:  [68-70] 69 (05/05 1215) Resp:  [16-19] 19 (05/05 1215) BP: (116-139)/(70-84) 131/73 mmHg (05/05 1215) SpO2:  [98 %-99 %] 99 % (05/05 1215) Weight:  [164.157 kg (361 lb 14.4 oz)] 164.157 kg (361 lb 14.4 oz) (05/04 2100) Last BM Date: 10/14/15  Intake/Output from previous day: 05/04 0701 - 05/05 0700 In: 2312.9 [P.O.:480; I.V.:1832.9] Out: 0  Intake/Output this shift: Total I/O In: 205 [I.V.:205] Out: 500 [Urine:500]  Physical exam: Morbid obesity no acute distress, nontoxic Abd: soft, NT, no peritonitis, no murphy Ext: well perfused Neuro: awake, alert, no motor or sens deficits  Lab Results: CBC   Recent Labs  10/15/15 0342 10/16/15 0504  WBC 8.1 7.6  HGB 13.8 12.9  HCT 40.7 38.5  PLT 222 186   BMET  Recent Labs  10/15/15 0342 10/16/15 0504  NA 138 139  K 3.8 3.9  CL 106 107  CO2 24 25  GLUCOSE 103* 102*  BUN 9 8  CREATININE 1.05* 0.83  CALCIUM 8.5* 8.5*   PT/INR No results for input(s): LABPROT, INR in the last 72 hours. ABG No results for input(s): PHART, HCO3 in the last 72 hours.  Invalid input(s): PCO2, PO2  Studies/Results: No results found.  Anti-infectives: Anti-infectives    None      Assessment/Plan:GS pancreatitis with persistent elevation of the alkaline phosphatase. We will order an MRCP and if there is a stone in the common bile duct [facility]'ll [facility] either [facility] ERCP through the gastric remnant or an open common bile duct exploration.  Scars with the patient in detail if the MRCP shows no evidence of common bile duct stone we will be able to perform a laparoscopic cholecystectomy with intraoperative cholangiogram on Monday. Extensive  counseling provided to the patient and she understands.  Sterling Bigiego Andria Head, MD, [facility]  10/16/2015

## 2015-10-16 NOTE — Progress Notes (Signed)
Patient ID: Linda Ortiz, female   DOB: 04/15/1971, 45 y.o.   MRN: [id number] Spoke with surgery Dr Everlene FarrierPabon. He plans for surgery on Monday MRCP today Pt currently has no active medical issues at present Pt will transfer under surgical service Call internal medicine for any further issues

## 2015-10-16 NOTE — Progress Notes (Signed)
MRCP no CBD stones, d/w pt in detail. We wil schedule her for lap chole on Monday am.

## 2015-10-16 NOTE — Progress Notes (Signed)
Spoke to Dr. Everlene FarrierPabon about continuing IV fluids since they expired.  He gave verbal order to change fluids and continue them.

## 2015-10-16 NOTE — Progress Notes (Signed)
Patient ID: Linda Ortiz, female   DOB: 02/13/1971, 45 y.o.   MRN: [id number] [facility] [place] [facility]   PATIENT NAME: Linda Ortiz    MR#:  [id number]  DATE OF BIRTH:  04/11/1971  SUBJECTIVE:   Doing well today. Pain much better.No nausea vomiting REVIEW OF SYSTEMS:   Review of Systems  Constitutional: Negative for fever, chills and weight loss.  HENT: Negative for ear discharge, ear pain and nosebleeds.   Eyes: Negative for blurred vision, pain and discharge.  Respiratory: Negative for sputum production, shortness of breath, wheezing and stridor.   Cardiovascular: Negative for chest pain, palpitations, orthopnea and PND.  Gastrointestinal: Negative for nausea, vomiting, abdominal pain and diarrhea.  Genitourinary: Negative for urgency and frequency.  Musculoskeletal: Negative for back pain and joint pain.  Neurological: Negative for sensory change, speech change, focal weakness and weakness.  Psychiatric/Behavioral: Negative for depression and hallucinations. The patient is not nervous/anxious.    Tolerating Diet:placed on CLD Tolerating PT: not needed  DRUG ALLERGIES:  No Known Allergies  VITALS:  Blood pressure 125/70, pulse 68, temperature 98.6 F (37 C), temperature source Oral, resp. rate 17, height 5\' 5"  (1.651 m), weight 164.157 kg (361 lb 14.4 oz), SpO2 99 %.  PHYSICAL EXAMINATION:   Physical Exam  GENERAL:  45 y.o.-year-old patient lying in the bed with no acute distress.obese,morbidly  EYES: Pupils equal, round, reactive to light and accommodation. No scleral icterus. Extraocular muscles intact.  HEENT: Head atraumatic, normocephalic. Oropharynx and nasopharynx clear.  NECK:  Supple, no jugular venous distention. No thyroid enlargement, no tenderness.  LUNGS: Normal breath sounds bilaterally, no wheezing, rales, rhonchi. No use of accessory muscles of respiration.  CARDIOVASCULAR: S1, S2 normal. No murmurs, rubs, or gallops.   ABDOMEN: Soft, diffuse mild tender, nondistended. Bowel sounds present. No organomegaly or mass.  EXTREMITIES: No cyanosis, clubbing or edema b/l.    NEUROLOGIC: Cranial nerves II through XII are intact. No focal Motor or sensory deficits b/l.   PSYCHIATRIC:  patient is alert and oriented x 3.  SKIN: No obvious rash, lesion, or ulcer.   LABORATORY PANEL:  CBC  Recent Labs Lab 10/16/15 0504  WBC 7.6  HGB 12.9  HCT 38.5  PLT 186    Chemistries   Recent Labs Lab 10/16/15 0504  NA 139  K 3.9  CL 107  CO2 25  GLUCOSE 102*  BUN 8  CREATININE 0.83  CALCIUM 8.5*  AST 75*  ALT 296*  ALKPHOS 262*  BILITOT 1.2   Cardiac Enzymes No results for input(s): TROPONINI in the last 168 hours. RADIOLOGY:  [place] Abdomen Limited Ruq  10/14/2015  CLINICAL DATA:  Upper abdominal pain for 1 week EXAM: US ABDOMEN LIMITED - RIGHT UPPER QUADRANT COMPARISON:  CT abdomen and pelvis August 25, 2015 FINDINGS: Gallbladder: Within the gallbladder, there are multiple echogenic foci which move and shadow consistent with cholelithiasis. Largest individual gallstone measures 7 mm in length. There is no gallbladder wall thickening or pericholecystic fluid collection. No sonographic Murphy sign noted by sonographer. Common bile duct: Diameter: 5 mm. There is no intrahepatic or extrahepatic biliary duct dilatation. Liver: No focal lesion identified. Liver echogenicity is overall somewhat increased. IMPRESSION: Cholelithiasis. Liver echogenicity overall increased. Suspect a degree of hepatic steatosis. While no focal liver lesions are identified, it must be cautioned that the sensitivity of ultrasound for focal liver lesions is diminished in this circumstance. Electronically Signed   By: Bretta BangWilliam  Woodruff III M.D.  On: 10/14/2015 10:47   ASSESSMENT AND PLAN:  Linda Ortiz is a 45 y.o. female who presents with Right upper quadrant abdominal pain for the past 5 days associated with nausea and vomiting. Patient states  that the pain has been intermittent but fairly consistently prolonged periods of time throughout this time frame  1.Pancreatitis, acute - denoted by elevated lipase. Patient has no prior history of pancreatitis. She does have gallstones seen on USG, though her common bile duct was not dilated.  -lipase improving -spoke with dr cooper ok to give CLD and needs CCK  -lipase 1600---350--176--42  2. Gallstones -  -surgery recommendations per surgery  3. AKI (acute kidney injury) (HCC) - likely due to dehydration and prerenal state. -cont IV fluids - avoid nephrotoxins.  4. Generalized anxiety disorder  Resumed po meds  5.Depression - holding home meds for now, restart when she's taking by mouth.   Case discussed with Care Management/Social Worker. Management plans discussed with the patient, family and they are in agreement.  CODE STATUS: full DVT Prophylaxis:lovenox TOTAL TIME TAKING CARE OF THIS PATIENT:30 minutes.  >50% time spent on counselling and coordination of care pt,RN, dr cooper  POSSIBLE D/C IN 2-3 DAYS, DEPENDING ON CLINICAL CONDITION.  Note: This dictation was prepared with Dragon dictation along with smaller phrase technology. Any transcriptional errors that result from this process are unintentional.  Farrell Pantaleo M.D on 10/16/2015 at 8:59 AM  Between 7am to 6pm - Pager - [phone]  After 6pm go to [email or web address] - password [email or web address] [facility]  [place] [facility]  Office  [phone]  CC: Primary care physician; Sherlene Shams, MD

## 2015-10-17 LAB — COMPREHENSIVE METABOLIC PANEL
ALBUMIN: 3.2 g/dL — AB (ref 3.5–5.0)
ALT: 210 U/L — AB (ref 14–54)
AST: 51 U/L — ABNORMAL HIGH (ref 15–41)
Alkaline Phosphatase: 222 U/L — ABNORMAL HIGH (ref 38–126)
Anion gap: 7 (ref 5–15)
BUN: 7 mg/dL (ref 6–20)
CHLORIDE: 106 mmol/L (ref 101–111)
CO2: 27 mmol/L (ref 22–32)
CREATININE: 0.84 mg/dL (ref 0.44–1.00)
Calcium: 8.6 mg/dL — ABNORMAL LOW (ref 8.9–10.3)
GFR calc non Af Amer: >60 mL/min (ref >60)
Glucose, Bld: 102 mg/dL — ABNORMAL HIGH (ref 65–99)
Potassium: 4 mmol/L (ref 3.5–5.1)
SODIUM: 140 mmol/L (ref 135–145)
Total Bilirubin: 1 mg/dL (ref 0.3–1.2)
Total Protein: 5.9 g/dL — ABNORMAL LOW (ref 6.5–8.1)

## 2015-10-17 NOTE — Progress Notes (Signed)
CC: GS pancreatitis Subjective: she  is feeling well now the pain has subsided. No nausea no vomiting. Tolerating diet AVSS  Objective: Vital signs in last 24 hours: Temp:  [97.8 F (36.6 C)-98.6 F (37 C)] 97.8 F (36.6 C) (05/06 0509) Pulse Rate:  [69-73] 69 (05/06 0509) Resp:  [16-19] 16 (05/06 0509) BP: (131-148)/(73-86) 138/86 mmHg (05/06 0509) SpO2:  [98 %-99 %] 98 % (05/06 0509) Last BM Date: 10/14/15  Intake/Output from previous day: 05/05 0701 - 05/06 0700 In: 3113.3 [P.O.:1360; I.V.:1753.3] Out: 1400 [Urine:1400] Intake/Output this shift:    Physical exam: Morbid obesity no acute distress, nontoxic Abd: soft, NT, no peritonitis, no murphy Ext: well perfused Neuro: awake, alert, no motor or sens deficits  Lab Results: CBC   Recent Labs  10/15/15 0342 10/16/15 0504  WBC 8.1 7.6  HGB 13.8 12.9  HCT 40.7 38.5  PLT 222 186   BMET  Recent Labs  10/16/15 0504 10/17/15 0453  NA 139 140  K 3.9 4.0  CL 107 106  CO2 25 27  GLUCOSE 102* 102*  BUN 8 7  CREATININE 0.83 0.84  CALCIUM 8.5* 8.6*   PT/INR No results for input(s): LABPROT, INR in the last 72 hours. ABG No results for input(s): PHART, HCO3 in the last 72 hours.  Invalid input(s): PCO2, PO2  Studies/Results: Mr Abd W/wo Cm/mrcp  10/16/2015  CLINICAL DATA:  Right upper quadrant abdominal pain for 5 days. Suspected pancreatitis based on elevated lipase level. EXAM: MRI ABDOMEN WITHOUT AND WITH CONTRAST (INCLUDING MRCP) TECHNIQUE: Multiplanar multisequence MR imaging of the abdomen was performed both before and after the administration of intravenous contrast. Heavily T2-weighted images of the biliary and pancreatic ducts were obtained, and three-dimensional MRCP images were rendered by post processing. CONTRAST:  20mL MULTIHANCE GADOBENATE DIMEGLUMINE 529 MG/ML IV SOLN COMPARISON:  Ultrasound 10/14/2015 FINDINGS: Lower chest: The lung bases are clear. No fluoro or pericardial effusion. No worrisome  lung lesions. Hepatobiliary: No focal hepatic lesions or intrahepatic biliary dilatation. The gallbladder demonstrates numerous layering gallstones. Normal caliber and course of the common bile duct. No common bile duct stones. Pancreas: No MR findings to suggest acute pancreatitis. Normal appearance of the pancreatic parenchyma. No mass, inflammation or ductal dilatation. No peripancreatic inflammation or fluid. Spleen: Normal size.  No focal lesions. Adrenals/Urinary Tract: The adrenal glands and kidneys are normal. Stomach/Bowel: The stomach, duodenum, visualized small bowel and visualized colon are unremarkable. Vascular/Lymphatic: The aortic branch vessels are normal. The major venous structures are normal. New line no mesenteric or retroperitoneal mass or adenopathy. Other: No ascites or abdominal wall hernia. No subcutaneous lesions. Musculoskeletal: No significant bony findings. IMPRESSION: Cholelithiasis but no MR findings to suggest acute cholecystitis. Normal caliber and course of the common bile duct. No MR findings to suggest acute pancreatitis normal caliber and course of the main pancreatic duct. Electronically Signed   By: Rudie MeyerP.  Gallerani M.D.   On: 10/16/2015 13:05    Anti-infectives: Anti-infectives    None      Assessment/Plan:GS pancreatitis with persistent elevation of the alkaline phosphatase, slowly resolving. MRCP shows no retained stone  -plan for lap chole Monday -continue diet -ambulate -pain control  10/17/2015

## 2015-10-18 DIAGNOSIS — K851 Biliary acute pancreatitis without necrosis or infection: Principal | ICD-10-CM

## 2015-10-18 MED ORDER — DEXTROSE 5 % IV SOLN
3.0000 g | INTRAVENOUS | Status: DC
  Filled 2015-10-18: qty 3000

## 2015-10-18 NOTE — Progress Notes (Signed)
CC: Gallstone pancreatitis Subjective: Patient is feeling well and denies any abdominal pain. She's not had any nausea or vomiting and is tolerating a diet and having normal bowel function.  Objective: Vital signs in last 24 hours: Temp:  [97.5 F (36.4 C)-98.6 F (37 C)] 97.5 F (36.4 C) (05/07 0427) Pulse Rate:  [65-93] 65 (05/07 0427) Resp:  [16-20] 20 (05/07 0427) BP: (130-157)/(70-98) 138/94 mmHg (05/07 0427) SpO2:  [97 %-100 %] 97 % (05/07 0427) Last BM Date: 10/17/15  Intake/Output from previous day: 05/06 0701 - 05/07 0700 In: 1274 [P.O.:358; I.V.:916] Out: 0  Intake/Output this shift: Total I/O In: 851 [P.O.:240; I.V.:611] Out: -   Physical exam:  Gen.: No acute distress Chest: Clear to auscultation Heart: Regular rhythm Abdomen: Very large, soft, nontender  Lab Results: CBC   Recent Labs  10/16/15 0504  WBC 7.6  HGB 12.9  HCT 38.5  PLT 186   BMET  Recent Labs  10/16/15 0504 10/17/15 0453  NA 139 140  K 3.9 4.0  CL 107 106  CO2 25 27  GLUCOSE 102* 102*  BUN 8 7  CREATININE 0.83 0.84  CALCIUM 8.5* 8.6*   PT/INR No results for input(s): LABPROT, INR in the last 72 hours. ABG No results for input(s): PHART, HCO3 in the last 72 hours.  Invalid input(s): PCO2, PO2  Studies/Results: No results found.  Anti-infectives: Anti-infectives    Start     Dose/Rate Route Frequency Ordered Stop   10/18/15 0825  ceFAZolin (ANCEF) 3 g in dextrose 5 % 50 mL IVPB     3 g 130 mL/hr over 30 Minutes Intravenous On call to O.R. 10/18/15 0825 10/19/15 0559      Assessment/Plan:  45 year old female admitted with gallstone pancreatitis. Her pancreatitis appears to have completely resolved. She is currently scheduled for laparoscopic cholecystectomy tomorrow. She'll be nothing by mouth after midnight. All questions answered to her satisfaction.  Iyahna Obriant T. Tonita CongWoodham, MD, FACS  10/18/2015

## 2015-10-19 ENCOUNTER — Inpatient Hospital Stay: Payer: BC Managed Care – PPO | Admitting: Anesthesiology

## 2015-10-19 ENCOUNTER — Encounter
Admission: EM | Disposition: A | Discharge status: Home / Self Care | Payer: Self-pay | Source: Home / Self Care | Attending: Surgery

## 2015-10-19 ENCOUNTER — Encounter: Payer: Self-pay | Admitting: Anesthesiology

## 2015-10-19 HISTORY — PX: CHOLECYSTECTOMY: SHX55

## 2015-10-19 LAB — POCT PREGNANCY, URINE: Preg Test, Ur: NEGATIVE

## 2015-10-19 LAB — SURGICAL PCR SCREEN
MRSA, PCR: NEGATIVE
STAPHYLOCOCCUS AUREUS: NEGATIVE

## 2015-10-19 SURGERY — LAPAROSCOPIC CHOLECYSTECTOMY WITH INTRAOPERATIVE CHOLANGIOGRAM
Anesthesia: General | Wound class: Clean Contaminated

## 2015-10-19 MED ORDER — FENTANYL CITRATE (PF) 100 MCG/2ML IJ SOLN: 25.0000 ug | INTRAMUSCULAR | Status: DC | PRN

## 2015-10-19 MED ORDER — SUCCINYLCHOLINE CHLORIDE 20 MG/ML IJ SOLN
INTRAMUSCULAR | Status: DC | PRN
  Administered 2015-10-19: 160 mg via INTRAVENOUS

## 2015-10-19 MED ORDER — ONDANSETRON HCL 4 MG/2ML IJ SOLN
4.0000 mg | Freq: Once | INTRAMUSCULAR | Status: AC | PRN
Start: 2015-10-19 — End: 2015-10-19
  Administered 2015-10-19: 4 mg via INTRAVENOUS

## 2015-10-19 MED ORDER — SODIUM CHLORIDE 0.9 % IJ SOLN
INTRAMUSCULAR | Status: AC
  Filled 2015-10-19: qty 10

## 2015-10-19 MED ORDER — ACETAMINOPHEN 10 MG/ML IV SOLN
INTRAVENOUS | Status: AC
  Filled 2015-10-19: qty 100

## 2015-10-19 MED ORDER — ACETAMINOPHEN 10 MG/ML IV SOLN
INTRAVENOUS | Status: DC | PRN
  Administered 2015-10-19: 1000 mg via INTRAVENOUS

## 2015-10-19 MED ORDER — ALBUTEROL SULFATE HFA 108 (90 BASE) MCG/ACT IN AERS
INHALATION_SPRAY | RESPIRATORY_TRACT | Status: DC | PRN
  Administered 2015-10-19: 6 via RESPIRATORY_TRACT
  Administered 2015-10-19: 4 via RESPIRATORY_TRACT

## 2015-10-19 MED ORDER — PROMETHAZINE HCL 25 MG/ML IJ SOLN
6.2500 mg | Freq: Once | INTRAMUSCULAR | Status: AC
  Administered 2015-10-19: 6.25 mg via INTRAVENOUS
  Filled 2015-10-19 (×2): qty 1

## 2015-10-19 MED ORDER — SUGAMMADEX SODIUM 200 MG/2ML IV SOLN
INTRAVENOUS | Status: DC | PRN
  Administered 2015-10-19: 327.4 mg via INTRAVENOUS

## 2015-10-19 MED ORDER — ONDANSETRON HCL 4 MG/2ML IJ SOLN
4.0000 mg | INTRAMUSCULAR | Status: DC | PRN
  Administered 2015-10-19: 4 mg via INTRAVENOUS
  Filled 2015-10-19: qty 2

## 2015-10-19 MED ORDER — KETOROLAC TROMETHAMINE 15 MG/ML IJ SOLN
15.0000 mg | Freq: Four times a day (QID) | INTRAMUSCULAR | Status: DC
  Administered 2015-10-19 – 2015-10-20 (×3): 15 mg via INTRAVENOUS
  Filled 2015-10-19 (×3): qty 1

## 2015-10-19 MED ORDER — BUPIVACAINE-EPINEPHRINE 0.25% -1:200000 IJ SOLN
INTRAMUSCULAR | Status: DC | PRN
  Administered 2015-10-19: 30 mL

## 2015-10-19 MED ORDER — PROPOFOL 10 MG/ML IV BOLUS
INTRAVENOUS | Status: DC | PRN
  Administered 2015-10-19: 150 mg via INTRAVENOUS
  Administered 2015-10-19: 40 mg via INTRAVENOUS
  Administered 2015-10-19: 50 mg via INTRAVENOUS

## 2015-10-19 MED ORDER — LACTATED RINGERS IV SOLN
INTRAVENOUS | Status: DC | PRN
  Administered 2015-10-19 (×3): via INTRAVENOUS

## 2015-10-19 MED ORDER — SODIUM CHLORIDE 0.9 % IJ SOLN
INTRAMUSCULAR | Status: AC
  Filled 2015-10-19: qty 50

## 2015-10-19 MED ORDER — DEXAMETHASONE SODIUM PHOSPHATE 10 MG/ML IJ SOLN
10.0000 mg | Freq: Once | INTRAMUSCULAR | Status: AC
  Administered 2015-10-19: 10 mg via INTRAVENOUS

## 2015-10-19 MED ORDER — DEXAMETHASONE SODIUM PHOSPHATE 10 MG/ML IJ SOLN
INTRAMUSCULAR | Status: AC
  Filled 2015-10-19: qty 1

## 2015-10-19 MED ORDER — SCOPOLAMINE 1 MG/3DAYS TD PT72
1.0000 | MEDICATED_PATCH | TRANSDERMAL | Status: DC
  Administered 2015-10-19: 1.5 mg via TRANSDERMAL
  Filled 2015-10-19 (×2): qty 1

## 2015-10-19 MED ORDER — DEXAMETHASONE SODIUM PHOSPHATE 10 MG/ML IJ SOLN
INTRAMUSCULAR | Status: DC | PRN
  Administered 2015-10-19: 10 mg via INTRAVENOUS

## 2015-10-19 MED ORDER — ONDANSETRON HCL 4 MG/2ML IJ SOLN
INTRAMUSCULAR | Status: AC
  Filled 2015-10-19: qty 2

## 2015-10-19 MED ORDER — PROMETHAZINE HCL 25 MG RE SUPP
25.0000 mg | Freq: Three times a day (TID) | RECTAL | Status: DC | PRN
  Administered 2015-10-19: 25 mg via RECTAL
  Filled 2015-10-19: qty 1

## 2015-10-19 MED ORDER — DEXMEDETOMIDINE HCL IN NACL 200 MCG/50ML IV SOLN
INTRAVENOUS | Status: DC | PRN
  Administered 2015-10-19: 12 ug via INTRAVENOUS
  Administered 2015-10-19 (×2): 20 ug via INTRAVENOUS

## 2015-10-19 MED ORDER — LIDOCAINE HCL (CARDIAC) 20 MG/ML IV SOLN
INTRAVENOUS | Status: DC | PRN
  Administered 2015-10-19: 80 mg via INTRAVENOUS

## 2015-10-19 MED ORDER — ONDANSETRON HCL 4 MG/2ML IJ SOLN
INTRAMUSCULAR | Status: DC | PRN
  Administered 2015-10-19: 8 mg via INTRAVENOUS

## 2015-10-19 MED ORDER — ONDANSETRON HCL 4 MG PO TABS: 4.0000 mg | ORAL_TABLET | ORAL | Status: DC | PRN

## 2015-10-19 MED ORDER — ROCURONIUM BROMIDE 100 MG/10ML IV SOLN
INTRAVENOUS | Status: DC | PRN
Start: 2015-10-19 — End: 2015-10-19
  Administered 2015-10-19: 40 mg via INTRAVENOUS
  Administered 2015-10-19: 20 mg via INTRAVENOUS
  Administered 2015-10-19: 10 mg via INTRAVENOUS

## 2015-10-19 MED ORDER — ACETAMINOPHEN 325 MG PO TABS
650.0000 mg | ORAL_TABLET | Freq: Four times a day (QID) | ORAL | Status: DC
  Administered 2015-10-20: 650 mg via ORAL
  Filled 2015-10-19 (×3): qty 2

## 2015-10-19 MED ORDER — FENTANYL CITRATE (PF) 100 MCG/2ML IJ SOLN
INTRAMUSCULAR | Status: AC
  Filled 2015-10-19: qty 2

## 2015-10-19 MED ORDER — DEXTROSE 5 % IV SOLN
3.0000 g | Freq: Once | INTRAVENOUS | Status: AC
  Administered 2015-10-19: 3 g via INTRAVENOUS
  Filled 2015-10-19: qty 3000

## 2015-10-19 MED ORDER — BUPIVACAINE-EPINEPHRINE (PF) 0.25% -1:200000 IJ SOLN
INTRAMUSCULAR | Status: AC
  Filled 2015-10-19: qty 30

## 2015-10-19 MED ORDER — FENTANYL CITRATE (PF) 100 MCG/2ML IJ SOLN
INTRAMUSCULAR | Status: DC | PRN
  Administered 2015-10-19: 100 ug via INTRAVENOUS
  Administered 2015-10-19 (×5): 50 ug via INTRAVENOUS

## 2015-10-19 MED ORDER — NON FORMULARY: 6.2500 mg | Freq: Once | Status: DC

## 2015-10-19 MED ORDER — PROMETHAZINE HCL 25 MG/ML IJ SOLN
6.2500 mg | Freq: Once | INTRAMUSCULAR | Status: AC
  Administered 2015-10-19: 6.25 mg via INTRAVENOUS
  Filled 2015-10-19: qty 1

## 2015-10-19 SURGICAL SUPPLY — 48 items
APPLICATOR COTTON TIP 6IN STRL (MISCELLANEOUS) ×2 IMPLANT
APPLIER CLIP 5 13 M/L LIGAMAX5 (MISCELLANEOUS) ×2
BULB RESERV EVAC DRAIN JP 100C (MISCELLANEOUS) ×2 IMPLANT
CANISTER SUCT 1200ML W/VALVE (MISCELLANEOUS) ×2 IMPLANT
CHLORAPREP W/TINT 26ML (MISCELLANEOUS) ×2 IMPLANT
CHOLANGIOGRAM CATH TAUT (CATHETERS) IMPLANT
CLEANER CAUTERY TIP 5X5 PAD (MISCELLANEOUS) ×1 IMPLANT
CLIP APPLIE 5 13 M/L LIGAMAX5 (MISCELLANEOUS) ×1 IMPLANT
DECANTER SPIKE VIAL GLASS SM (MISCELLANEOUS) IMPLANT
DEVICE TROCAR PUNCTURE CLOSURE (ENDOMECHANICALS) IMPLANT
DRAIN CHANNEL JP 19F (MISCELLANEOUS) ×2 IMPLANT
DRAPE C-ARM XRAY 36X54 (DRAPES) IMPLANT
ELECT REM PT RETURN 9FT ADLT (ELECTROSURGICAL) ×2
ELECTRODE REM PT RTRN 9FT ADLT (ELECTROSURGICAL) ×1 IMPLANT
ENDOPOUCH RETRIEVER 10 (MISCELLANEOUS) ×2 IMPLANT
GLOVE BIO SURGEON STRL SZ7 (GLOVE) ×14 IMPLANT
GOWN STRL REUS W/ TWL LRG LVL3 (GOWN DISPOSABLE) ×3 IMPLANT
GOWN STRL REUS W/TWL LRG LVL3 (GOWN DISPOSABLE) ×3
HEMOSTAT SURGICEL 2X14 (HEMOSTASIS) ×4 IMPLANT
IRRIGATION STRYKERFLOW (MISCELLANEOUS) ×1 IMPLANT
IRRIGATOR STRYKERFLOW (MISCELLANEOUS) ×2
IV CATH ANGIO 12GX3 LT BLUE (NEEDLE) IMPLANT
IV SOD CHL 0.9% 1000ML (IV SOLUTION) ×2 IMPLANT
L-HOOK LAP DISP 36CM (ELECTROSURGICAL) ×2
LHOOK LAP DISP 36CM (ELECTROSURGICAL) ×1 IMPLANT
LIQUID BAND (GAUZE/BANDAGES/DRESSINGS) ×2 IMPLANT
NEEDLE HYPO 22GX1.5 SAFETY (NEEDLE) ×2 IMPLANT
PACK LAP CHOLECYSTECTOMY (MISCELLANEOUS) ×2 IMPLANT
PAD CLEANER CAUTERY TIP 5X5 (MISCELLANEOUS) ×1
PENCIL ELECTRO HAND CTR (MISCELLANEOUS) ×2 IMPLANT
SCISSORS METZENBAUM CVD 33 (INSTRUMENTS) ×4 IMPLANT
SLEEVE ENDOPATH XCEL 5M (ENDOMECHANICALS) ×8 IMPLANT
SPONGE LAP 18X18 5 PK (GAUZE/BANDAGES/DRESSINGS) ×2 IMPLANT
STOPCOCK 3 WAY  REPLAC (MISCELLANEOUS) IMPLANT
SUT ETHIBOND 0 MO6 C/R (SUTURE) IMPLANT
SUT MNCRL AB 4-0 PS2 18 (SUTURE) ×2 IMPLANT
SUT PDS AB 1 CT  36 (SUTURE) ×2
SUT PDS AB 1 CT 36 (SUTURE) ×2 IMPLANT
SUT SILK 0 SH 30 (SUTURE) ×2 IMPLANT
SUT VIC AB 0 CT2 27 (SUTURE) IMPLANT
SUT VICRYL 0 AB UR-6 (SUTURE) ×4 IMPLANT
SYR 20CC LL (SYRINGE) IMPLANT
TROCAR 130MM GELPORT  DAV (MISCELLANEOUS) ×2 IMPLANT
TROCAR 5M 150ML BLDLS (TROCAR) ×2 IMPLANT
TROCAR XCEL BLUNT TIP 100MML (ENDOMECHANICALS) ×2 IMPLANT
TROCAR XCEL NON-BLD 5MMX100MML (ENDOMECHANICALS) ×6 IMPLANT
TUBING INSUFFLATOR HI FLOW (MISCELLANEOUS) ×2 IMPLANT
WATER STERILE IRR 1000ML POUR (IV SOLUTION) IMPLANT

## 2015-10-19 NOTE — Anesthesia Procedure Notes (Signed)
Procedure Name: Intubation Date/Time: 10/19/2015 11:08 AM Performed by: Evelena PeatFERRERO-CONOVER, Risa Auman Pre-anesthesia Checklist: Patient identified, Emergency Drugs available, Suction available and Patient being monitored Patient Re-evaluated:Patient Re-evaluated prior to inductionOxygen Delivery Method: Circle system utilized Preoxygenation: Pre-oxygenation with 100% oxygen Intubation Type: IV induction and Rapid sequence Laryngoscope Size: Miller and 2 Grade View: Grade I Tube type: Oral Tube size: 7.0 mm Airway Equipment and Method: Stylet and Patient positioned with wedge pillow Placement Confirmation: ETT inserted through vocal cords under direct vision,  positive ETCO2 and breath sounds checked- equal and bilateral Secured at: 22 cm Tube secured with: Tape

## 2015-10-19 NOTE — Transfer of Care (Signed)
Immediate Anesthesia Transfer of Care Note  Patient: Linda Ortiz  Procedure(s) Performed: Procedure(s): LAPAROSCOPIC CHOLECYSTECTOMY  (N/A)  Patient Location: PACU  Anesthesia Type:General  Level of Consciousness: awake, alert  and oriented  Airway & Oxygen Therapy: Patient Spontanous Breathing and Patient connected to face mask oxygen  Post-op Assessment: Report given to RN and Post -op Vital signs reviewed and stable  Post vital signs: Reviewed and stable  Last Vitals:  Filed Vitals:   10/19/15 0440 10/19/15 1036  BP: 137/86 150/91  Pulse: 56 62  Temp:  36.4 C  Resp:  18    Last Pain:  Filed Vitals:   10/19/15 1329  PainSc: 2       Patients Stated Pain Goal: 0 (10/19/15 1036)  Complications: No apparent anesthesia complications

## 2015-10-19 NOTE — Progress Notes (Signed)
Preoperative Review   Patient is met in the preoperative holding area. The history is reviewed in the chart and with the patient. I personally reviewed the options and rationale as well as the risks of this procedure that have been previously discussed with the patient. All questions asked by the patient and/or family were answered to their satisfaction.  Patient agrees to proceed with this procedure at this time.  Bow Buntyn M.D. FACS   

## 2015-10-19 NOTE — Progress Notes (Signed)
Pt c/o nausea. Scopolamine patch placed at 1730 and zofran given at 1711. Orders for phenergan supp.

## 2015-10-19 NOTE — Op Note (Addendum)
Laparoscopic Cholecystectomy  Pre-operative Diagnosis: Gallstone pancreatitis w Chronic cholecystitis                                              Super morbid obesity with a BMI of 61  Post-operative Diagnosis: Same  Procedure:1. Extensive Laparoscopic lyses of adhesions                     2. Laparoscopic cholecystectomy                     3. Placement of a #19 [place] Blake drain RUQ  Surgeon: Sterling Big, MD FACS  Anesthesia: Gen. with endotracheal tube   Findings: Chronic Cholecystitis  Extensive and thick adhesions from the GB to omentum and from duodenum to omentum Fatty liver Difficult procedure secondary to inflammatory response and more importantly to  body habitus, this case took significant time due to above issues but we were able to perform her procedure safely.  Estimated Blood Loss: 250cc       Specimens: Gallbladder           Complications: none   Procedure Details  The patient was seen again in the Holding Room. The benefits, complications, treatment options, and expected outcomes were discussed with the patient. The risks of bleeding, infection, recurrence of symptoms, failure to resolve symptoms, bile duct damage, bile duct leak, retained common bile duct stone, bowel injury, any of which could require further surgery and/or ERCP, stent, or papillotomy were reviewed with the patient. The likelihood of improving the patient's symptoms with return to their baseline status is good.  The patient and/or family concurred with the proposed plan, giving informed consent.  The patient was taken to Operating Room, identified as Linda Ortiz and the procedure verified as Laparoscopic Cholecystectomy.  A Time Out was held and the above information confirmed.  Prior to the induction of general anesthesia, antibiotic prophylaxis was administered. VTE prophylaxis was in place. General endotracheal anesthesia was then administered and tolerated well. After the induction, the  abdomen was prepped with Chloraprep and draped in the sterile fashion. The patient was positioned in the supine position.  Local anesthetic  was injected into the skin near the umbilicus and an incision made. Cut down technique was used to enter the abdominal cavity and a Hasson trochar was placed after two vicryl stitches were anchored to the fascia. Pneumoperitoneum was then created with CO2 and tolerated well without any adverse changes in the patient's vital signs.  Three 5-mm ports were placed in the right upper quadrant all under direct vision. All skin incisions  were infiltrated with a local anesthetic agent before making the incision and placing the trocars.   The patient was positioned  in reverse Trendelenburg, tilted slightly to the patient's left. There were significant adhesions from the gallbladder to the omentum, from the omentum to the liver from duodenum to the gallbladder. Internal lysis was performed using a combination of blunt dissection, sharp scissors and electrocautery.. Once we have an adequate window dissection The gallbladder was identified, the fundus grasped and retracted cephalad. An additional fifth trocar needed to be placed to be able to push down the duodenum and the greater omentum given the patient's super morbid obesity  Adhesions were lysed bluntly. The infundibulum was grasped and retracted laterally, exposing the peritoneum overlying the triangle of  Calot. This was then divided and exposed in a blunt fashion. An extended critical view of the cystic duct and cystic artery was obtained.  The cystic duct was clearly identified and bluntly dissected.   Artery and duct were double clipped and divided.  The gallbladder was taken from the gallbladder fossa in a retrograde fashion with the electrocautery. The gallbladder was removed and placed in an Endocatch bag. The liver bed was irrigated and inspected. Hemostasis was achieved with the electrocautery, there was some oozing  from the liver bed likely secondary to the high dose of Lovenox at that she was in. I placed 2 small Surgicel strips on the liver bed and held pressure for 5 minutes. Copious irrigation was utilized and was repeatedly aspirated until clear.  The gallbladder and Endocatch sac were then removed through the epigastric port site.    Inspection of the right upper quadrant was performed. No bleeding, bile duct injury or leak, or bowel injury was noted. Pneumoperitoneum was released.  The periumbilical port site was closed with interrumted 0 Vicryl sutures.. She coughed while she was trying to waking up and actually ripped couple of sutures from my epigastric port where the fascia was closed. I went back on close the defect with interrupted #1 PDS sutures.   4-0 subcuticular Monocryl was used to close the skin. Dermabond was  applied.  The patient was then extubated and brought to the recovery room in stable condition. Sponge, lap, and needle counts were correct at closure and at the conclusion of the case.               Sterling Bigiego Pabon, MD, FACS

## 2015-10-19 NOTE — Transfer of Care (Signed)
Immediate Anesthesia Transfer of Care Note  Patient: Linda Ortiz  Procedure(s) Performed: Procedure(s): LAPAROSCOPIC CHOLECYSTECTOMY  (N/A)  Patient Location: PACU  Anesthesia Type:General  Level of Consciousness: awake, alert  and oriented  Airway & Oxygen Therapy: Patient Spontanous Breathing and Patient connected to face mask oxygen  Post-op Assessment: Report given to RN and Post -op Vital signs reviewed and stable  Post vital signs: Reviewed and stable  Last Vitals:  Filed Vitals:   10/19/15 1036 10/19/15 1325  BP: 150/91 108/96  Pulse: 62 80  Temp: 36.4 C 36.3 C  Resp: 18 15    Last Pain:  Filed Vitals:   10/19/15 1330  PainSc: Asleep      Patients Stated Pain Goal: 0 (10/19/15 1036)  Complications: No apparent anesthesia complications

## 2015-10-19 NOTE — Anesthesia Postprocedure Evaluation (Signed)
Anesthesia Post Note  Patient: Graylin Shiverlizabeth J Pesce  Procedure(s) Performed: Procedure(s) (LRB): LAPAROSCOPIC CHOLECYSTECTOMY  (N/A)  Patient location during evaluation: PACU Anesthesia Type: General Level of consciousness: awake and alert Pain management: pain level controlled Vital Signs Assessment: post-procedure vital signs reviewed and stable Respiratory status: spontaneous breathing, nonlabored ventilation, respiratory function stable and patient connected to nasal cannula oxygen Cardiovascular status: blood pressure returned to baseline and stable Postop Assessment: no signs of nausea or vomiting Anesthetic complications: no    Last Vitals:  Filed Vitals:   10/19/15 1632 10/19/15 1727  BP: 170/106 162/88  Pulse: 70 64  Temp:    Resp: 17 12    Last Pain:  Filed Vitals:   10/19/15 1729  PainSc: 4                  Loye Reininger S

## 2015-10-19 NOTE — Anesthesia Preprocedure Evaluation (Signed)
Anesthesia Evaluation  Patient identified by MRN, date of birth, ID band Patient awake    Reviewed: Allergy & Precautions, NPO status , Patient's Chart, lab work & pertinent test results, reviewed documented beta blocker date and time   Airway Mallampati: III  TM Distance: >3 FB     Dental  (+) Chipped   Pulmonary Current Smoker,           Cardiovascular      Neuro/Psych PSYCHIATRIC DISORDERS Anxiety Depression    GI/Hepatic   Endo/Other  Morbid obesity  Renal/GU Renal InsufficiencyRenal disease     Musculoskeletal   Abdominal   Peds  Hematology   Anesthesia Other Findings Hypertensive this am.  Reproductive/Obstetrics                             Anesthesia Physical Anesthesia Plan  ASA: III  Anesthesia Plan: General   Post-op Pain Management:    Induction: Intravenous  Airway Management Planned: Oral ETT  Additional Equipment:   Intra-op Plan:   Post-operative Plan:   Informed Consent: I have reviewed the patients History and Physical, chart, labs and discussed the procedure including the risks, benefits and alternatives for the proposed anesthesia with the patient or authorized representative who has indicated his/her understanding and acceptance.     Plan Discussed with: CRNA  Anesthesia Plan Comments:         Anesthesia Quick Evaluation

## 2015-10-20 LAB — COMPREHENSIVE METABOLIC PANEL
ALBUMIN: 3.3 g/dL — AB (ref 3.5–5.0)
ALT: 158 U/L — AB (ref 14–54)
AST: 112 U/L — AB (ref 15–41)
Alkaline Phosphatase: 157 U/L — ABNORMAL HIGH (ref 38–126)
Anion gap: 10 (ref 5–15)
BILIRUBIN TOTAL: 0.8 mg/dL (ref 0.3–1.2)
BUN: 8 mg/dL (ref 6–20)
CALCIUM: 8.6 mg/dL — AB (ref 8.9–10.3)
CHLORIDE: 103 mmol/L (ref 101–111)
CO2: 26 mmol/L (ref 22–32)
Creatinine, Ser: 0.87 mg/dL (ref 0.44–1.00)
GFR calc Af Amer: >60 mL/min (ref >60)
GLUCOSE: 114 mg/dL — AB (ref 65–99)
POTASSIUM: 3.8 mmol/L (ref 3.5–5.1)
Sodium: 139 mmol/L (ref 135–145)
Total Protein: 6.1 g/dL — ABNORMAL LOW (ref 6.5–8.1)

## 2015-10-20 MED ORDER — OXYCODONE HCL 5 MG PO TABS: 5.0000 mg | ORAL_TABLET | ORAL | Status: DC | PRN

## 2015-10-20 MED ORDER — PROMETHAZINE HCL 25 MG RE SUPP: 25.0000 mg | Freq: Three times a day (TID) | RECTAL | Status: DC | PRN

## 2015-10-20 MED ORDER — ONDANSETRON HCL 4 MG PO TABS: 4.0000 mg | ORAL_TABLET | ORAL | Status: DC | PRN

## 2015-10-20 NOTE — Progress Notes (Signed)
Patient given prescription and discharge summary. Iv site removed. Concerns addressed. JP output recording paper given. Discharged via auxiliary.

## 2015-10-21 ENCOUNTER — Ambulatory Visit: Payer: Self-pay | Admitting: Obstetrics and Gynecology

## 2015-10-21 LAB — SURGICAL PATHOLOGY

## 2015-10-21 NOTE — Discharge Summary (Addendum)
Patient ID: MERIKAY LESNIEWSKI MRN: [id number] DOB/AGE: 43-May-1972 43 y.o.  Admit date: 10/14/2015 Discharge date: 10/20/2015   Discharge Diagnoses:  Principal Problem:   Pancreatitis, acute Active Problems:   Generalized anxiety disorder   Depression   Gallstones   AKI (acute kidney injury) (HCC)   Acute gallstone pancreatitis   Procedures:Lap chole  Hospital Course: 45 yo supermorbidly obese female presented with gallstone pancreatitis. Hx of gastric bypass. Had preop ERCP showing no evidence of CBD pathology. Underwent a lap chole once her pancreatitis resolved. She was kept overnight after her chole. At the time of DC she was ambulating, taking PO, AVSS, minimal serous output from drain, her abdomen was soft, incisions c/d/i, no infection. Condition of the pt at DC is stable.   Disposition: 01-Home or Self Care  Discharge Instructions    Call MD for:  difficulty breathing, headache or visual disturbances    Complete by:  As directed      Call MD for:  extreme fatigue    Complete by:  As directed      Call MD for:  hives    Complete by:  As directed      Call MD for:  persistant dizziness or light-headedness    Complete by:  As directed      Call MD for:  persistant nausea and vomiting    Complete by:  As directed      Call MD for:  redness, tenderness, or signs of infection (pain, swelling, redness, odor or green/yellow discharge around incision site)    Complete by:  As directed      Call MD for:  severe uncontrolled pain    Complete by:  As directed      Call MD for:  temperature >100.4    Complete by:  As directed      Diet - low sodium heart healthy    Complete by:  As directed      Discharge instructions    Complete by:  As directed   Empty JP BID, shower tomorrow. No heavy lifting     Increase activity slowly    Complete by:  As directed      Lifting restrictions    Complete by:  As directed   20 lbs            Medication List    STOP taking these  medications        zolpidem 10 MG tablet  Commonly known as:  AMBIEN      TAKE these medications        CULTURELLE DIGESTIVE HEALTH Caps  Take 1 capsule by mouth 2 (two) times daily.     lamoTRIgine 150 MG tablet  Commonly known as:  LAMICTAL  Take 150 mg by mouth at bedtime.     Na Sulfate-K Sulfate-Mg Sulf Soln  Commonly known as:  SUPREP BOWEL PREP  Take 1 Bottle by mouth as directed. Please take one bottle at 5:00 PM the day before procedure and take second bottle 4 hours prior to your procedure.     ondansetron 4 MG tablet  Commonly known as:  ZOFRAN  Take 1 tablet (4 mg total) by mouth every 4 (four) hours as needed for nausea.     oxyCODONE 5 MG immediate release tablet  Commonly known as:  Oxy IR/ROXICODONE  Take 1 tablet (5 mg total) by mouth every 4 (four) hours as needed for moderate pain.     promethazine 25 MG tablet  Commonly known as:  PHENERGAN  Take 25 mg by mouth every 6 (six) hours as needed for nausea or vomiting.     promethazine 25 MG suppository  Commonly known as:  PHENERGAN  Place 1 suppository (25 mg total) rectally every 8 (eight) hours as needed for nausea or vomiting.     venlafaxine 75 MG tablet  Commonly known as:  EFFEXOR  Take 1 tablet (75 mg total) by mouth 2 (two) times daily.           Follow-up Information    Follow up with Buren Havey, [place] Go on 10/28/2015.   Why:  @2 :45p   Contact information:   [address] [place], [place] [address] [phone]       Sterling Bigiego Danh Bayus, MD FACS

## 2015-10-23 ENCOUNTER — Ambulatory Visit: Payer: BC Managed Care – PPO

## 2015-10-23 ENCOUNTER — Telehealth: Payer: Self-pay | Admitting: Surgery

## 2015-10-23 NOTE — Telephone Encounter (Signed)
Serosangious drainage around incision site, only drained 2cc from JP drain within the last 2 days. Denies any other problems.  Spoke with Dr. Everlene FarrierPabon and he would like for patient to come in today and have JP pulled as nurse visit.

## 2015-10-23 NOTE — Telephone Encounter (Signed)
Returned phone call to patient. She will come in for nurse visit at 2pm today.

## 2015-10-23 NOTE — Telephone Encounter (Signed)
Returned phone call to patient at this time. No answer. Left voicemail to return phone call. 

## 2015-10-23 NOTE — Telephone Encounter (Signed)
Patient has called and would like to discuss the J.P. drain output. She states that there is leaking from the incision site. Only 2.5 CC has drained in the last 24 hours.   Surgery with Dr Everlene FarrierPabon on 10/19/15--Extensive Laparoscopic lyses of adhesions, Laparoscopic cholecystectomy, Placement of a #19 [place] Blake drain RUQ.

## 2015-10-27 ENCOUNTER — Ambulatory Visit
Admission: RE | Admit: 2015-10-27 | Payer: BC Managed Care – PPO | Source: Ambulatory Visit | Admitting: Gastroenterology

## 2015-10-27 ENCOUNTER — Encounter: Admission: RE | Payer: Self-pay | Source: Ambulatory Visit

## 2015-10-27 SURGERY — COLONOSCOPY WITH PROPOFOL
Anesthesia: General

## 2015-10-28 ENCOUNTER — Ambulatory Visit (INDEPENDENT_AMBULATORY_CARE_PROVIDER_SITE_OTHER): Payer: BC Managed Care – PPO | Admitting: Surgery

## 2015-10-28 ENCOUNTER — Encounter: Payer: Self-pay | Admitting: Surgery

## 2015-10-28 VITALS — BP 138/93 | HR 74 | Temp 98.0°F | Ht 64.0 in | Wt 353.5 lb

## 2015-10-28 DIAGNOSIS — Z09 Encounter for follow-up examination after completed treatment for conditions other than malignant neoplasm: Secondary | ICD-10-CM

## 2015-10-28 NOTE — Progress Notes (Signed)
S/p lap chole Doing very well Taking PO , no complaints  PE NAD Abd: soft, NT, incisions c/d/i, no infection  Path d/w pt  A/p Doing well No complications No heavy lifting RTC prn

## 2015-10-28 NOTE — Patient Instructions (Signed)
Please call our office if you have questions or concerns.   

## 2015-10-30 ENCOUNTER — Telehealth: Payer: Self-pay

## 2015-10-30 NOTE — Telephone Encounter (Signed)
Called and left patient a voicemail to let her know that her FMLA was filled out and faxed to [phone].

## 2015-11-03 ENCOUNTER — Telehealth: Payer: Self-pay | Admitting: Obstetrics and Gynecology

## 2015-11-03 NOTE — Telephone Encounter (Signed)
PT IS REQUIRING ABOUT A LUPRON INJ. SHE IS HAVING ISSUES WITH BLEEDING AGAIN, AND CRAMPY OVARIAN STUFF. LUPRON WORKS GREAT AND SHE WANTED TO KNOW IF SHE NEEDED AN APPT TO START BACK ON THEM

## 2015-11-04 NOTE — Telephone Encounter (Signed)
Yes the patient needs a follow up appt, please remind the pt that she will need to bring her co pay.

## 2015-11-06 NOTE — Telephone Encounter (Signed)
Left msg with this pt, she already had scheduled a fu and I told her to bring co pay/ :)

## 2015-11-11 ENCOUNTER — Ambulatory Visit: Payer: Self-pay | Admitting: General Surgery

## 2015-12-01 ENCOUNTER — Encounter: Payer: Self-pay | Admitting: Obstetrics and Gynecology

## 2015-12-01 ENCOUNTER — Ambulatory Visit (INDEPENDENT_AMBULATORY_CARE_PROVIDER_SITE_OTHER): Payer: BC Managed Care – PPO | Admitting: Obstetrics and Gynecology

## 2015-12-01 VITALS — BP 116/82 | HR 75 | Ht 64.0 in | Wt 347.8 lb

## 2015-12-01 DIAGNOSIS — N809 Endometriosis, unspecified: Secondary | ICD-10-CM | POA: Diagnosis not present

## 2015-12-01 DIAGNOSIS — Z6841 Body Mass Index (BMI) 40.0 and over, adult: Secondary | ICD-10-CM | POA: Diagnosis not present

## 2015-12-01 DIAGNOSIS — R102 Pelvic and perineal pain: Secondary | ICD-10-CM | POA: Diagnosis not present

## 2015-12-01 MED ORDER — MEDROXYPROGESTERONE ACETATE 5 MG PO TABS: 5.0000 mg | ORAL_TABLET | Freq: Every day | ORAL | Status: DC

## 2015-12-01 NOTE — Progress Notes (Signed)
    GYNECOLOGY PROGRESS NOTE  Subjective:    Patient ID: Linda Ortiz, female    DOB: 01/25/1971, 45 y.o.   MRN: [id number]  HPI  Patient is a 45 y.o. G0P0000 female who presents for complaints of left lower quadrant pain, intermittent, cyclic.  Patient was previously treated with Lupron for symptoms suspicious for endometriosis which worked well while she was on it.  Last injection was approximately January 2017. Notes that since that time she had been doing ok, until the end of April when she began to note intensified pain with associated nausea/vomiting.  Was seen in the ER several days later and was diagnosed with cholecystitis and gallstone pancreatitis. Had cholecystectomy performed.  Also notes she was diagnosed with diverticulosis in March. States that the pain she feels now is more like the pain she was feeling before starting the Lupron.  Reports that periods have also become more irregular again, previously was not having cycles on Lupron.   Of note, patient  The following portions of the patient's history were reviewed and updated as appropriate: allergies, current medications, past family history, past medical history, past social history, past surgical history and problem list.  Review of Systems Pertinent items noted in HPI and remainder of comprehensive ROS otherwise negative.   Objective:   Blood pressure 116/82, pulse 75, height 5\' 4"  (1.626 m), weight 347 lb 12.8 oz (157.761 kg), last menstrual period 10/01/2015. General appearance: alert and no distress Abdomen: soft, mildly tender in LLQ, no rebound, gaurding, no masses.  Pelvic: external genitalia normal, rectovaginal septum normal.  Vagina without discharge.  Cervix normal appearing, no lesions and no motion tenderness.  Uterus mobile, nontender, normal shape and size.  Adnexae non-palpable, nontender bilaterally.  Extremities: extremities normal, atraumatic, no cyanosis or edema Neurologic: Grossly  normal   Assessment:   Pelvic pain in female (LLQ) Suspected endometriosis Morbid obesity  Plan:   Discussion had on management options for pain, likely secondary to suspected endometriosis. Has tried several forms of contraception in the past without success, declined IUD.  Discussed trial of Provera 5 mg tablets vs progesterone OCPs vs 2nd round of Lupron.  Patient desires to try Provera tablets. Will prescribe.  Will f/u in 2-3 months for annual exam and f/u medications.    Hildred LaserAnika Michaeline Eckersley, MD [facility]

## 2015-12-07 ENCOUNTER — Telehealth: Payer: Self-pay | Admitting: *Deleted

## 2015-12-07 DIAGNOSIS — F411 Generalized anxiety disorder: Secondary | ICD-10-CM

## 2015-12-07 NOTE — Telephone Encounter (Signed)
Yes, we can give a 3 month supply until patient sees her other providers.

## 2015-12-07 NOTE — Telephone Encounter (Signed)
pls advise

## 2015-12-07 NOTE — Telephone Encounter (Signed)
Patient states that her current psychiatrist left and she has an appt with a new psychiatrist on 01/12/16. She was wondering if Melody can prescribe her  Lamictal and Effector  ( both providers left that prescribed the medications) . Patient doesn't  have an appt with her PCP until  12/28/15. Patient is requesting a call back [phone].

## 2015-12-08 ENCOUNTER — Telehealth: Payer: Self-pay | Admitting: General Surgery

## 2015-12-08 MED ORDER — VENLAFAXINE HCL 75 MG PO TABS: 75.0000 mg | ORAL_TABLET | Freq: Two times a day (BID) | ORAL | Status: DC

## 2015-12-08 MED ORDER — LAMOTRIGINE 150 MG PO TABS: 150.0000 mg | ORAL_TABLET | Freq: Every day | ORAL | Status: DC

## 2015-12-08 NOTE — Telephone Encounter (Signed)
Called patient once again at this time. No answer. Left voicemail stating that I cannot send in any antibiotics until I verify her pharmacy and speak with her about her symptoms. Asked for a return phone call again.

## 2015-12-08 NOTE — Telephone Encounter (Signed)
Returned phone call to patient at this time. No answer. Left voicemail for return phone call. 

## 2015-12-08 NOTE — Telephone Encounter (Signed)
Patient has been seen by Dr. Everlene FarrierPabon and Tonita CongWoodham for diverticulitis. She feels she has the symptoms again and wants to know if she should be on an antibiotic? Please call.

## 2015-12-08 NOTE — Telephone Encounter (Signed)
Done. Pt informed via my chart.

## 2015-12-09 MED ORDER — METRONIDAZOLE 500 MG PO TABS: 500.0000 mg | ORAL_TABLET | Freq: Three times a day (TID) | ORAL | Status: DC

## 2015-12-09 MED ORDER — FLUCONAZOLE 150 MG PO TABS: 150.0000 mg | ORAL_TABLET | Freq: Every day | ORAL | Status: DC

## 2015-12-09 MED ORDER — CIPROFLOXACIN HCL 500 MG PO TABS: 500.0000 mg | ORAL_TABLET | Freq: Two times a day (BID) | ORAL | Status: DC

## 2015-12-09 NOTE — Telephone Encounter (Addendum)
LLQ abdominal pain x 4 days becoming progressively worse. States that it is better this morning than yesterday after changing diet to very bland and soft. Denies fever. Denies Nausea and vomiting. Pain feels very similar to last time she had diverticulitis.  Spoke with Dr. Tonita CongWoodham regarding patient. He has ordered Cipro and Flagyl to be started immediately and pt needs to follow-up in clinic this week.  [facility] [address] [facility]. Pt scheduled to see Dr. Orvis BrillLoflin tomorrow.  Patient asked for Diflucan to also be called in. This was sent to preferred pharmacy as well.

## 2015-12-10 ENCOUNTER — Other Ambulatory Visit
Admission: RE | Admit: 2015-12-10 | Discharge: 2015-12-10 | Disposition: A | Discharge status: Home / Self Care | Payer: BC Managed Care – PPO | Source: Ambulatory Visit | Attending: Surgery | Admitting: Surgery

## 2015-12-10 ENCOUNTER — Encounter: Payer: Self-pay | Admitting: Surgery

## 2015-12-10 ENCOUNTER — Ambulatory Visit
Admission: RE | Admit: 2015-12-10 | Discharge: 2015-12-10 | Disposition: A | Discharge status: Home / Self Care | Payer: BC Managed Care – PPO | Source: Ambulatory Visit | Attending: Surgery | Admitting: Surgery

## 2015-12-10 ENCOUNTER — Ambulatory Visit (INDEPENDENT_AMBULATORY_CARE_PROVIDER_SITE_OTHER): Payer: BC Managed Care – PPO | Admitting: Surgery

## 2015-12-10 VITALS — BP 156/101 | HR 85 | Temp 98.5°F | Ht 64.0 in | Wt 349.5 lb

## 2015-12-10 DIAGNOSIS — K439 Ventral hernia without obstruction or gangrene: Secondary | ICD-10-CM | POA: Insufficient documentation

## 2015-12-10 DIAGNOSIS — K5720 Diverticulitis of large intestine with perforation and abscess without bleeding: Secondary | ICD-10-CM

## 2015-12-10 DIAGNOSIS — R1032 Left lower quadrant pain: Secondary | ICD-10-CM | POA: Diagnosis not present

## 2015-12-10 DIAGNOSIS — M549 Dorsalgia, unspecified: Secondary | ICD-10-CM | POA: Diagnosis not present

## 2015-12-10 LAB — CBC WITH DIFFERENTIAL/PLATELET
Basophils Absolute: 0 10*3/uL (ref 0–0.1)
Basophils Relative: 1 %
EOS PCT: 2 %
Eosinophils Absolute: 0.2 10*3/uL (ref 0–0.7)
HCT: 42 % (ref 35.0–47.0)
Hemoglobin: 14.5 g/dL (ref 12.0–16.0)
LYMPHS ABS: 1.7 10*3/uL (ref 1.0–3.6)
LYMPHS PCT: 16 %
MCH: 30.7 pg (ref 26.0–34.0)
MCHC: 34.6 g/dL (ref 32.0–36.0)
MCV: 88.7 fL (ref 80.0–100.0)
MONO ABS: 0.5 10*3/uL (ref 0.2–0.9)
Monocytes Relative: 5 %
Neutro Abs: 8.1 10*3/uL — ABNORMAL HIGH (ref 1.4–6.5)
Neutrophils Relative %: 76 %
PLATELETS: 320 10*3/uL (ref 150–440)
RBC: 4.73 MIL/uL (ref 3.80–5.20)
RDW: 13.4 % (ref 11.5–14.5)
WBC: 10.6 10*3/uL (ref 3.6–11.0)

## 2015-12-10 LAB — COMPREHENSIVE METABOLIC PANEL
ALBUMIN: 4 g/dL (ref 3.5–5.0)
ALT: 27 U/L (ref 14–54)
ANION GAP: 8 (ref 5–15)
AST: 24 U/L (ref 15–41)
Alkaline Phosphatase: 101 U/L (ref 38–126)
BILIRUBIN TOTAL: 0.3 mg/dL (ref 0.3–1.2)
BUN: 12 mg/dL (ref 6–20)
CHLORIDE: 104 mmol/L (ref 101–111)
CO2: 23 mmol/L (ref 22–32)
Calcium: 9 mg/dL (ref 8.9–10.3)
Creatinine, Ser: 0.87 mg/dL (ref 0.44–1.00)
GFR calc Af Amer: >60 mL/min (ref >60)
GFR calc non Af Amer: >60 mL/min (ref >60)
GLUCOSE: 100 mg/dL — AB (ref 65–99)
POTASSIUM: 4.1 mmol/L (ref 3.5–5.1)
SODIUM: 135 mmol/L (ref 135–145)
TOTAL PROTEIN: 6.8 g/dL (ref 6.5–8.1)

## 2015-12-10 LAB — URINALYSIS COMPLETE WITH MICROSCOPIC (ARMC ONLY)
BILIRUBIN URINE: NEGATIVE
Bacteria, UA: NONE SEEN
Glucose, UA: NEGATIVE mg/dL
HGB URINE DIPSTICK: NEGATIVE
KETONES UR: NEGATIVE mg/dL
Nitrite: NEGATIVE
PH: 5 (ref 5.0–8.0)
Protein, ur: NEGATIVE mg/dL
Specific Gravity, Urine: 1.02 (ref 1.005–1.030)

## 2015-12-10 MED ORDER — IOPAMIDOL (ISOVUE-300) INJECTION 61%
125.0000 mL | Freq: Once | INTRAVENOUS | Status: AC | PRN
  Administered 2015-12-10: 125 mL via INTRAVENOUS

## 2015-12-10 NOTE — Patient Instructions (Signed)
You will need to go to the Registration desk in the Medical Mall right after your appointment today to have Labs, CT Scan, and Urine.  Directions to Medical Mall: When leaving our office, go right. Go all of the way down to the very end of the hallway. You will have a purple wall in front of you. You will now have a tunnel to the hospital on your left hand side. Go through this tunnel and the elevators will be on your left. Go down to the 1st floor and take a slight left. The very first desk on the right hand side is the registration desk.  The CT tech will hold you in an area after the CT until results is given to the Surgeon on Call and they have verified that you may be released to go home.  If this is the case, you will continue on your antibiotics as scheduled now and I will call you tomorrow with a follow-up appointment and to discuss your Colonoscopy scheduling.

## 2015-12-10 NOTE — Progress Notes (Signed)
Subjective:     Patient ID: Linda Ortiz, female   DOB: 10/17/1970, 45 y.o.   MRN: [id number]  HPI   45 yr old female With multiple medical problems who has had diverticulitis in the past. Patient states that a couple days ago she began having pain in the left lower quadrant as well as back pain and some nausea as well as some vomiting. Patient states that she's been avoiding nuts seeds and eating high-fiber foods and having good bowel movements daily. Patient states she drinks plenty of water a day has not had been having a problem with any constipation. Patient states that the only difference in what she's eating this that she had some cherries last week. Patient did start her on the Cipro and Flagyl yesterday when she called it. Patient has not had a colonoscopy, we tried to schedule it however she got acute cholecystitis 2 days prior and so has not been able to get her scheduled for that. Otherwise patient has gone about 3 months without about it without pain. Asian does endorse fever and chills nausea and vomiting however she denies any diarrhea or constipation and any dysuria.  Past Medical History  Diagnosis Date  . Anxiety   . PCOS (polycystic ovarian syndrome)   . Endometriosis   . Morbid obesity (HCC)   . Menorrhagia   . Dysmenorrhea   . Diverticulitis   . Pancreatitis, acute   . Rib fracture     Left   . Pneumonia   . Hematoma of right lower extremity    Past Surgical History  Procedure Laterality Date  . Bariatric surgery  2007  . Arthroscopy knee w/ drilling    . Appendectomy  2005  . Cholecystectomy N/A 10/19/2015    Procedure: LAPAROSCOPIC CHOLECYSTECTOMY ;  Surgeon: Leafy Roiego F Pabon, MD;  Location: [facility] ORS;  Service: General;  Laterality: N/A;  . Eye surgery  2017   Family History  Problem Relation Age of Onset  . Arthritis Mother     rheumatoid  . Hypertension Mother   . Hyperlipidemia Father   . Mental illness Maternal Grandmother     dementia  . Alcohol abuse  Maternal Grandmother   . ALS Paternal Grandmother    Social History   Social History  . Marital Status: Single    Spouse Name: N/A  . Number of Children: N/A  . Years of Education: N/A   Social History Main Topics  . Smoking status: Former Smoker -- 0.25 packs/day for 8 years    Types: Cigarettes    Quit date: 09/12/2015  . Smokeless tobacco: Never Used  . Alcohol Use: No  . Drug Use: No  . Sexual Activity: Yes   Other Topics Concern  . None   Social History Narrative    Current outpatient prescriptions:  .  cetirizine (ZYRTEC) 10 MG tablet, Take 10 mg by mouth daily., Disp: , Rfl:  .  cholecalciferol (VITAMIN D) 1000 units tablet, Take 1,000 Units by mouth daily., Disp: , Rfl:  .  ciprofloxacin (CIPRO) 500 MG tablet, Take 1 tablet (500 mg total) by mouth 2 (two) times daily., Disp: 20 tablet, Rfl: 0 .  fluconazole (DIFLUCAN) 150 MG tablet, Take 1 tablet (150 mg total) by mouth daily., Disp: 2 tablet, Rfl: 0 .  Lactobacillus-Inulin (CULTURELLE DIGESTIVE HEALTH) CAPS, Take 1 capsule by mouth 2 (two) times daily., Disp: , Rfl:  .  lamoTRIgine (LAMICTAL) 150 MG tablet, Take 1 tablet (150 mg total) by mouth at  bedtime., Disp: 30 tablet, Rfl: 3 .  medroxyPROGESTERone (PROVERA) 5 MG tablet, Take 1 tablet (5 mg total) by mouth daily., Disp: 30 tablet, Rfl: 11 .  metroNIDAZOLE (FLAGYL) 500 MG tablet, Take 1 tablet (500 mg total) by mouth 3 (three) times daily., Disp: 42 tablet, Rfl: 0 .  ondansetron (ZOFRAN) 4 MG tablet, Take 1 tablet (4 mg total) by mouth every 4 (four) hours as needed for nausea., Disp: 20 tablet, Rfl: 0 .  venlafaxine (EFFEXOR) 75 MG tablet, Take 1 tablet (75 mg total) by mouth 2 (two) times daily., Disp: 60 tablet, Rfl: 3 .  zolpidem (AMBIEN) 10 MG tablet, Take 10 mg by mouth at bedtime., Disp: , Rfl: 3 No Known Allergies     Review of Systems  Constitutional: Positive for fever, chills, activity change, appetite change and fatigue.  HENT: Negative for  congestion and nosebleeds.   Respiratory: Negative for cough, choking, chest tightness, shortness of breath and wheezing.   Cardiovascular: Negative for chest pain, palpitations and leg swelling.  Gastrointestinal: Positive for nausea, vomiting, abdominal pain and abdominal distention. Negative for diarrhea, constipation and blood in stool.  Genitourinary: Negative for dysuria, urgency and hematuria.  Musculoskeletal: Positive for back pain. Negative for joint swelling and neck pain.  Skin: Negative for color change, pallor, rash and wound.  Hematological: Negative for adenopathy. Does not bruise/bleed easily.  Psychiatric/Behavioral: Negative for agitation. The patient is not nervous/anxious.   All other systems reviewed and are negative.      Filed Vitals:   12/10/15 1440  BP: 156/101  Pulse: 85  Temp: 98.5 F (36.9 C)    Objective:   Physical Exam  Constitutional: She is oriented to person, place, and time. She appears well-developed and well-nourished. No distress.  HENT:  Head: Normocephalic and atraumatic.  Right Ear: External ear normal.  Left Ear: External ear normal.  Nose: Nose normal.  Mouth/Throat: Oropharynx is clear and moist. No oropharyngeal exudate.  Eyes: Conjunctivae and EOM are normal. Pupils are equal, round, and reactive to light. No scleral icterus.  Neck: Normal range of motion. Neck supple. No tracheal deviation present.  Cardiovascular: Normal rate, regular rhythm, normal heart sounds and intact distal pulses.  Exam reveals no gallop and no friction rub.   No murmur heard. Pulmonary/Chest: Effort normal and breath sounds normal. No respiratory distress. She has no wheezes. She has no rales.  Abdominal: Soft. Bowel sounds are normal. She exhibits no distension. There is tenderness. There is no rebound and no guarding.  Obese, LLQ abdominal pain, well healed lap chole incision sites  Musculoskeletal: Normal range of motion. She exhibits no edema or  tenderness.  Neurological: She is alert and oriented to person, place, and time.  Skin: Skin is warm and dry. No rash noted. No erythema. No pallor.  Psychiatric: She has a normal mood and affect. Her behavior is normal. Judgment and thought content normal.  Vitals reviewed.      Assessment:     45 yr old with recurrent diverticulitis     Plan:     I personally reviewed the patient's past medical history including her past bouts of diverticulitis past hospitalizations for this. I have personally reviewed the patient's laboratory values and she has not had any since May where she did have some elevated liver enzymes at that time. I have also reviewed her past CT scan images which did show a microperforation at that time. Given the patient's recurrence of symptoms I will send the patient  today for a CBC and a CMP as well as a UA given her back pain. Given that the last time she was having these symptoms she had a perforation on Center for CT scan as well. I discussed with the patient that if her CT scan shows further rupture or an abscess that she will need to be admitted. I spoke him with my partner Dr. Tonita Cong who is in the hospital and he will be following up on these labs and CT scan images and admit her if needed.

## 2015-12-11 ENCOUNTER — Other Ambulatory Visit: Payer: Self-pay

## 2015-12-11 ENCOUNTER — Telehealth: Payer: Self-pay | Admitting: Surgery

## 2015-12-11 DIAGNOSIS — K5720 Diverticulitis of large intestine with perforation and abscess without bleeding: Secondary | ICD-10-CM

## 2015-12-11 MED ORDER — CYCLOBENZAPRINE HCL 10 MG PO TABS: 10.0000 mg | ORAL_TABLET | Freq: Three times a day (TID) | ORAL | Status: DC | PRN

## 2015-12-11 NOTE — Telephone Encounter (Signed)
Discussed Results with Dr. Orvis BrillLoflin in clinic. She would like patient to continue Antibiotics until they are gone, ordered Flexeril for patient's muscle spasms, and would like for her to follow-up next week in clinic. Colonoscopy needs to be scheduled in 4-6 weeks.  Returned phone call to patient at this time. Reviewed labs, urine, and CT Scan. Explained to patient that I would like for her to follow-up with Dr. Everlene FarrierPabon and she states that she is going on vacation next week and would like to make appointment in 2 weeks. Appointment was made for next week with Dr. Everlene FarrierPabon and explained that if she is feeling better, she may call and move appointment to the next week. Asked patient to call with any questions or concerns that she has. She verbalizes understanding of this.  Flexeril sent to preferred pharmacy per Dr. Orvis BrillLoflin.  [facility]. Orders placed. Information mailed. Patient has Suprep prescription from previously scheduled Colonoscopy.  Will await [facility] to make sure nothing further is needed for patient.

## 2015-12-11 NOTE — Telephone Encounter (Signed)
Patient calling again to check on the results of her CT Scan. She also requests medication: something for pain and something for her muscles. Please call and advise.

## 2015-12-11 NOTE — Telephone Encounter (Signed)
Patient has called and would like to know that results of her CT scan and labs. I have advised her that once the results are reviewed that the nurse will call her back to discuss the results. Patient's phone number was verified.

## 2015-12-12 LAB — URINE CULTURE: Culture: NO GROWTH

## 2015-12-17 ENCOUNTER — Ambulatory Visit: Payer: BC Managed Care – PPO | Admitting: Surgery

## 2015-12-22 ENCOUNTER — Other Ambulatory Visit: Payer: Self-pay | Admitting: Internal Medicine

## 2015-12-22 DIAGNOSIS — F411 Generalized anxiety disorder: Secondary | ICD-10-CM

## 2015-12-22 NOTE — Telephone Encounter (Signed)
These were refilled previously by Anika cherry, Please advise for you to fill them, thanks

## 2015-12-22 NOTE — Telephone Encounter (Signed)
Pt will be coming into office for a physical on 01/21/16 for a physical. Pt wants to have 3  Medications refilled by Dr. Darrick Huntsmanullo. Meds run out on the 20th. She wants these meds switched over to Dr. Darrick Huntsmanullo. They are; venlafaxine (EFFEXOR) 75 MG tablet, lamoTRIgine (LAMICTAL) 150 MG tablet and zolpidem (AMBIEN) 10 MG tablet. Please call pt [phone].

## 2015-12-23 NOTE — Telephone Encounter (Signed)
Hold on.  I have never seen this patient and I do not recall accepting her as a new patient. Please clarify

## 2015-12-23 NOTE — Telephone Encounter (Signed)
Erie NoeVanessa can you assist with this? I am not sure who changed this patient to Dr. Darrick Huntsmanullo.  Thanks

## 2015-12-23 NOTE — Telephone Encounter (Signed)
Patient was an established patient in 2014 with you , but has not followed since. Please advise. thanks

## 2015-12-24 NOTE — Telephone Encounter (Signed)
I do NOT recall acceptin g this patient back,  And if it has been 3 years since she has seen my she is no longer assumed to be my patient.  according to the chart , Dr. Valentino Saxonherry gave her refills through August/September.  Sinc eshe sees Dr Valentino Saxonherry for GYN issues,  She can be established with Dr Birdie SonsSonnenberg or Dr Adriana Simasook

## 2015-12-25 NOTE — Telephone Encounter (Signed)
[name] please advise, thanks

## 2015-12-28 ENCOUNTER — Encounter: Payer: BC Managed Care – PPO | Admitting: Internal Medicine

## 2015-12-29 ENCOUNTER — Telehealth: Payer: Self-pay | Admitting: Obstetrics and Gynecology

## 2015-12-29 ENCOUNTER — Telehealth: Payer: Self-pay | Admitting: *Deleted

## 2015-12-29 NOTE — Telephone Encounter (Signed)
Per notes, patient needs to follow up with another provider, either Rubin Payorook, Sonnenberg or LeetonArnett for that physical that is scheduled in August.  Provider is not responsible for seeing her as it has been over the three years.  thanks

## 2015-12-29 NOTE — Telephone Encounter (Signed)
Patient has requested a medication refill for Ambien, Effexor and Lamictal all generic.  Pharmacy [facility] [address]

## 2015-12-29 NOTE — Telephone Encounter (Signed)
PT IS ON PROGESTERONE AND SHE HAD A PERIOD FOR 8 DAYS VERY HEAVY, SHE WANTS TO KNOW IF SHE SHOULD SUCK IT UP AND DOES SHE NEED MORE PROGESTERONE? PLEASE CALL AND INFROM HER WHAT NEEDS TO HAPPEN FROM HERE.

## 2015-12-29 NOTE — Telephone Encounter (Signed)
I called to schedule the patient with one of our physicians that are taking new patients . She informed me that she did not know either of the providers that I mentioned and that she would call around to find a doctor that she knew.She also informed me that if she did not find anyone that she would be in touch.

## 2015-12-29 NOTE — Telephone Encounter (Signed)
Yes.  Her period being heavier than normal with the Provera is anticipated as she has not had a period in several months.  She doesn't have to take the Provera tablets every month, can go up to 2-3 months between doses, but should not go any further than that as the periods would likely be much heavier again.

## 2015-12-29 NOTE — Telephone Encounter (Signed)
See other note I sent, thanks

## 2015-12-29 NOTE — Telephone Encounter (Signed)
Please Advise

## 2015-12-29 NOTE — Telephone Encounter (Signed)
Appt was cancelled and pt stated she will look around and or call back.

## 2015-12-29 NOTE — Telephone Encounter (Signed)
Lamitcal and Effexor were just refilled by her other doctor for 3months, Please advise patient to get refills I need her to make an appt with a new provider, either Dr. Birdie SonsSonnenberg, Dr. Adriana Simasook or Rennie PlowmanMargaret Arnett. thanks

## 2015-12-30 ENCOUNTER — Encounter: Payer: Self-pay | Admitting: Obstetrics and Gynecology

## 2015-12-31 ENCOUNTER — Telehealth: Payer: Self-pay | Admitting: Obstetrics and Gynecology

## 2015-12-31 NOTE — Telephone Encounter (Signed)
Pt called and has a question about the lupron that she was taking last year, she wanted a call back about the dosage she can take so that she can call the company and let them know. Pt would like a call back today if possible

## 2016-01-01 ENCOUNTER — Telehealth: Payer: Self-pay

## 2016-01-01 NOTE — Telephone Encounter (Signed)
Called pt, no answer. LM for pt letting her know that I would proceed with ordering her Lupron (just so that the process would be initiated) told pt that I forwarded her my chart message to Dr.Cherry as a high priority. Advised pt to call back with any further questions or concerns.

## 2016-01-01 NOTE — Telephone Encounter (Signed)
Called pt she states that she has been "going crazy" on progesterone. Pt states she has been very tearful, angry, and moody. Pt states that she believes this is related to the use of the progesterone and has discontinued use of the drug. Pt is interested in resuming the Lupron as she believes it helps her mental state as well as her physical well being. Pt is aware of the risk for long term osteoporosis and still wishes to proceed. Pt states that she called [employer] (Lupron company) and had lengthy (1 hour) discussion with one of the RNs there. They have come up with the plan of trying the 11.25mg  Lupron for every 3mos vs. Every month and then reevaluating sx after 2 injections in 6mos. RN recommends pt having a bone density scan at her upcoming annual appt in September with Dr.Kline pt is accepting of this. Will place order for Lupron and have pt added to nurse schedule to receive injection. Will send message to provider for review.

## 2016-01-01 NOTE — Telephone Encounter (Signed)
Yes. I discussed this with her in a Mychart message yesterday.  This is fine.  Just let her know when the medication arrives.

## 2016-01-01 NOTE — Telephone Encounter (Signed)
See MyChart message

## 2016-01-04 ENCOUNTER — Other Ambulatory Visit: Payer: Self-pay

## 2016-01-04 ENCOUNTER — Telehealth: Payer: Self-pay | Admitting: Obstetrics and Gynecology

## 2016-01-04 DIAGNOSIS — N809 Endometriosis, unspecified: Secondary | ICD-10-CM

## 2016-01-04 DIAGNOSIS — N946 Dysmenorrhea, unspecified: Secondary | ICD-10-CM

## 2016-01-04 MED ORDER — LEUPROLIDE ACETATE (3 MONTH) 11.25 MG IM KIT: 11.2500 mg | PACK | INTRAMUSCULAR | 1 refills | Status: DC

## 2016-01-04 NOTE — Progress Notes (Signed)
Lupron ordered, see telephone message.

## 2016-01-04 NOTE — Telephone Encounter (Signed)
Pt had already left the number below on my voicemail. Also the number will be provided when and if prio Berkley Harvey is needed.

## 2016-01-04 NOTE — Telephone Encounter (Signed)
Patient called with the number to [facility] pharmacy to order lupron. [phone].Thanks

## 2016-01-06 ENCOUNTER — Ambulatory Visit: Payer: Self-pay

## 2016-01-07 ENCOUNTER — Ambulatory Visit: Payer: Self-pay

## 2016-01-11 ENCOUNTER — Telehealth: Payer: Self-pay | Admitting: Obstetrics and Gynecology

## 2016-01-11 NOTE — Telephone Encounter (Signed)
There pharmacy messed up her ins infro for Lupron. She called and said they have the right info now and it the pre aut has to be redone. Will you call [employer] [phone] and get her Lupron pre authorized so they can overnight it to her..... She said she was sorry

## 2016-01-11 NOTE — Telephone Encounter (Signed)
[employer], had prior auth initiated.

## 2016-01-13 ENCOUNTER — Encounter: Payer: Self-pay | Admitting: Obstetrics and Gynecology

## 2016-01-19 ENCOUNTER — Ambulatory Visit
Admission: RE | Admit: 2016-01-19 | Payer: BC Managed Care – PPO | Source: Ambulatory Visit | Admitting: Gastroenterology

## 2016-01-19 ENCOUNTER — Encounter: Admission: RE | Payer: Self-pay | Source: Ambulatory Visit

## 2016-01-19 SURGERY — COLONOSCOPY WITH PROPOFOL
Anesthesia: General

## 2016-01-21 ENCOUNTER — Other Ambulatory Visit: Payer: Self-pay

## 2016-01-21 ENCOUNTER — Encounter: Payer: BC Managed Care – PPO | Admitting: Internal Medicine

## 2016-01-21 DIAGNOSIS — N809 Endometriosis, unspecified: Secondary | ICD-10-CM

## 2016-01-21 MED ORDER — LEUPROLIDE ACETATE 3.75 MG IM KIT: 3.7500 mg | PACK | Freq: Once | INTRAMUSCULAR | 0 refills | Status: AC

## 2016-01-25 ENCOUNTER — Ambulatory Visit (INDEPENDENT_AMBULATORY_CARE_PROVIDER_SITE_OTHER): Payer: BC Managed Care – PPO | Admitting: Obstetrics and Gynecology

## 2016-01-25 VITALS — BP 125/80 | HR 87 | Wt 343.7 lb

## 2016-01-25 DIAGNOSIS — Z79899 Other long term (current) drug therapy: Secondary | ICD-10-CM

## 2016-01-25 DIAGNOSIS — N809 Endometriosis, unspecified: Secondary | ICD-10-CM | POA: Diagnosis not present

## 2016-01-25 LAB — POCT URINE PREGNANCY: Preg Test, Ur: NEGATIVE

## 2016-01-25 MED ORDER — LEUPROLIDE ACETATE 3.75 MG IM KIT
3.7500 mg | PACK | Freq: Once | INTRAMUSCULAR | Status: AC
  Administered 2016-01-25: 3.75 mg via INTRAMUSCULAR

## 2016-01-25 NOTE — Progress Notes (Signed)
Patient ID: Linda Ortiz, female   DOB: 01/27/1971, 45 y.o.   MRN: [id number] Pt presents for 1st Lupron injection. UPT: NEGATIVE. Pt has taken this in the past and is aware of side effects.

## 2016-02-08 ENCOUNTER — Other Ambulatory Visit: Payer: Self-pay | Admitting: Obstetrics and Gynecology

## 2016-02-08 DIAGNOSIS — N809 Endometriosis, unspecified: Secondary | ICD-10-CM

## 2016-02-25 ENCOUNTER — Ambulatory Visit: Payer: Self-pay

## 2016-02-29 DIAGNOSIS — Z6841 Body Mass Index (BMI) 40.0 and over, adult: Secondary | ICD-10-CM | POA: Insufficient documentation

## 2016-03-01 ENCOUNTER — Other Ambulatory Visit: Payer: Self-pay | Admitting: Internal Medicine

## 2016-03-01 DIAGNOSIS — G4733 Obstructive sleep apnea (adult) (pediatric): Secondary | ICD-10-CM | POA: Insufficient documentation

## 2016-03-01 DIAGNOSIS — Z1231 Encounter for screening mammogram for malignant neoplasm of breast: Secondary | ICD-10-CM

## 2016-03-02 ENCOUNTER — Encounter: Payer: Self-pay | Admitting: Obstetrics and Gynecology

## 2016-03-02 ENCOUNTER — Ambulatory Visit (INDEPENDENT_AMBULATORY_CARE_PROVIDER_SITE_OTHER): Payer: BC Managed Care – PPO | Admitting: Obstetrics and Gynecology

## 2016-03-02 VITALS — BP 115/78 | HR 77 | Ht 64.0 in | Wt 338.1 lb

## 2016-03-02 DIAGNOSIS — Z113 Encounter for screening for infections with a predominantly sexual mode of transmission: Secondary | ICD-10-CM

## 2016-03-02 DIAGNOSIS — E538 Deficiency of other specified B group vitamins: Secondary | ICD-10-CM

## 2016-03-02 DIAGNOSIS — N809 Endometriosis, unspecified: Secondary | ICD-10-CM

## 2016-03-02 DIAGNOSIS — Z01419 Encounter for gynecological examination (general) (routine) without abnormal findings: Secondary | ICD-10-CM

## 2016-03-02 NOTE — Progress Notes (Signed)
GYNECOLOGY ANNUAL PHYSICAL EXAM PROGRESS NOTE  Subjective:    Linda Ortiz is a 45 y.o. [place] female who presents for an annual exam. The patient has no complaints today. The patient is not currently sexually active. The patient wears seatbelts: yes. The patient participates in regular exercise: no. Has the patient ever been transfused or tattooed?: tattoos. The patient reports that there is not domestic violence in her life.    Of note, patient is also following up after first dose of Lupron (given 01/2016) for treatment of suspected endometriosis symptoms.  Has been on Lupron in the past (last year for 6 months), which helped her symptoms, has had to resume as symptoms have returned.   Gynecologic History  Menarche age: 53 LMP 09/2015.    Contraception: abstinence History of STI's: Denies Last Pap: 2015. Results were: normal.  Denies h/o abnormal pap smears. Last mammogram: 12/2009. Results were: normal   Obstetric History   G0   P0   T0   P0   A0   L0    SAB0   TAB0   Ectopic0   Multiple0   Live Births0       Past Medical History:  Diagnosis Date  . Anxiety   . Diverticulitis   . Dysmenorrhea   . Endometriosis   . Hematoma of right lower extremity   . Menorrhagia   . Morbid obesity ([place])   . Pancreatitis, acute   . PCOS (polycystic ovarian syndrome)   . Pneumonia   . Rib fracture    Left     Past Surgical History:  Procedure Laterality Date  . APPENDECTOMY  2005  . ARTHROSCOPY KNEE W/ DRILLING    . [place] SURGERY  2007  . CHOLECYSTECTOMY N/A 10/19/2015   Procedure: LAPAROSCOPIC CHOLECYSTECTOMY ;  Surgeon: Jules Husbands, MD;  Location: [facility] ORS;  Service: General;  Laterality: N/A;  . EYE SURGERY  2017    Family History  Problem Relation Age of Onset  . Arthritis Mother     rheumatoid  . Hypertension Mother   . Hyperlipidemia Father   . Mental illness Maternal Grandmother     dementia  . Alcohol abuse Maternal Grandmother   . ALS Paternal  Grandmother     Social History   Social History  . Marital status: Single    Spouse name: N/A  . Number of children: N/A  . Years of education: N/A   Occupational History  . Not on file.   Social History Main Topics  . Smoking status: Former Smoker    Packs/day: 0.25    Years: 8.00    Types: Cigarettes    Quit date: 09/12/2015  . Smokeless tobacco: Never Used  . Alcohol use No  . Drug use: No  . Sexual activity: Yes   Other Topics Concern  . Not on file   Social History Narrative  . No narrative on file    Current Outpatient Prescriptions on File Prior to Visit  Medication Sig Dispense Refill  . cetirizine (ZYRTEC) 10 MG tablet Take 10 mg by mouth daily.    . cholecalciferol (VITAMIN D) 1000 units tablet Take 1,000 Units by mouth daily.    . ciprofloxacin (CIPRO) 500 MG tablet Take 1 tablet (500 mg total) by mouth 2 (two) times daily. 20 tablet 0  . cyclobenzaprine (FLEXERIL) 10 MG tablet Take 1 tablet (10 mg total) by mouth 3 (three) times daily as needed for muscle spasms. 30 tablet 0  .  fluconazole (DIFLUCAN) 150 MG tablet Take 1 tablet (150 mg total) by mouth daily. 2 tablet 0  . Lactobacillus-Inulin ([place]) CAPS Take 1 capsule by mouth 2 (two) times daily.    Marland Kitchen lamoTRIgine (LAMICTAL) 150 MG tablet Take 1 tablet (150 mg total) by mouth at bedtime. 30 tablet 3  . LUPRON DEPOT, 48-MONTH, 3.75 MG injection RECONSTITUTE AS DIRECTED. INJECT 3.75 MG INTRAMUSCULARLY ONCE MONTHLY. 1 kit 2  . medroxyPROGESTERone (PROVERA) 5 MG tablet Take 1 tablet (5 mg total) by mouth daily. 30 tablet 11  . metroNIDAZOLE (FLAGYL) 500 MG tablet Take 1 tablet (500 mg total) by mouth 3 (three) times daily. 42 tablet 0  . ondansetron (ZOFRAN) 4 MG tablet Take 1 tablet (4 mg total) by mouth every 4 (four) hours as needed for nausea. 20 tablet 0  . oxycodone (OXY-IR) 5 MG capsule Take 5 mg by mouth every 6 (six) hours as needed.    . venlafaxine (EFFEXOR) 75 MG tablet Take 1  tablet (75 mg total) by mouth 2 (two) times daily. 60 tablet 3  . zolpidem (AMBIEN) 10 MG tablet Take 10 mg by mouth at bedtime.  3   No current facility-administered medications on file prior to visit.     No Known Allergies    Review of Systems Constitutional: positive for weight loss, intentional (~ 58 lbs since March).  Negative for chills, fatigue, fevers and sweats Eyes: negative for irritation, redness and visual disturbance Ears, nose, mouth, throat, and face: negative for hearing loss, nasal congestion, snoring and tinnitus Respiratory: negative for asthma, cough, sputum Cardiovascular: negative for chest pain, dyspnea, exertional chest pressure/discomfort, irregular heart beat, palpitations and syncope Gastrointestinal: negative for abdominal pain, change in bowel habits, nausea and vomiting Genitourinary: negative for abnormal menstrual periods, genital lesions, sexual problems and vaginal discharge, dysuria and urinary incontinence Integument/breast: negative for breast lump, breast tenderness and nipple discharge Hematologic/lymphatic: negative for bleeding and easy bruising Musculoskeletal:negative for back pain and muscle weakness Neurological: negative for dizziness, headaches, vertigo and weakness Endocrine: negative for diabetic symptoms including polydipsia, polyuria and skin dryness Allergic/Immunologic: negative for hay fever and urticaria       Objective:  Blood pressure 115/78, pulse 77, height 5' 4"  (1.626 m), weight (!) 338 lb 1.6 oz (153.4 kg). Body mass index is 58.03 kg/m.  General Appearance:    Alert, cooperative, no distress, appears stated age  Head:    Normocephalic, without obvious abnormality, atraumatic  Eyes:    PERRL, conjunctiva/corneas clear, EOM's intact, both eyes  Ears:    Normal external ear canals, both ears  Nose:   Nares normal, septum midline, mucosa normal, no drainage or sinus tenderness  Throat:   Lips, mucosa, and tongue normal;  teeth and gums normal  Neck:   Supple, symmetrical, trachea midline, no adenopathy; thyroid: no enlargement/tenderness/nodules; no carotid bruit or JVD  Back:     Symmetric, no curvature, ROM normal, no CVA tenderness  Lungs:     Clear to auscultation bilaterally, respirations unlabored  Chest Wall:    No tenderness or deformity   Heart:    Regular rate and rhythm, S1 and S2 normal, no murmur, rub or gallop  Breast Exam:    No tenderness, masses, or nipple abnormality  Abdomen:     Soft, non-tender, bowel sounds active all four quadrants, no masses, no organomegaly.    Genitalia:    Pelvic:external genitalia normal, vagina without lesions, discharge, or tenderness, rectovaginal septum  normal. Cervix normal in  appearance, no cervical motion tenderness, no adnexal masses or tenderness.  Uterus normal size, shape, mobile, regular contours, nontender.  Rectal:    Normal external sphincter.  No hemorrhoids appreciated. Internal exam not done.   Extremities:   Extremities normal, atraumatic, no cyanosis or edema  Pulses:   2+ and symmetric all extremities  Skin:   Skin color, texture, turgor normal, no rashes or lesions  Lymph nodes:   Cervical, supraclavicular, and axillary nodes normal  Neurologic:   CNII-XII intact, normal strength, sensation and reflexes throughout    Labs:  Lab Results  Component Value Date   WBC 10.6 12/10/2015   HGB 14.5 12/10/2015   HCT 42.0 12/10/2015   MCV 88.7 12/10/2015   PLT 320 12/10/2015    Lab Results  Component Value Date   CREATININE 0.87 12/10/2015   BUN 12 12/10/2015   NA 135 12/10/2015   K 4.1 12/10/2015   CL 104 12/10/2015   CO2 23 12/10/2015    Lab Results  Component Value Date   ALT 27 12/10/2015   AST 24 12/10/2015   ALKPHOS 101 12/10/2015   BILITOT 0.3 12/10/2015    Lipid Panel     Component Value Date/Time   CHOL 254 (H) 04/26/2013 0904   TRIG 129.0 04/26/2013 0904   HDL 99.20 04/26/2013 0904   CHOLHDL 3 04/26/2013 0904   VLDL  25.8 04/26/2013 0904   LDLDIRECT 139.1 04/26/2013 0904     Lab Results  Component Value Date   TSH 2.69 04/26/2013     Assessment:   Routine gynecologic exam.  Morbidly obese (Class III Obesity) Endometriosis (based on symptoms) H/o Vitamin B12 deficiency  Plan:    Blood tests: Vitamin D and Vitamin B levels for h/o Vitamin deficiency.  Patient due to have remaining labs drawn in several weeks with PCP.  Breast self exam technique reviewed and patient encouraged to perform self-exam monthly. Contraception: abstinence. STD screening (patient notes that she has never been screened). Mammogram ordered. Pap smear up to date, due in 2018.  Up to date on vaccines, flu and pneumonia vaccines received yesterday.  Lupron injection given today. RTC monthly for injections.  Return to clinic in 1 year for next annual exam.    Rubie Maid, MD [facility]

## 2016-03-03 LAB — HEP, RPR, HIV PANEL
HEP B S AG: NEGATIVE
HIV Screen 4th Generation wRfx: NONREACTIVE
RPR Ser Ql: NONREACTIVE

## 2016-03-03 LAB — VITAMIN B12

## 2016-03-03 LAB — HEPATITIS C ANTIBODY: Hep C Virus Ab: <0.1 s/co ratio (ref 0.0–0.9)

## 2016-03-03 LAB — VITAMIN D 25 HYDROXY (VIT D DEFICIENCY, FRACTURES): Vit D, 25-Hydroxy: 23.5 ng/mL — ABNORMAL LOW (ref 30.0–100.0)

## 2016-03-15 ENCOUNTER — Ambulatory Visit: Payer: BC Managed Care – PPO

## 2016-03-23 ENCOUNTER — Ambulatory Visit
Admission: RE | Admit: 2016-03-23 | Discharge: 2016-03-23 | Disposition: A | Discharge status: Home / Self Care | Payer: BC Managed Care – PPO | Source: Ambulatory Visit | Attending: Internal Medicine | Admitting: Internal Medicine

## 2016-03-23 DIAGNOSIS — Z1231 Encounter for screening mammogram for malignant neoplasm of breast: Secondary | ICD-10-CM | POA: Diagnosis not present

## 2016-03-24 ENCOUNTER — Ambulatory Visit (INDEPENDENT_AMBULATORY_CARE_PROVIDER_SITE_OTHER): Payer: BC Managed Care – PPO | Admitting: Obstetrics and Gynecology

## 2016-03-24 VITALS — BP 111/88 | HR 56 | Ht 64.0 in | Wt 338.9 lb

## 2016-03-24 DIAGNOSIS — N809 Endometriosis, unspecified: Secondary | ICD-10-CM | POA: Diagnosis not present

## 2016-03-24 MED ORDER — LEUPROLIDE ACETATE 3.75 MG IM KIT
3.7500 mg | PACK | Freq: Once | INTRAMUSCULAR | Status: AC
  Administered 2016-03-24: 3.75 mg via INTRAMUSCULAR

## 2016-03-24 NOTE — Progress Notes (Signed)
Patient ID: Linda Ortiz, female   DOB: 05/20/1971, 45 y.o.   MRN: [id number] Pt receives 3rd Lupron injection, 3.75mg . No c/o side effects.

## 2016-04-14 ENCOUNTER — Ambulatory Visit (INDEPENDENT_AMBULATORY_CARE_PROVIDER_SITE_OTHER): Payer: BC Managed Care – PPO

## 2016-04-14 DIAGNOSIS — N809 Endometriosis, unspecified: Secondary | ICD-10-CM

## 2016-04-14 NOTE — Progress Notes (Signed)
Patient ID: Linda Ortiz, female   DOB: 02/20/1971, 45 y.o.   MRN: [id number] Pt here early to receive Lupron, last received injection on 03/24/16 of which is 10 days early. I spoke with Dr. Valentino Saxonherry and she needs to wait until next week. Pt informed and injection not given. Pt had already mixed medication, ready to be given. I suggested she let her pharmacy know so she could get another one. I informed pt that injection should be done every 4 weeks. Pt to reschedule.

## 2016-05-19 ENCOUNTER — Ambulatory Visit: Payer: Self-pay

## 2016-05-20 ENCOUNTER — Ambulatory Visit: Payer: Self-pay

## 2016-05-23 ENCOUNTER — Telehealth: Payer: Self-pay | Admitting: Obstetrics and Gynecology

## 2016-05-23 ENCOUNTER — Ambulatory Visit: Payer: Self-pay

## 2016-05-23 NOTE — Telephone Encounter (Signed)
Linda OddiBetsy said she is having trouble getting her medication. Lupron. She wanted to know if you could follow up on it

## 2016-05-23 NOTE — Telephone Encounter (Signed)
Sent pt mychart message

## 2016-05-25 ENCOUNTER — Ambulatory Visit: Payer: Self-pay

## 2016-05-30 ENCOUNTER — Ambulatory Visit (INDEPENDENT_AMBULATORY_CARE_PROVIDER_SITE_OTHER): Payer: BC Managed Care – PPO | Admitting: Obstetrics and Gynecology

## 2016-05-30 VITALS — BP 130/87 | HR 105 | Ht 64.0 in | Wt 345.5 lb

## 2016-05-30 DIAGNOSIS — N809 Endometriosis, unspecified: Secondary | ICD-10-CM | POA: Diagnosis not present

## 2016-05-30 MED ORDER — LEUPROLIDE ACETATE 3.75 MG IM KIT
3.7500 mg | PACK | Freq: Once | INTRAMUSCULAR | Status: AC
  Administered 2016-05-30: 3.75 mg via INTRAMUSCULAR

## 2016-05-30 NOTE — Progress Notes (Signed)
Patient ID: Linda Ortiz, female   DOB: 01/07/1971, 45 y.o.   MRN: [id number]  Pt presents for Lupron injection for Endometriosis. No c/o side effects.

## 2016-06-02 ENCOUNTER — Encounter: Payer: Self-pay | Admitting: *Deleted

## 2016-06-03 ENCOUNTER — Ambulatory Visit
Admission: RE | Admit: 2016-06-03 | Discharge: 2016-06-03 | Disposition: A | Discharge status: Home / Self Care | Payer: BC Managed Care – PPO | Source: Ambulatory Visit | Attending: Gastroenterology | Admitting: Gastroenterology

## 2016-06-03 ENCOUNTER — Ambulatory Visit: Payer: BC Managed Care – PPO | Admitting: Anesthesiology

## 2016-06-03 ENCOUNTER — Encounter: Payer: Self-pay | Admitting: *Deleted

## 2016-06-03 ENCOUNTER — Encounter
Admission: RE | Disposition: A | Discharge status: Home / Self Care | Payer: Self-pay | Source: Ambulatory Visit | Attending: Gastroenterology

## 2016-06-03 DIAGNOSIS — K5730 Diverticulosis of large intestine without perforation or abscess without bleeding: Secondary | ICD-10-CM | POA: Diagnosis not present

## 2016-06-03 DIAGNOSIS — K2210 Ulcer of esophagus without bleeding: Secondary | ICD-10-CM | POA: Diagnosis not present

## 2016-06-03 DIAGNOSIS — Z9884 Bariatric surgery status: Secondary | ICD-10-CM | POA: Insufficient documentation

## 2016-06-03 DIAGNOSIS — F419 Anxiety disorder, unspecified: Secondary | ICD-10-CM | POA: Diagnosis not present

## 2016-06-03 DIAGNOSIS — Z885 Allergy status to narcotic agent status: Secondary | ICD-10-CM | POA: Insufficient documentation

## 2016-06-03 DIAGNOSIS — E282 Polycystic ovarian syndrome: Secondary | ICD-10-CM | POA: Diagnosis not present

## 2016-06-03 DIAGNOSIS — K210 Gastro-esophageal reflux disease with esophagitis: Secondary | ICD-10-CM | POA: Diagnosis not present

## 2016-06-03 DIAGNOSIS — F329 Major depressive disorder, single episode, unspecified: Secondary | ICD-10-CM | POA: Insufficient documentation

## 2016-06-03 DIAGNOSIS — Z87891 Personal history of nicotine dependence: Secondary | ICD-10-CM | POA: Insufficient documentation

## 2016-06-03 DIAGNOSIS — R1032 Left lower quadrant pain: Secondary | ICD-10-CM | POA: Diagnosis present

## 2016-06-03 DIAGNOSIS — Z6841 Body Mass Index (BMI) 40.0 and over, adult: Secondary | ICD-10-CM | POA: Insufficient documentation

## 2016-06-03 HISTORY — DX: Adverse effect of unspecified anesthetic, initial encounter: T41.45XA

## 2016-06-03 HISTORY — DX: Other specified postprocedural states: R11.2

## 2016-06-03 HISTORY — DX: Other complications of anesthesia, initial encounter: T88.59XA

## 2016-06-03 HISTORY — PX: COLONOSCOPY WITH PROPOFOL: SHX5780

## 2016-06-03 HISTORY — DX: Other specified postprocedural states: Z98.890

## 2016-06-03 HISTORY — PX: ESOPHAGOGASTRODUODENOSCOPY (EGD) WITH PROPOFOL: SHX5813

## 2016-06-03 LAB — POCT PREGNANCY, URINE: Preg Test, Ur: NEGATIVE

## 2016-06-03 SURGERY — COLONOSCOPY WITH PROPOFOL
Anesthesia: General

## 2016-06-03 MED ORDER — PROPOFOL 500 MG/50ML IV EMUL
INTRAVENOUS | Status: AC
  Filled 2016-06-03: qty 50

## 2016-06-03 MED ORDER — PROPOFOL 10 MG/ML IV BOLUS
INTRAVENOUS | Status: DC | PRN
  Administered 2016-06-03 (×2): 30 mg via INTRAVENOUS
  Administered 2016-06-03: 20 mg via INTRAVENOUS

## 2016-06-03 MED ORDER — PHENYLEPHRINE HCL 10 MG/ML IJ SOLN
INTRAMUSCULAR | Status: DC | PRN
  Administered 2016-06-03: 80 ug via INTRAVENOUS

## 2016-06-03 MED ORDER — PROPOFOL 500 MG/50ML IV EMUL
INTRAVENOUS | Status: DC | PRN
  Administered 2016-06-03: 120 ug/kg/min via INTRAVENOUS

## 2016-06-03 MED ORDER — SODIUM CHLORIDE 0.9 % IV SOLN: INTRAVENOUS | Status: DC

## 2016-06-03 MED ORDER — MIDAZOLAM HCL 2 MG/2ML IJ SOLN
INTRAMUSCULAR | Status: AC
  Filled 2016-06-03: qty 2

## 2016-06-03 MED ORDER — GLYCOPYRROLATE 0.2 MG/ML IJ SOLN
INTRAMUSCULAR | Status: DC | PRN
  Administered 2016-06-03: 0.2 mg via INTRAVENOUS

## 2016-06-03 MED ORDER — SODIUM CHLORIDE 0.9 % IV SOLN
INTRAVENOUS | Status: DC
  Administered 2016-06-03: 10:00:00 via INTRAVENOUS

## 2016-06-03 MED ORDER — MIDAZOLAM HCL 2 MG/2ML IJ SOLN
INTRAMUSCULAR | Status: DC | PRN
  Administered 2016-06-03 (×2): 1 mg via INTRAVENOUS

## 2016-06-03 NOTE — Op Note (Signed)
[facility] Gastroenterology Patient Name: [name] Procedure Date: 06/03/2016 9:54 AM MRN: [id number] Account #: [id number] Date of Birth: 08/12/1970 Admit Type: Outpatient Age: [age] [facility] ROOM 3 Gender: Female Note Status: Finalized Procedure:            Colonoscopy Indications:          Abdominal pain in the left lower quadrant, Diarrhea,                        Follow-up of diverticulitis Providers:            Christena DeemMartin U. Ernestyne Caldwell, MD Referring MD:         Daniel NonesBert Klein, MD (Referring MD) Medicines:            Monitored Anesthesia Care Complications:        No immediate complications. Procedure:            Pre-Anesthesia Assessment:                       - ASA Grade Assessment: III - A patient with severe                        systemic disease.                       After obtaining informed consent, the colonoscope was                        passed under direct vision. Throughout the procedure,                        the patient's blood pressure, pulse, and oxygen                        saturations were monitored continuously. The                        Colonoscope was introduced through the anus and                        advanced to the the cecum, identified by appendiceal                        orifice and ileocecal valve. The colonoscopy was                        performed without difficulty. The patient tolerated the                        procedure well. The quality of the bowel preparation                        was good. Findings:      A few small-mouthed diverticula were found in the sigmoid colon.      The retroflexed view of the distal rectum and anal verge was normal and       showed no anal or rectal abnormalities.      Biopsies for histology were taken with a cold forceps from the right       colon and left colon for evaluation of microscopic colitis.  The exam was otherwise normal throughout the examined colon.      The digital  rectal exam was normal. Impression:           - Diverticulosis in the sigmoid colon.                       - The distal rectum and anal verge are normal on                        retroflexion view.                       - Biopsies were taken with a cold forceps from the                        right colon and left colon for evaluation of                        microscopic colitis. Recommendation:       - Discharge patient to home. Procedure Code(s):    --- Professional ---                       [id number], Colonoscopy, flexible; with biopsy, single or                        multiple Diagnosis Code(s):    --- Professional ---                       R10.32, Left lower quadrant pain                       R19.7, Diarrhea, unspecified                       K57.32, Diverticulitis of large intestine without                        perforation or abscess without bleeding                       K57.30, Diverticulosis of large intestine without                        perforation or abscess without bleeding CPT copyright 2016 American [employer] Association. All rights reserved. The codes documented in this report are preliminary and upon coder review may  be revised to meet current compliance requirements. Christena DeemMartin U Renley Banwart, MD 06/03/2016 10:50:34 AM This report has been signed electronically. Number of Addenda: 0 Note Initiated On: 06/03/2016 9:54 AM Scope Withdrawal Time: 0 hours 11 minutes 18 seconds  Total Procedure Duration: 0 hours 16 minutes 27 seconds       [facility]

## 2016-06-03 NOTE — Transfer of Care (Signed)
Immediate Anesthesia Transfer of Care Note  Patient: Linda Ortiz  Procedure(s) Performed: Procedure(s): COLONOSCOPY WITH PROPOFOL (N/A) ESOPHAGOGASTRODUODENOSCOPY (EGD) WITH PROPOFOL (N/A)  Patient Location: PACU  Anesthesia Type:General  Level of Consciousness: awake, alert  and oriented  Airway & Oxygen Therapy: Patient Spontanous Breathing and Patient connected to nasal cannula oxygen  Post-op Assessment: Report given to RN and Post -op Vital signs reviewed and stable  Post vital signs: Reviewed and stable  Last Vitals:  Vitals:   06/03/16 0923 06/03/16 1054  BP: (!) 153/101   Pulse: 88 81  Resp: 14 (!) 21  Temp: 36.6 C (!) 35.7 C    Last Pain:  Vitals:   06/03/16 0923  TempSrc: Tympanic         Complications: No apparent anesthesia complications

## 2016-06-03 NOTE — Anesthesia Preprocedure Evaluation (Signed)
Anesthesia Evaluation  Patient identified by MRN, date of birth, ID band Patient awake    Reviewed: Allergy & Precautions, H&P , NPO status , Patient's Chart, lab work & pertinent test results  History of Anesthesia Complications (+) PONV, DIFFICULT IV STICK / SPECIAL LINE and history of anesthetic complications  Airway Mallampati: II  TM Distance: >3 FB Neck ROM: full    Dental  (+) Poor Dentition, Chipped   Pulmonary sleep apnea , pneumonia, resolved, former smoker,    Pulmonary exam normal breath sounds clear to auscultation       Cardiovascular (-) angina(-) Past MI and (-) DOE negative cardio ROS Normal cardiovascular exam Rhythm:regular Rate:Normal     Neuro/Psych PSYCHIATRIC DISORDERS Anxiety Depression negative neurological ROS     GI/Hepatic negative GI ROS, Neg liver ROS, neg GERD  ,  Endo/Other  negative endocrine ROS  Renal/GU Renal disease  negative genitourinary   Musculoskeletal   Abdominal   Peds  Hematology negative hematology ROS (+)   Anesthesia Other Findings Past Medical History: No date: Anxiety No date: Complication of anesthesia No date: Diverticulitis No date: Dysmenorrhea No date: Endometriosis No date: Hematoma of right lower extremity No date: Menorrhagia No date: Morbid obesity (HCC) No date: Pancreatitis, acute No date: PCOS (polycystic ovarian syndrome) No date: Pneumonia No date: PONV (postoperative nausea and vomiting)     Comment: General Anesthesia No date: Rib fracture     Comment: Left   Past Surgical History: 2005: APPENDECTOMY No date: ARTHROSCOPY KNEE W/ DRILLING 2007: BARIATRIC SURGERY 10/19/2015: CHOLECYSTECTOMY N/A     Comment: Procedure: LAPAROSCOPIC CHOLECYSTECTOMY ;                Surgeon: Leafy Roiego F Pabon, MD;  Location: [facility]               ORS;  Service: General;  Laterality: N/A; 2017: EYE SURGERY  BMI    Body Mass Index:  59.22 kg/m      Reproductive/Obstetrics negative OB ROS                             Anesthesia Physical Anesthesia Plan  ASA: III  Anesthesia Plan: General   Post-op Pain Management:    Induction:   Airway Management Planned:   Additional Equipment:   Intra-op Plan:   Post-operative Plan:   Informed Consent: I have reviewed the patients History and Physical, chart, labs and discussed the procedure including the risks, benefits and alternatives for the proposed anesthesia with the patient or authorized representative who has indicated his/her understanding and acceptance.   Dental Advisory Given  Plan Discussed with: Anesthesiologist, CRNA and Surgeon  Anesthesia Plan Comments:         Anesthesia Quick Evaluation

## 2016-06-03 NOTE — Anesthesia Postprocedure Evaluation (Signed)
Anesthesia Post Note  Patient: Graylin Shiverlizabeth J Huseby  Procedure(s) Performed: Procedure(s) (LRB): COLONOSCOPY WITH PROPOFOL (N/A) ESOPHAGOGASTRODUODENOSCOPY (EGD) WITH PROPOFOL (N/A)  Patient location during evaluation: Endoscopy Anesthesia Type: General Level of consciousness: awake and alert Pain management: pain level controlled Vital Signs Assessment: post-procedure vital signs reviewed and stable Respiratory status: spontaneous breathing, nonlabored ventilation, respiratory function stable and patient connected to nasal cannula oxygen Cardiovascular status: blood pressure returned to baseline and stable Postop Assessment: no signs of nausea or vomiting Anesthetic complications: no     Last Vitals:  Vitals:   06/03/16 1114 06/03/16 1124  BP: (!) 125/97 (!) 146/90  Pulse: 80 70  Resp: 20 16  Temp:  (!) 36 C    Last Pain:  Vitals:   06/03/16 1124  TempSrc: Tympanic                 Cleda MccreedyJoseph K Kyera Felan

## 2016-06-03 NOTE — H&P (Signed)
Outpatient short stay form Pre-procedure 06/03/2016 9:35 AM Linda Sails MD  Primary Physician: Dr. Ramonita Lab  Reason for visit:  EGD and colonoscopy  History of present illness:  Patient is a 45 year old female presenting today as above. She has a history of a gastric bypass remote, probably over 10 years ago, but has had more recent issues with nausea and vomiting. Has been some associated epigastric pain with this as well. She does not take any medications in this regard. She has never had a colonoscopy. She did apparently have a bout of diverticular colitis with perforation at that time. This responded to conservative treatment, without surgery. Sister has ulcerative colitis. She has had a cholecystectomy several months ago. She does not currently take proton pump inhibitor. She tolerated her prep well. She takes no blood thinning agents or aspirin products.    Current Facility-Administered Medications:  .  0.9 %  sodium chloride infusion, , Intravenous, Continuous, Linda Sails, MD .  0.9 %  sodium chloride infusion, , Intravenous, Continuous, Linda Sails, MD  Prescriptions Prior to Admission  Medication Sig Dispense Refill Last Dose  . cetirizine (ZYRTEC) 10 MG tablet Take 10 mg by mouth daily.   Past Week at Unknown time  . cholecalciferol (VITAMIN D) 1000 units tablet Take 1,000 Units by mouth daily.   Past Week at Unknown time  . Cyanocobalamin 1500 MCG TBDP Take by mouth.   Past Week at Unknown time  . Lactobacillus Rhamnosus, GG, (CULTURELLE PO) Take by mouth.   Past Week at Unknown time  . Lactobacillus-Inulin ([place]) CAPS Take 1 capsule by mouth 2 (two) times daily.   Past Week at Unknown time  . lamoTRIgine (LAMICTAL) 200 MG tablet    Past Week at Unknown time  . sertraline (ZOLOFT) 100 MG tablet    Past Week at Unknown time  . zolpidem (AMBIEN) 10 MG tablet Take 10 mg by mouth at bedtime.  3 Past Week at Unknown time  . ciprofloxacin  (CIPRO) 500 MG tablet Take 1 tablet (500 mg total) by mouth 2 (two) times daily. 20 tablet 0 Taking  . cyclobenzaprine (FLEXERIL) 10 MG tablet Take 1 tablet (10 mg total) by mouth 3 (three) times daily as needed for muscle spasms. 30 tablet 0 Taking  . fluconazole (DIFLUCAN) 150 MG tablet Take 1 tablet (150 mg total) by mouth daily. 2 tablet 0 Taking  . LUPRON DEPOT, 35-MONTH, 3.75 MG injection RECONSTITUTE AS DIRECTED. INJECT 3.75 MG INTRAMUSCULARLY ONCE MONTHLY. 1 kit 2 Taking  . medroxyPROGESTERone (PROVERA) 5 MG tablet Take 1 tablet (5 mg total) by mouth daily. 30 tablet 11 Taking  . metroNIDAZOLE (FLAGYL) 500 MG tablet Take 1 tablet (500 mg total) by mouth 3 (three) times daily. 42 tablet 0 Taking  . ondansetron (ZOFRAN) 4 MG tablet Take 1 tablet (4 mg total) by mouth every 4 (four) hours as needed for nausea. 20 tablet 0 Taking  . oxycodone (OXY-IR) 5 MG capsule Take 5 mg by mouth every 6 (six) hours as needed.   Taking  . venlafaxine (EFFEXOR) 75 MG tablet Take 1 tablet (75 mg total) by mouth 2 (two) times daily. 60 tablet 3 Taking     Allergies  Allergen Reactions  . Morphine And Related Itching     Past Medical History:  Diagnosis Date  . Anxiety   . Complication of anesthesia   . Diverticulitis   . Dysmenorrhea   . Endometriosis   . Hematoma of right  lower extremity   . Menorrhagia   . Morbid obesity ([place])   . Pancreatitis, acute   . PCOS (polycystic ovarian syndrome)   . Pneumonia   . PONV (postoperative nausea and vomiting)    General Anesthesia  . Rib fracture    Left     Review of systems:      Physical Exam    Heart and lungs: Regular rate and rhythm without rub or gallop, lungs are bilaterally clear.    HEENT: Normocephalic atraumatic eyes are anicteric    Other:     Pertinant exam for procedure: Soft mild tender to palpation left lower quadrant. Morbidly obese, unable to palpate internal organs. No rebound noted.    Planned proceedures: EGD and  colonoscopy with indicated procedures. I have discussed the risks benefits and complications of procedures to include not limited to bleeding, infection, perforation and the risk of sedation and the patient wishes to proceed.    Linda Sails, MD Gastroenterology 06/03/2016  9:35 AM

## 2016-06-07 LAB — SURGICAL PATHOLOGY

## 2016-06-08 ENCOUNTER — Encounter: Payer: Self-pay | Admitting: Gastroenterology

## 2016-06-16 ENCOUNTER — Ambulatory Visit: Payer: Self-pay

## 2016-07-01 NOTE — Op Note (Signed)
[facility] Gastroenterology Patient Name: Linda Ortiz Procedure Date: 06/03/2016 9:54 AM MRN: [id number] Account #: [id number] Date of Birth: 07/12/1970 Admit Type: Outpatient Age: 46 Room: [facility] ENDO ROOM 3 Gender: Female Note Status: Finalized Procedure:            Upper GI endoscopy Indications:          Epigastric abdominal pain Providers:            Christena Deem, MD Referring MD:         Daniel Nones, MD (Referring MD) Medicines:            Monitored Anesthesia Care Complications:        No immediate complications. Procedure:            Pre-Anesthesia Assessment:                       - ASA Grade Assessment: III - A patient with severe                        systemic disease.                       After obtaining informed consent, the endoscope was                        passed under direct vision. Throughout the procedure,                        the patient's blood pressure, pulse, and oxygen                        saturations were monitored continuously. The Endoscope                        was introduced through the mouth, and advanced to the                        afferent and efferent jejunal loops. The upper GI                        endoscopy was accomplished without difficulty. The                        patient tolerated the procedure well. Findings:      LA Grade C (one or more mucosal breaks continuous between tops of 2 or       more mucosal folds, less than 75% circumference) esophagitis with no       bleeding was found. Biopsies were taken with a cold forceps for       histology.      The exam of the esophagus was otherwise normal.      Evidence of a Roux-en-Y gastrojejunostomy was found. The gastrojejunal       anastomosis was characterized by healthy appearing mucosa. The efferent       limb was intubated for short distance, the proximal end of the limb loop       at the anastomosis was short and normal in appearance. 6 cm gastric    pouch. Impression:           - LA Grade C erosive esophagitis. Biopsied.                       -  Roux-en-Y gastrojejunostomy with gastrojejunal                        anastomosis characterized by healthy appearing mucosa. Recommendation:       - Use Aciphex (rabeprazole) 20 mg PO daily daily.                       - Return to GI clinic in 5 weeks. Procedure Code(s):    --- Professional ---                       [phone], Esophagogastroduodenoscopy, flexible, transoral;                        with biopsy, single or multiple Diagnosis Code(s):    --- Professional ---                       K20.8, Other esophagitis                       Z98.0, Intestinal bypass and anastomosis status                       R10.13, Epigastric pain CPT copyright 2016 American Medical Association. All rights reserved. The codes documented in this report are preliminary and upon coder review may  be revised to meet current compliance requirements. Christena DeemMartin U Skulskie, MD 07/01/2016 5:26:39 PM This report has been signed electronically. Number of Addenda: 0 Note Initiated On: 06/03/2016 9:54 AM      [facility]

## 2016-08-15 ENCOUNTER — Ambulatory Visit: Admit: 2016-08-15 | Payer: BC Managed Care – PPO | Admitting: Gastroenterology

## 2016-08-15 SURGERY — EGD (ESOPHAGOGASTRODUODENOSCOPY)
Anesthesia: General

## 2016-08-18 ENCOUNTER — Encounter: Payer: Self-pay | Admitting: Obstetrics and Gynecology

## 2017-02-16 ENCOUNTER — Emergency Department: Payer: BC Managed Care – PPO

## 2017-02-16 ENCOUNTER — Encounter: Payer: Self-pay | Admitting: Emergency Medicine

## 2017-02-16 ENCOUNTER — Emergency Department
Admission: EM | Admit: 2017-02-16 | Discharge: 2017-02-16 | Disposition: A | Discharge status: Home / Self Care | Payer: BC Managed Care – PPO | Attending: Emergency Medicine | Admitting: Emergency Medicine

## 2017-02-16 DIAGNOSIS — Y939 Activity, unspecified: Secondary | ICD-10-CM | POA: Insufficient documentation

## 2017-02-16 DIAGNOSIS — R600 Localized edema: Secondary | ICD-10-CM

## 2017-02-16 DIAGNOSIS — Z87891 Personal history of nicotine dependence: Secondary | ICD-10-CM | POA: Diagnosis not present

## 2017-02-16 DIAGNOSIS — W2203XA Walked into furniture, initial encounter: Secondary | ICD-10-CM | POA: Diagnosis not present

## 2017-02-16 DIAGNOSIS — Y999 Unspecified external cause status: Secondary | ICD-10-CM | POA: Diagnosis not present

## 2017-02-16 DIAGNOSIS — S8012XA Contusion of left lower leg, initial encounter: Secondary | ICD-10-CM | POA: Diagnosis not present

## 2017-02-16 DIAGNOSIS — I872 Venous insufficiency (chronic) (peripheral): Secondary | ICD-10-CM

## 2017-02-16 DIAGNOSIS — Y929 Unspecified place or not applicable: Secondary | ICD-10-CM | POA: Diagnosis not present

## 2017-02-16 DIAGNOSIS — Z79899 Other long term (current) drug therapy: Secondary | ICD-10-CM | POA: Insufficient documentation

## 2017-02-16 DIAGNOSIS — M79605 Pain in left leg: Secondary | ICD-10-CM | POA: Diagnosis present

## 2017-02-16 MED ORDER — NAPROXEN 500 MG PO TABS: 500.0000 mg | ORAL_TABLET | Freq: Two times a day (BID) | ORAL | 0 refills | Status: DC

## 2017-02-16 MED ORDER — CEPHALEXIN 500 MG PO CAPS
500.0000 mg | ORAL_CAPSULE | Freq: Once | ORAL | Status: AC
  Administered 2017-02-16: 500 mg via ORAL
  Filled 2017-02-16: qty 1

## 2017-02-16 MED ORDER — ACETAMINOPHEN 500 MG PO TABS
1000.0000 mg | ORAL_TABLET | Freq: Once | ORAL | Status: AC
  Administered 2017-02-16: 1000 mg via ORAL
  Filled 2017-02-16: qty 2

## 2017-02-16 MED ORDER — CEPHALEXIN 500 MG PO CAPS: 500.0000 mg | ORAL_CAPSULE | Freq: Four times a day (QID) | ORAL | 0 refills | Status: AC

## 2017-02-16 MED ORDER — FLUCONAZOLE 150 MG PO TABS: 150.0000 mg | ORAL_TABLET | Freq: Once | ORAL | 0 refills | Status: AC

## 2017-02-16 NOTE — Discharge Instructions (Signed)
Please review material about compartment syndrome. Return to the emergency department for any increasing pain, worsening symptoms or changes in health. Wear compression stocking during the day, keep the lower extremity elevated. you may ice 20 minutes every hour. Take cephalexin and naproxen as prescribed. Follow-up with Dr. Graciela HusbandsKlein if no improvement in 5 days.

## 2017-02-16 NOTE — ED Notes (Signed)

## 2017-02-16 NOTE — ED Provider Notes (Signed)
[facility]-EMERGENCY DEPARTMENT Provider Note   CSN: [id number] Arrival date & time: 02/16/17  1759     History   Chief Complaint Chief Complaint  Patient presents with  . Leg Pain    HPI Linda Ortiz is a 46 y.o. female presents to the for evaluation of left leg pain and swelling. Patient states last Friday she was getting into her high bed when she scraped her left leg against the rail, developed some mild pain but no significant swelling or edema. She had mild bruising. Patient states over the last 24-36 hours she's had increasing pain, swelling as well as edema in the left lower leg from the mid tibia down into the ankle. She's also had some increasing ecchymosis. Pain is increased to a 4 out of 10. Pain is present with sitting as well as standing. She has taken Tylenol for pain. She denies any history of blood clots, estrogen therapy, recent surgeries, history of cancer, long car rides. She does smoke. She denies any coughing chest pain or shortness of breath. HPI  Past Medical History:  Diagnosis Date  . Anxiety   . Complication of anesthesia   . Diverticulitis   . Dysmenorrhea   . Endometriosis   . Hematoma of right lower extremity   . Menorrhagia   . Morbid obesity (HCC)   . Pancreatitis, acute   . PCOS (polycystic ovarian syndrome)   . Pneumonia   . PONV (postoperative nausea and vomiting)    General Anesthesia  . Rib fracture    Left     Patient Active Problem List   Diagnosis Date Noted  . OSA (obstructive sleep apnea) 03/01/2016  . BMI 60.0-69.9, adult (HCC) 02/29/2016  . Acute gallstone pancreatitis   . Depression 10/14/2015  . Pancreatitis, acute 10/14/2015  . Gallstones 10/14/2015  . AKI (acute kidney injury) (HCC) 10/14/2015  . Diverticulitis large intestine 08/25/2015  . Diverticulitis of large intestine with perforation without bleeding   . Morbid obesity with BMI of 70 and over, adult (HCC) 12/05/2014  . Menorrhagia 12/05/2014  . Dysmenorrhea  12/05/2014  . PCOS (polycystic ovarian syndrome) 12/05/2014  . Endometriosis 12/05/2014  . 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 Routine general medical examination at a health care facility 05/05/2013  . Elevated blood pressure reading without diagnosis of hypertension 05/05/2013  . Pain of both elbows 05/05/2013  . Cyst of joint of ankle or foot 03/23/2013  . Vitamin D deficiency 07/11/2012  . Status post gastric bypass for obesity 07/11/2012  . Generalized anxiety disorder     Past Surgical History:  Procedure Laterality Date  . APPENDECTOMY  2005  . ARTHROSCOPY KNEE W/ DRILLING    . BARIATRIC SURGERY  2007  . CHOLECYSTECTOMY N/A 10/19/2015   Procedure: LAPAROSCOPIC CHOLECYSTECTOMY ;  Surgeon: Leafy Ro, MD;  Location: [facility] ORS;  Service: General;  Laterality: N/A;  . COLONOSCOPY WITH PROPOFOL N/A 06/03/2016   Procedure: COLONOSCOPY WITH PROPOFOL;  Surgeon: Christena Deem, MD;  Location: [facility] ENDOSCOPY;  Service: Endoscopy;  Laterality: N/A;  . ESOPHAGOGASTRODUODENOSCOPY (EGD) WITH PROPOFOL N/A 06/03/2016   Procedure: ESOPHAGOGASTRODUODENOSCOPY (EGD) WITH PROPOFOL;  Surgeon: Christena Deem, MD;  Location: [facility] ENDOSCOPY;  Service: Endoscopy;  Laterality: N/A;  . EYE SURGERY  2017    OB History    Gravida Para Term Preterm AB Living   0 0 0 0 0 0   SAB TAB Ectopic Multiple Live Births   0 0 0 0  Home Medications    Prior to Admission medications   Medication Sig Start Date End Date Taking? Authorizing Provider  cephALEXin (KEFLEX) 500 MG capsule Take 1 capsule (500 mg total) by mouth 4 (four) times daily. X 7 days 02/16/17 02/26/17  Evon SlackGaines, Darnise Montag C, PA-C  cetirizine (ZYRTEC) 10 MG tablet Take 10 mg by mouth daily.    [provider]  cholecalciferol (VITAMIN D) 1000 units tablet Take 1,000 Units by mouth daily.    [provider]  ciprofloxacin (CIPRO) 500 MG tablet Take 1 tablet (500 mg total) by mouth 2  (two) times daily. 12/09/15   Ricarda FrameWoodham, Charles, MD  Cyanocobalamin 1500 MCG TBDP Take by mouth.    [provider]  cyclobenzaprine (FLEXERIL) 10 MG tablet Take 1 tablet (10 mg total) by mouth 3 (three) times daily as needed for muscle spasms. 12/11/15   Gladis RiffleLoflin, Catherine L, MD  fluconazole (DIFLUCAN) 150 MG tablet Take 1 tablet (150 mg total) by mouth daily. 12/09/15   Ricarda FrameWoodham, Charles, MD  fluconazole (DIFLUCAN) 150 MG tablet Take 1 tablet (150 mg total) by mouth once. After completion of antibiotics 02/16/17 02/16/17  Evon SlackGaines, Aryella Besecker C, PA-C  Lactobacillus Rhamnosus, GG, (CULTURELLE PO) Take by mouth.    [provider]  Lactobacillus-Inulin (CULTURELLE DIGESTIVE HEALTH) CAPS Take 1 capsule by mouth 2 (two) times daily.    [provider]  lamoTRIgine (LAMICTAL) 200 MG tablet  01/12/16   [provider]  LUPRON DEPOT, 60-MONTH, 3.75 MG injection RECONSTITUTE AS DIRECTED. INJECT 3.75 MG INTRAMUSCULARLY ONCE MONTHLY. 02/08/16   Hildred Laserherry, Anika, MD  medroxyPROGESTERone (PROVERA) 5 MG tablet Take 1 tablet (5 mg total) by mouth daily. 12/01/15   Hildred Laserherry, Anika, MD  metroNIDAZOLE (FLAGYL) 500 MG tablet Take 1 tablet (500 mg total) by mouth 3 (three) times daily. 12/09/15   Ricarda FrameWoodham, Charles, MD  naproxen (NAPROSYN) 500 MG tablet Take 1 tablet (500 mg total) by mouth 2 (two) times daily with a meal. 02/16/17   Evon SlackGaines, Fabienne Nolasco C, PA-C  ondansetron (ZOFRAN) 4 MG tablet Take 1 tablet (4 mg total) by mouth every 4 (four) hours as needed for nausea. 10/20/15   Pabon, Diego F, MD  oxycodone (OXY-IR) 5 MG capsule Take 5 mg by mouth every 6 (six) hours as needed.    [provider]  sertraline (ZOLOFT) 100 MG tablet  01/29/16   [provider]  venlafaxine (EFFEXOR) 75 MG tablet Take 1 tablet (75 mg total) by mouth 2 (two) times daily. 12/08/15   Hildred Laserherry, Anika, MD  zolpidem (AMBIEN) 10 MG tablet Take 10 mg by mouth at bedtime. 11/05/15   [provider]    Family  History Family History  Problem Relation Age of Onset  . Arthritis Mother        rheumatoid  . Hypertension Mother   . Hyperlipidemia Father   . Mental illness Maternal Grandmother        dementia  . Alcohol abuse Maternal Grandmother   . ALS Paternal Grandmother     Social History Social History  Substance Use Topics  . Smoking status: Former Smoker    Packs/day: 0.25    Years: 8.00    Types: Cigarettes    Quit date: 09/12/2015  . Smokeless tobacco: Never Used  . Alcohol use No     Allergies   Morphine and related   Review of Systems Review of Systems  Constitutional: Negative for chills and fever.  Musculoskeletal: Positive for gait problem and myalgias. Negative for  arthralgias, joint swelling and neck pain.  Skin: Positive for rash and wound. Negative for color change.  Neurological: Negative for weakness and numbness.     Physical Exam Updated Vital Signs BP (!) 142/98   Pulse 81   Temp 99.2 F (37.3 C) (Oral)   Resp 17   Ht  (1.6 m)   Wt (!) 156.5 kg (345 lb)   SpO2 99%   BMI 61.11 kg/m   Physical Exam  Constitutional: She is oriented to person, place, and time. She appears well-developed and well-nourished.  HENT:  Head: Normocephalic and atraumatic.  Right Ear: External ear normal.  Left Ear: External ear normal.  Eyes: Pupils are equal, round, and reactive to light. EOM are normal.  Neck: Normal range of motion.  Cardiovascular: Normal rate and regular rhythm.   Pulmonary/Chest: Effort normal. No respiratory distress.  Musculoskeletal:  Examination of the left lower extremity shows patient has mild swelling, edema and ecchymosis throughout the left calf and ankle. Left calf measures 57 cm, right measures 52 cm circumference. Pitting edema is present along the ankle to the distal third of the tibia. Ecchymosis and swelling is present throughout the calf, ecchymosis is present and is on the anterior medial aspect of the shin, there is mild  surrounding erythema but no significant warmth. There is no fluctuance or drainage. No open wounds. She has good sensation distally, 2+ pedis pulse. She has no pain with ankle plantarflexion and dorsiflexion. Mildly tender along the medial and lateral malleolus but no significant pain with inversion or eversion. Knee shows no signs of effusion, warmth or redness. She is able straight leg raise.  Neurological: She is alert and oriented to person, place, and time.  Skin: Skin is warm.     ED Treatments / Results  Labs (all labs ordered are listed, but only abnormal results are displayed) Labs Reviewed - No data to display  EKG  EKG Interpretation None       Radiology Dg Tibia/fibula Left  Result Date: 02/16/2017 CLINICAL DATA:  Fall while trying to get into bed. Bruising and edema along the left calf. EXAM: LEFT TIBIA AND FIBULA - 2 VIEW COMPARISON:  None. FINDINGS: Subcutaneous edema is suspected. Mild knee osteoarthritis noted with articular space thinning and marginal spurring in the medial compartment. Midfoot degenerative arthropathy noted. Mild spurring along the anterior margin of the talar dome and anteriorly along the distal tibia. No fracture is present. IMPRESSION: 1. Subcutaneous edema in the calf. 2. Mild osteoarthritis in the knee and midfoot. Electronically Signed   By: Gaylyn Rong M.D.   On: 02/16/2017 20:44   US Venous Img Lower Unilateral Left  Result Date: 02/16/2017 CLINICAL DATA:  Left lower extremity swelling and pain for 2 days. EXAM: LEFT LOWER EXTREMITY VENOUS DOPPLER ULTRASOUND TECHNIQUE: Gray-scale sonography with graded compression, as well as color Doppler and duplex ultrasound were performed to evaluate the lower extremity deep venous systems from the level of the common femoral vein and including the common femoral, femoral, profunda femoral, popliteal and calf veins including the posterior tibial, peroneal and gastrocnemius veins when visible. The  superficial great saphenous vein was also interrogated. Spectral Doppler was utilized to evaluate flow at rest and with distal augmentation maneuvers in the common femoral, femoral and popliteal veins. COMPARISON:  None. FINDINGS: Contralateral Common Femoral Vein: Respiratory phasicity is normal and symmetric with the symptomatic side. No evidence of thrombus. Normal compressibility. Common Femoral Vein: No evidence of thrombus. Normal compressibility, respiratory phasicity  and response to augmentation. Saphenofemoral Junction: No evidence of thrombus. Normal compressibility and flow on color Doppler imaging. Profunda Femoral Vein: No evidence of thrombus. Normal compressibility and flow on color Doppler imaging. Femoral Vein: No evidence of thrombus. Normal compressibility, respiratory phasicity and response to augmentation. Popliteal Vein: No evidence of thrombus. Normal compressibility, respiratory phasicity and response to augmentation. Calf Veins: No evidence of thrombus, normal compressibility and flow on color Doppler imaging in the posterior tibial vein. Limited visualization of the perineal vein. Superficial Great Saphenous Vein: No evidence of thrombus. Normal compressibility and flow on color Doppler imaging. Venous Reflux:  None. Other Findings:  None. IMPRESSION: No evidence of DVT within the left lower extremity. Electronically Signed   By: Sherian Rein M.D.   On: 02/16/2017 21:17    Procedures Procedures (including critical care time)  Medications Ordered in ED Medications  cephALEXin (KEFLEX) capsule 500 mg (not administered)  acetaminophen (TYLENOL) tablet 1,000 mg (1,000 mg Oral Given 02/16/17 2016)     Initial Impression / Assessment and Plan / ED Course  I have reviewed the triage vital signs and the nursing notes.  Pertinent labs & imaging results that were available during my care of the patient were reviewed by me and considered in my medical decision making (see chart for  details).     46 year old female with left lower leg swelling, ecchymosis. X-rays of the tib-fib showed no evidence of acute bony abnormality with good with legs get current. Ultrasound of lower extremity is negative for DVT. Patient with some concern for venous stasis dermatitis of the left lower extremity. We'll place on antibiotic, Keflex. Patient was also given compression stockings to wear during the day and remove at night time. At patient's request, fluconazole prescription was given. She is educated on compartment syndrome, signs and symptoms return to the ED for. She'll follow up with PCP if no improvement in 5 days.     Final Clinical Impressions(s) / ED Diagnoses   Final diagnoses:  Leg edema, left  Traumatic ecchymosis of left lower leg, initial encounter  Venous stasis dermatitis of left lower extremity    New Prescriptions New Prescriptions   CEPHALEXIN (KEFLEX) 500 MG CAPSULE    Take 1 capsule (500 mg total) by mouth 4 (four) times daily. X 7 days   FLUCONAZOLE (DIFLUCAN) 150 MG TABLET    Take 1 tablet (150 mg total) by mouth once. After completion of antibiotics   NAPROXEN (NAPROSYN) 500 MG TABLET    Take 1 tablet (500 mg total) by mouth 2 (two) times daily with a meal.     Evon Slack, PA-C 02/16/17 2251    Don Perking Washington, MD 02/16/17 [phone]

## 2017-02-16 NOTE — ED Notes (Addendum)
See triage note  States she tripped   fell hitting left lower leg about 1 week ago Swelling and bruising noted to left lower leg  Bruising goes from knee down to ankle

## 2017-02-16 NOTE — ED Triage Notes (Signed)
Patient presents to ED via POV from home post fall. Patient tripped while attempted to get into bed. Bruising and edema noted to left shin.

## 2017-02-16 NOTE — ED Notes (Signed)
TED hose placed on pt's left leg up to the knee; pt provided with home care instructions for wearing TED hose at home.

## 2017-03-14 ENCOUNTER — Other Ambulatory Visit: Payer: Self-pay | Admitting: Internal Medicine

## 2017-03-14 DIAGNOSIS — M79605 Pain in left leg: Secondary | ICD-10-CM

## 2017-03-16 ENCOUNTER — Ambulatory Visit
Admission: RE | Admit: 2017-03-16 | Discharge: 2017-03-16 | Disposition: A | Discharge status: Home / Self Care | Payer: BC Managed Care – PPO | Source: Ambulatory Visit | Attending: Internal Medicine | Admitting: Internal Medicine

## 2017-03-16 DIAGNOSIS — M79605 Pain in left leg: Secondary | ICD-10-CM | POA: Diagnosis present

## 2017-04-25 ENCOUNTER — Other Ambulatory Visit: Payer: Self-pay | Admitting: Internal Medicine

## 2017-04-25 DIAGNOSIS — Z1231 Encounter for screening mammogram for malignant neoplasm of breast: Secondary | ICD-10-CM

## 2017-05-17 ENCOUNTER — Other Ambulatory Visit: Payer: Self-pay | Admitting: Internal Medicine

## 2017-05-17 ENCOUNTER — Ambulatory Visit
Admission: RE | Admit: 2017-05-17 | Discharge: 2017-05-17 | Disposition: A | Discharge status: Home / Self Care | Payer: BC Managed Care – PPO | Source: Ambulatory Visit | Attending: Internal Medicine | Admitting: Internal Medicine

## 2017-05-17 DIAGNOSIS — Z1231 Encounter for screening mammogram for malignant neoplasm of breast: Secondary | ICD-10-CM | POA: Diagnosis present

## 2017-06-07 ENCOUNTER — Inpatient Hospital Stay
Admission: EM | Admit: 2017-06-07 | Discharge: 2017-06-12 | DRG: 193 | Disposition: A | Discharge status: Home / Self Care | Payer: BC Managed Care – PPO | Attending: Internal Medicine | Admitting: Internal Medicine

## 2017-06-07 ENCOUNTER — Other Ambulatory Visit: Payer: Self-pay

## 2017-06-07 ENCOUNTER — Emergency Department: Payer: BC Managed Care – PPO

## 2017-06-07 ENCOUNTER — Encounter: Payer: Self-pay | Admitting: Emergency Medicine

## 2017-06-07 DIAGNOSIS — Z9049 Acquired absence of other specified parts of digestive tract: Secondary | ICD-10-CM | POA: Diagnosis not present

## 2017-06-07 DIAGNOSIS — J9601 Acute respiratory failure with hypoxia: Secondary | ICD-10-CM | POA: Diagnosis present

## 2017-06-07 DIAGNOSIS — B373 Candidiasis of vulva and vagina: Secondary | ICD-10-CM | POA: Diagnosis present

## 2017-06-07 DIAGNOSIS — R0600 Dyspnea, unspecified: Secondary | ICD-10-CM

## 2017-06-07 DIAGNOSIS — Z6841 Body Mass Index (BMI) 40.0 and over, adult: Secondary | ICD-10-CM

## 2017-06-07 DIAGNOSIS — J189 Pneumonia, unspecified organism: Principal | ICD-10-CM | POA: Diagnosis present

## 2017-06-07 DIAGNOSIS — F419 Anxiety disorder, unspecified: Secondary | ICD-10-CM | POA: Diagnosis present

## 2017-06-07 DIAGNOSIS — R030 Elevated blood-pressure reading, without diagnosis of hypertension: Secondary | ICD-10-CM | POA: Diagnosis present

## 2017-06-07 DIAGNOSIS — Z9884 Bariatric surgery status: Secondary | ICD-10-CM | POA: Diagnosis not present

## 2017-06-07 DIAGNOSIS — Z79899 Other long term (current) drug therapy: Secondary | ICD-10-CM | POA: Diagnosis not present

## 2017-06-07 DIAGNOSIS — G4730 Sleep apnea, unspecified: Secondary | ICD-10-CM | POA: Diagnosis present

## 2017-06-07 DIAGNOSIS — J181 Lobar pneumonia, unspecified organism: Secondary | ICD-10-CM

## 2017-06-07 DIAGNOSIS — F17210 Nicotine dependence, cigarettes, uncomplicated: Secondary | ICD-10-CM | POA: Diagnosis present

## 2017-06-07 LAB — BASIC METABOLIC PANEL
Anion gap: 7 (ref 5–15)
BUN: 10 mg/dL (ref 6–20)
CHLORIDE: 102 mmol/L (ref 101–111)
CO2: 25 mmol/L (ref 22–32)
Calcium: 8.4 mg/dL — ABNORMAL LOW (ref 8.9–10.3)
Creatinine, Ser: 0.85 mg/dL (ref 0.44–1.00)
GFR calc Af Amer: >60 mL/min (ref >60)
GFR calc non Af Amer: >60 mL/min (ref >60)
GLUCOSE: 126 mg/dL — AB (ref 65–99)
POTASSIUM: 3.8 mmol/L (ref 3.5–5.1)
SODIUM: 134 mmol/L — AB (ref 135–145)

## 2017-06-07 LAB — CBC
HEMATOCRIT: 41.4 % (ref 35.0–47.0)
HEMOGLOBIN: 13.8 g/dL (ref 12.0–16.0)
MCH: 31 pg (ref 26.0–34.0)
MCHC: 33.5 g/dL (ref 32.0–36.0)
MCV: 92.5 fL (ref 80.0–100.0)
Platelets: 205 10*3/uL (ref 150–440)
RBC: 4.47 MIL/uL (ref 3.80–5.20)
RDW: 15 % — ABNORMAL HIGH (ref 11.5–14.5)
WBC: 8.4 10*3/uL (ref 3.6–11.0)

## 2017-06-07 LAB — GLUCOSE, CAPILLARY
GLUCOSE-CAPILLARY: 161 mg/dL — AB (ref 65–99)
Glucose-Capillary: 154 mg/dL — ABNORMAL HIGH (ref 65–99)

## 2017-06-07 LAB — INFLUENZA PANEL BY PCR (TYPE A & B)
Influenza A By PCR: NEGATIVE
Influenza B By PCR: NEGATIVE

## 2017-06-07 LAB — TROPONIN I

## 2017-06-07 MED ORDER — LAMOTRIGINE 100 MG PO TABS
200.0000 mg | ORAL_TABLET | Freq: Every day | ORAL | Status: DC
  Administered 2017-06-07 – 2017-06-11 (×5): 200 mg via ORAL
  Filled 2017-06-07 (×5): qty 2

## 2017-06-07 MED ORDER — LEVOFLOXACIN IN D5W 750 MG/150ML IV SOLN
750.0000 mg | Freq: Once | INTRAVENOUS | Status: AC
  Administered 2017-06-07: 750 mg via INTRAVENOUS
  Filled 2017-06-07: qty 150

## 2017-06-07 MED ORDER — VENLAFAXINE HCL 37.5 MG PO TABS
75.0000 mg | ORAL_TABLET | Freq: Two times a day (BID) | ORAL | Status: DC
  Administered 2017-06-07 – 2017-06-12 (×11): 75 mg via ORAL
  Filled 2017-06-07 (×3): qty 2
  Filled 2017-06-07: qty 1
  Filled 2017-06-07 (×7): qty 2

## 2017-06-07 MED ORDER — LEVOFLOXACIN IN D5W 750 MG/150ML IV SOLN
750.0000 mg | INTRAVENOUS | Status: DC
  Administered 2017-06-08 – 2017-06-09 (×2): 750 mg via INTRAVENOUS
  Filled 2017-06-07 (×2): qty 150

## 2017-06-07 MED ORDER — DOCUSATE SODIUM 100 MG PO CAPS
100.0000 mg | ORAL_CAPSULE | Freq: Two times a day (BID) | ORAL | Status: DC
  Administered 2017-06-10: 100 mg via ORAL
  Filled 2017-06-07 (×7): qty 1

## 2017-06-07 MED ORDER — ACETAMINOPHEN 650 MG RE SUPP: 650.0000 mg | Freq: Four times a day (QID) | RECTAL | Status: DC | PRN

## 2017-06-07 MED ORDER — VITAMIN D 1000 UNITS PO TABS
1000.0000 [IU] | ORAL_TABLET | Freq: Every day | ORAL | Status: DC
  Administered 2017-06-08 – 2017-06-12 (×5): 1000 [IU] via ORAL
  Filled 2017-06-07 (×5): qty 1

## 2017-06-07 MED ORDER — ACETAMINOPHEN 325 MG PO TABS
650.0000 mg | ORAL_TABLET | Freq: Four times a day (QID) | ORAL | Status: DC | PRN
  Administered 2017-06-07 – 2017-06-12 (×6): 650 mg via ORAL
  Filled 2017-06-07 (×6): qty 2

## 2017-06-07 MED ORDER — FLUCONAZOLE 100MG IVPB
100.0000 mg | INTRAVENOUS | Status: DC
  Administered 2017-06-07 – 2017-06-08 (×2): 100 mg via INTRAVENOUS
  Filled 2017-06-07 (×2): qty 50

## 2017-06-07 MED ORDER — ENOXAPARIN SODIUM 40 MG/0.4ML ~~LOC~~ SOLN
40.0000 mg | Freq: Two times a day (BID) | SUBCUTANEOUS | Status: DC
  Administered 2017-06-10: 40 mg via SUBCUTANEOUS
  Filled 2017-06-07 (×7): qty 0.4

## 2017-06-07 MED ORDER — ONDANSETRON HCL 4 MG PO TABS: 4.0000 mg | ORAL_TABLET | Freq: Four times a day (QID) | ORAL | Status: DC | PRN

## 2017-06-07 MED ORDER — IPRATROPIUM-ALBUTEROL 0.5-2.5 (3) MG/3ML IN SOLN
3.0000 mL | Freq: Once | RESPIRATORY_TRACT | Status: AC
  Administered 2017-06-07: 3 mL via RESPIRATORY_TRACT

## 2017-06-07 MED ORDER — ONDANSETRON HCL 4 MG/2ML IJ SOLN
4.0000 mg | Freq: Four times a day (QID) | INTRAMUSCULAR | Status: DC | PRN
Start: 2017-06-07 — End: 2017-06-12

## 2017-06-07 MED ORDER — IPRATROPIUM-ALBUTEROL 0.5-2.5 (3) MG/3ML IN SOLN
3.0000 mL | Freq: Once | RESPIRATORY_TRACT | Status: AC
  Administered 2017-06-07: 3 mL via RESPIRATORY_TRACT
  Filled 2017-06-07: qty 6

## 2017-06-07 MED ORDER — METHYLPREDNISOLONE SODIUM SUCC 40 MG IJ SOLR
40.0000 mg | Freq: Four times a day (QID) | INTRAMUSCULAR | Status: AC
  Administered 2017-06-07 (×2): 40 mg via INTRAVENOUS
  Filled 2017-06-07 (×2): qty 1

## 2017-06-07 MED ORDER — METHYLPREDNISOLONE SODIUM SUCC 125 MG IJ SOLR
125.0000 mg | Freq: Once | INTRAMUSCULAR | Status: AC
  Administered 2017-06-07: 125 mg via INTRAVENOUS
  Filled 2017-06-07: qty 2

## 2017-06-07 MED ORDER — IPRATROPIUM-ALBUTEROL 0.5-2.5 (3) MG/3ML IN SOLN
3.0000 mL | Freq: Four times a day (QID) | RESPIRATORY_TRACT | Status: DC
  Administered 2017-06-07 – 2017-06-12 (×19): 3 mL via RESPIRATORY_TRACT
  Filled 2017-06-07 (×20): qty 3

## 2017-06-07 MED ORDER — METHYLPREDNISOLONE SODIUM SUCC 125 MG IJ SOLR
60.0000 mg | INTRAMUSCULAR | Status: DC
  Administered 2017-06-08: 60 mg via INTRAVENOUS
  Filled 2017-06-07: qty 2

## 2017-06-07 MED ORDER — ALBUTEROL SULFATE (2.5 MG/3ML) 0.083% IN NEBU: 2.5000 mg | INHALATION_SOLUTION | Freq: Four times a day (QID) | RESPIRATORY_TRACT | Status: DC | PRN

## 2017-06-07 MED ORDER — SODIUM CHLORIDE 0.9 % IV SOLN
INTRAVENOUS | Status: DC
  Administered 2017-06-07: 12:00:00 via INTRAVENOUS

## 2017-06-07 MED ORDER — HYDROCOD POLST-CPM POLST ER 10-8 MG/5ML PO SUER
5.0000 mL | Freq: Two times a day (BID) | ORAL | Status: DC | PRN
  Administered 2017-06-07 – 2017-06-12 (×9): 5 mL via ORAL
  Filled 2017-06-07 (×9): qty 5

## 2017-06-07 NOTE — H&P (Signed)
[facility] [place] [facility]   PATIENT NAME: Linda Ortiz Nall    MR#:  [id number]  DATE OF BIRTH:  03/31/1971  DATE OF ADMISSION:  06/07/2017  PRIMARY CARE PHYSICIAN: Lynnea FerrierKlein, Bert J III, MD   REQUESTING/REFERRING PHYSICIAN:  CHIEF COMPLAINT:   Chief Complaint  Patient presents with  . Shortness of Breath    HISTORY OF PRESENT ILLNESS:  Linda Ortiz Galka  is a 46 y.o. female with a known history of anxiety, morbid obesity came in because of shortness of breath for 5 days associated with low-grade temperature, dry cough.  Patient was very short of breath so family called the ambulance.  No phlegm.  Dry cough, shortness of breath, wheezing.  PAST MEDICAL HISTORY:   Past Medical History:  Diagnosis Date  . Anxiety   . Complication of anesthesia   . Diverticulitis   . Dysmenorrhea   . Endometriosis   . Hematoma of right lower extremity   . Menorrhagia   . Morbid obesity (HCC)   . Pancreatitis, acute   . PCOS (polycystic ovarian syndrome)   . Pneumonia   . PONV (postoperative nausea and vomiting)    General Anesthesia  . Rib fracture    Left     PAST SURGICAL HISTOIRY:   Past Surgical History:  Procedure Laterality Date  . APPENDECTOMY  2005  . ARTHROSCOPY KNEE W/ DRILLING    . BARIATRIC SURGERY  2007  . CHOLECYSTECTOMY N/A 10/19/2015   Procedure: LAPAROSCOPIC CHOLECYSTECTOMY ;  Surgeon: Leafy Roiego F Pabon, MD;  Location: [facility] ORS;  Service: General;  Laterality: N/A;  . COLONOSCOPY WITH PROPOFOL N/A 06/03/2016   Procedure: COLONOSCOPY WITH PROPOFOL;  Surgeon: Christena DeemMartin U Skulskie, MD;  [facility];  Service: Endoscopy;  Laterality: N/A;  . ESOPHAGOGASTRODUODENOSCOPY (EGD) WITH PROPOFOL N/A 06/03/2016   Procedure: ESOPHAGOGASTRODUODENOSCOPY (EGD) WITH PROPOFOL;  Surgeon: Christena DeemMartin U Skulskie, MD;  [facility];  Service: Endoscopy;  Laterality: N/A;  . EYE SURGERY  2017    SOCIAL HISTORY:   Social History   Tobacco Use  .  Smoking status: Current Every Day Smoker    Packs/day: 0.25    Years: 8.00    Pack years: 2.00    Types: Cigarettes    Last attempt to quit: 09/12/2015    Years since quitting: 1.7  . Smokeless tobacco: Never Used  Substance Use Topics  . Alcohol use: No    FAMILY HISTORY:   Family History  Problem Relation Age of Onset  . Arthritis Mother        rheumatoid  . Hypertension Mother   . Hyperlipidemia Father   . Mental illness Maternal Grandmother        dementia  . Alcohol abuse Maternal Grandmother   . ALS Paternal Grandmother   . Breast cancer Neg Hx     DRUG ALLERGIES:   Allergies  Allergen Reactions  . Morphine And Related Itching    REVIEW OF SYSTEMS:  CONSTITUTIONAL: No fever, fatigue or weakness.  EYES: No blurred or double vision.  EARS, NOSE, AND THROAT: No tinnitus or ear pain.  RESPIRATORY: Cough, shortness of breath, wheezing. CARDIOVASCULAR: No chest pain, orthopnea, edema.  GASTROINTESTINAL: No nausea, vomiting, diarrhea or abdominal pain.  GENITOURINARY: No dysuria, hematuria.  ENDOCRINE: No polyuria, nocturia,  HEMATOLOGY: No anemia, easy bruising or bleeding SKIN: No rash or lesion. MUSCULOSKELETAL: No joint pain or arthritis.   NEUROLOGIC: No tingling, numbness, weakness.  PSYCHIATRY: No anxiety or depression.   MEDICATIONS AT HOME:  Prior to Admission medications   Medication Sig Start Date End Date Taking? Authorizing Provider  cholecalciferol (VITAMIN D) 1000 units tablet Take 1,000 Units by mouth daily.   Yes [provider]  lamoTRIgine (LAMICTAL) 200 MG tablet Take 200 mg by mouth daily.  01/12/16  Yes [provider]  venlafaxine (EFFEXOR) 75 MG tablet Take 1 tablet (75 mg total) by mouth 2 (two) times daily. Patient taking differently: Take 150 mg by mouth daily.  12/08/15  Yes Hildred Laser, MD  vitamin B-12 (CYANOCOBALAMIN) 100 MCG tablet Take 100 mcg by mouth daily.   Yes [provider]  ciprofloxacin (CIPRO)  500 MG tablet Take 1 tablet (500 mg total) by mouth 2 (two) times daily. Patient not taking: Reported on 06/07/2017 12/09/15   Ricarda Frame, MD  cyclobenzaprine (FLEXERIL) 10 MG tablet Take 1 tablet (10 mg total) by mouth 3 (three) times daily as needed for muscle spasms. Patient not taking: Reported on 06/07/2017 12/11/15   Gladis Riffle, MD  fluconazole (DIFLUCAN) 150 MG tablet Take 1 tablet (150 mg total) by mouth daily. Patient not taking: Reported on 06/07/2017 12/09/15   Ricarda Frame, MD  LUPRON DEPOT, 71-MONTH, 3.75 MG injection RECONSTITUTE AS DIRECTED. INJECT 3.75 MG INTRAMUSCULARLY ONCE MONTHLY. Patient not taking: Reported on 06/07/2017 02/08/16   Hildred Laser, MD  medroxyPROGESTERone (PROVERA) 5 MG tablet Take 1 tablet (5 mg total) by mouth daily. Patient not taking: Reported on 06/07/2017 12/01/15   Hildred Laser, MD  metroNIDAZOLE (FLAGYL) 500 MG tablet Take 1 tablet (500 mg total) by mouth 3 (three) times daily. Patient not taking: Reported on 06/07/2017 12/09/15   Ricarda Frame, MD  naproxen (NAPROSYN) 500 MG tablet Take 1 tablet (500 mg total) by mouth 2 (two) times daily with a meal. Patient not taking: Reported on 06/07/2017 02/16/17   Evon Slack, PA-C  ondansetron (ZOFRAN) 4 MG tablet Take 1 tablet (4 mg total) by mouth every 4 (four) hours as needed for nausea. Patient not taking: Reported on 06/07/2017 10/20/15   Sterling Big F, MD      VITAL SIGNS:  Blood pressure (!) 154/83, pulse 91, temperature 98.2 F (36.8 C), temperature source Oral, resp. rate 19, height 5\' 3"  (1.6 m), weight (!) 163.7 kg (361 lb), SpO2 94 %.  PHYSICAL EXAMINATION:  GENERAL:  46 y.o.-year-old patient lying in the bed with no acute distress.  EYES: Pupils equal, round, reactive to light .no scleral icterus. Extraocular muscles intact.  HEENT: Head atraumatic, normocephalic. Oropharynx and nasopharynx clear.  NECK:  Supple, no jugular venous distention. No thyroid enlargement, no  tenderness.  LUNGS: Patient has expiratory wheezing left lung especially in the left upper lobe, faint wheezing bilaterally in both lower lobes.   CARDIOVASCULAR: S1, S2 normal. No murmurs, rubs, or gallops.  ABDOMEN: Soft, nontender, nondistended. Bowel sounds present. No organomegaly or mass.  EXTREMITIES: No pedal edema, cyanosis, or clubbing.  NEUROLOGIC: Cranial nerves II through XII are intact. Muscle strength 5/5 in all extremities. Sensation intact. Gait not checked.  PSYCHIATRIC: The patient is alert and oriented x 3.  SKIN: No obvious rash, lesion, or ulcer.   LABORATORY PANEL:   CBC Recent Labs  Lab 06/07/17 0640  WBC 8.4  HGB 13.8  HCT 41.4  PLT 205   ------------------------------------------------------------------------------------------------------------------  Chemistries  Recent Labs  Lab 06/07/17 0640  NA 134*  K 3.8  CL 102  CO2 25  GLUCOSE 126*  BUN 10  CREATININE 0.85  CALCIUM 8.4*   ------------------------------------------------------------------------------------------------------------------  Cardiac Enzymes Recent Labs  Lab 06/07/17 0640  TROPONINI <0.03   ------------------------------------------------------------------------------------------------------------------  RADIOLOGY:  Dg Chest Portable 1 View  Result Date: 06/07/2017 CLINICAL DATA:  Fever, cough, and shortness of breath. EXAM: PORTABLE CHEST 1 VIEW COMPARISON:  None. FINDINGS: Image quality is degraded by patient body habitus. The cardiomediastinal silhouette is within normal limits. There are coarse lung markings in both bases with mild patchy opacity on the right. No sizable pleural effusion or pneumothorax is identified. No acute osseous abnormality is seen. IMPRESSION: Right greater than left basilar lung opacities which may reflect pneumonia or atelectasis. Electronically Signed   By: Sebastian AcheAllen  Grady M.D.   On: 06/07/2017 07:17    EKG:   Orders placed or performed  during the hospital encounter of 06/07/17  . ED EKG  . ED EKG  . ED EKG  . ED EKG    IMPRESSION AND PLAN:  46 year old female patient with acute respiratory failure secondary to community-acquired pneumonia.  Patient labs are largely within normal limits but patient continued to have tachypnea, expiratory wheeze, th hypoxia of O2 sat dropping to 90% on exertion on room air.  Admit to hospital service, continue IV antibiotics, IV steroids, bronchodilators every 4 hours, possible discharge tomorrow.  Add incentive spirometry, encourage ambulation, will check room air oxygen levels to see if she needs oxygen.  Right now she is on 1 L of oxygen.  All the records are reviewed and case discussed with ED provider. Management plans discussed with the patient, family and they are in agreement.  CODE STATUS: full  TOTAL TIME TAKING CARE OF THIS PATIENT: 35 minutes.    Katha HammingSnehalatha Merwin Breden M.D on 06/07/2017 at 1:34 PM  Between 7am to 6pm - Pager - [phone]  After 6pm go to [email or web address] - password [email or web address] [facility]  [facility]  Office  [phone]  CC: Primary care physician; Lynnea FerrierKlein, Bert J III, MD  Note: This dictation was prepared with Dragon dictation along with smaller phrase technology. Any transcriptional errors that result from this process are unintentional.

## 2017-06-07 NOTE — Consult Note (Signed)
Pharmacy Antibiotic Note  Linda Ortiz is a [age] y.o. female admitted on 06/07/2017 with pneumonia.  Pharmacy has been consulted for levofloxacin dosing.  Plan: Start Levofloxacin 750mg  every 24 hours.   Height: 5\' 3"  (160 cm) Weight: (!) 361 lb (163.7 kg) IBW/kg (Calculated) : 52.4  Temp (24hrs), Avg:98.9 F (37.2 C), Min:98.9 F (37.2 C), Max:98.9 F (37.2 C)  Recent Labs  Lab 06/07/17 0640  WBC 8.4  CREATININE 0.85    Estimated Creatinine Clearance: 129.2 mL/min (by C-G formula based on SCr of 0.85 mg/dL).    Allergies  Allergen Reactions  . Morphine And Related Itching    Antimicrobials this admission: 12/26 levofloxacin >>   Dose adjustments this admission:  Microbiology results: 12/26 BCx: sent  Thank you for allowing pharmacy to be a part of this patient's care.  Gardner CandleSheema M Zayvien Canning, PharmD, BCPS Clinical Pharmacist 06/07/2017 9:49 AM

## 2017-06-07 NOTE — ED Provider Notes (Signed)
[facility] Emergency Department Provider Note  Time seen: 7:18 AM  I have reviewed the triage vital signs and the nursing notes.   HISTORY  Chief Complaint Shortness of Breath    HPI Linda Ortiz is a 46 y.o. female with a past medical history of anxiety, obesity, presents to the emergency department for difficulty breathing, cough and fever.  According to the patient for the past 5 days she has been coughing now with wheeze and difficulty breathing.  Patient states initial fever but no fever currently.  Patient started on Zithromax yesterday but continued to feel worse with difficulty breathing today so she came to the emergency department for evaluation.  Patient states chest tightness/burning sensation worse with cough.  States one episode of vomiting several days ago but none since.  No abdominal pain.   Past Medical History:  Diagnosis Date  . Anxiety   . Complication of anesthesia   . Diverticulitis   . Dysmenorrhea   . Endometriosis   . Hematoma of right lower extremity   . Menorrhagia   . Morbid obesity (HCC)   . Pancreatitis, acute   . PCOS (polycystic ovarian syndrome)   . Pneumonia   . PONV (postoperative nausea and vomiting)    General Anesthesia  . Rib fracture    Left     Patient Active Problem List   Diagnosis Date Noted  . OSA (obstructive sleep apnea) 03/01/2016  . BMI 60.0-69.9, adult (HCC) 02/29/2016  . Acute gallstone pancreatitis   . Depression 10/14/2015  . Pancreatitis, acute 10/14/2015  . Gallstones 10/14/2015  . AKI (acute kidney injury) (HCC) 10/14/2015  . Diverticulitis large intestine 08/25/2015  . Diverticulitis of large intestine with perforation without bleeding   . Morbid obesity with BMI of 70 and over, adult (HCC) 12/05/2014  . Menorrhagia 12/05/2014  . Dysmenorrhea 12/05/2014  . PCOS (polycystic ovarian syndrome) 12/05/2014  . Endometriosis 12/05/2014  . 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 Routine general medical examination at a health care facility 05/05/2013  . Elevated blood pressure reading without diagnosis of hypertension 05/05/2013  . Pain of both elbows 05/05/2013  . Cyst of joint of ankle or foot 03/23/2013  . Vitamin D deficiency 07/11/2012  . Status post gastric bypass for obesity 07/11/2012  . Generalized anxiety disorder     Past Surgical History:  Procedure Laterality Date  . APPENDECTOMY  2005  . ARTHROSCOPY KNEE W/ DRILLING    . BARIATRIC SURGERY  2007  . CHOLECYSTECTOMY N/A 10/19/2015   Procedure: LAPAROSCOPIC CHOLECYSTECTOMY ;  Surgeon: Leafy Ro, MD;  Location: [facility] ORS;  Service: General;  Laterality: N/A;  . COLONOSCOPY WITH PROPOFOL N/A 06/03/2016   Procedure: COLONOSCOPY WITH PROPOFOL;  Surgeon: Christena Deem, MD;  Location: [facility] ENDOSCOPY;  Service: Endoscopy;  Laterality: N/A;  . ESOPHAGOGASTRODUODENOSCOPY (EGD) WITH PROPOFOL N/A 06/03/2016   Procedure: ESOPHAGOGASTRODUODENOSCOPY (EGD) WITH PROPOFOL;  Surgeon: Christena Deem, MD;  Location: [facility] ENDOSCOPY;  Service: Endoscopy;  Laterality: N/A;  . EYE SURGERY  2017    Prior to Admission medications   Medication Sig Start Date End Date Taking? Authorizing Provider  cetirizine (ZYRTEC) 10 MG tablet Take 10 mg by mouth daily.    [provider]  cholecalciferol (VITAMIN D) 1000 units tablet Take 1,000 Units by mouth daily.    [provider]  ciprofloxacin (CIPRO) 500 MG tablet Take 1 tablet (500 mg total) by mouth 2 (two) times daily. 12/09/15  Ricarda FrameWoodham, Charles, MD  Cyanocobalamin 1500 MCG TBDP Take by mouth.    [provider]  cyclobenzaprine (FLEXERIL) 10 MG tablet Take 1 tablet (10 mg total) by mouth 3 (three) times daily as needed for muscle spasms. 12/11/15   Gladis RiffleLoflin, Catherine L, MD  fluconazole (DIFLUCAN) 150 MG tablet Take 1 tablet (150 mg total) by mouth daily. 12/09/15   Ricarda FrameWoodham, Charles, MD  Lactobacillus Rhamnosus, GG,  (CULTURELLE PO) Take by mouth.    [provider]  Lactobacillus-Inulin (CULTURELLE DIGESTIVE HEALTH) CAPS Take 1 capsule by mouth 2 (two) times daily.    [provider]  lamoTRIgine (LAMICTAL) 200 MG tablet  01/12/16   [provider]  LUPRON DEPOT, 65-MONTH, 3.75 MG injection RECONSTITUTE AS DIRECTED. INJECT 3.75 MG INTRAMUSCULARLY ONCE MONTHLY. 02/08/16   Hildred Laserherry, Anika, MD  medroxyPROGESTERone (PROVERA) 5 MG tablet Take 1 tablet (5 mg total) by mouth daily. 12/01/15   Hildred Laserherry, Anika, MD  metroNIDAZOLE (FLAGYL) 500 MG tablet Take 1 tablet (500 mg total) by mouth 3 (three) times daily. 12/09/15   Ricarda FrameWoodham, Charles, MD  naproxen (NAPROSYN) 500 MG tablet Take 1 tablet (500 mg total) by mouth 2 (two) times daily with a meal. 02/16/17   Evon SlackGaines, Thomas C, PA-C  ondansetron (ZOFRAN) 4 MG tablet Take 1 tablet (4 mg total) by mouth every 4 (four) hours as needed for nausea. 10/20/15   Pabon, Diego F, MD  oxycodone (OXY-IR) 5 MG capsule Take 5 mg by mouth every 6 (six) hours as needed.    [provider]  sertraline (ZOLOFT) 100 MG tablet  01/29/16   [provider]  venlafaxine (EFFEXOR) 75 MG tablet Take 1 tablet (75 mg total) by mouth 2 (two) times daily. 12/08/15   Hildred Laserherry, Anika, MD  zolpidem (AMBIEN) 10 MG tablet Take 10 mg by mouth at bedtime. 11/05/15   [provider]    Allergies  Allergen Reactions  . Morphine And Related Itching    Family History  Problem Relation Age of Onset  . Arthritis Mother        rheumatoid  . Hypertension Mother   . Hyperlipidemia Father   . Mental illness Maternal Grandmother        dementia  . Alcohol abuse Maternal Grandmother   . ALS Paternal Grandmother   . Breast cancer Neg Hx     Social History Social History   Tobacco Use  . Smoking status: Current Every Day Smoker    Packs/day: 0.25    Years: 8.00    Pack years: 2.00    Types: Cigarettes    Last attempt to quit: 09/12/2015    Years since quitting: 1.7   . Smokeless tobacco: Never Used  Substance Use Topics  . Alcohol use: No  . Drug use: No    Review of Systems Constitutional: Positive for fever Cardiovascular: Intermittent chest tightness/burning sensation Respiratory: Moderate shortness of breath.  Positive for cough.  States green sputum. Gastrointestinal: Negative for abdominal pain.  Vomiting x1. Musculoskeletal: Negative for leg pain or swelling. All other ROS negative  ____________________________________________   PHYSICAL EXAM:  VITAL SIGNS: ED Triage Vitals  Enc Vitals Group     BP 06/07/17 0640 (!) 160/93     Pulse Rate 06/07/17 0640 95     Resp 06/07/17 0640 (!) 24     Temp 06/07/17 0640 98.9 F (37.2 C)     Temp Source 06/07/17 0640 Oral     SpO2 06/07/17 0640 96 %  Weight 06/07/17 0638 (!) 361 lb (163.7 kg)     Height 06/07/17 0638 5\' 3"  (1.6 m)     Head Circumference --      Peak Flow --      Pain Score 06/07/17 0637 4     Pain Loc --      Pain Edu? --      Excl. in GC? --    Constitutional: Alert and oriented. Well appearing and in no distress. Eyes: Normal exam ENT   Head: Normocephalic and atraumatic.   Mouth/Throat: Mucous membranes are moist. Cardiovascular: Normal rate, regular rhythm. No murmur Respiratory: Mild tachypnea, moderate expiratory wheeze bilaterally.  No rales or rhonchi. Gastrointestinal: Obese, soft and nontender. Musculoskeletal: Nontender with normal range of motion in all extremities. No lower extremity tenderness or edema. Neurologic:  Normal speech and language. No gross focal neurologic deficits Skin:  Skin is warm, dry and intact.  Psychiatric: Mood and affect are normal.  ____________________________________________    EKG  EKG reviewed and interpreted by myself shows normal sinus rhythm at 90 bpm with a narrow QRS, normal axis, normal intervals, no concerning ST changes  ____________________________________________    RADIOLOGY  IMPRESSION: Right  greater than left basilar lung opacities which may reflect pneumonia or atelectasis.  ____________________________________________   INITIAL IMPRESSION / ASSESSMENT AND PLAN / ED COURSE  Pertinent labs & imaging results that were available during my care of the patient were reviewed by me and considered in my medical decision making (see chart for details).  Patient presents to the emergency department for shortness of breath fever and cough.  Differential would include pneumonia, upper respiratory infection, influenza, bronchitis.  Patient has moderate expiratory wheeze in all lung fields, no history of asthma or COPD per patient.  Given cough and subjective fever we will also check labs, chest x-ray, treat with DuoNeb's, Solu-Medrol and continue to closely monitor in the emergency department.  Overall the patient appears well, no acute distress but does have audible wheezing.  Patient's workup shows possible pneumonia on x-ray.  Labs are largely within normal limits.  Patient continues to be tachypneic currently around 30 breaths/min with continued expiratory wheeze without history of COPD or asthma.  Patient satting around 94% currently.  Given the patient's continued wheeze continued shortness of breath continued tachypnea with despite breathing treatments we will admit to the hospital for further treatment.  Patient agreeable to this plan of care.  ____________________________________________   FINAL CLINICAL IMPRESSION(S) / ED DIAGNOSES  Community-acquired pneumonia    Minna AntisPaduchowski, Ohn Bostic, MD 06/07/17 [phone]

## 2017-06-07 NOTE — Progress Notes (Signed)
PHARMACIST - PHYSICIAN COMMUNICATION  CONCERNING:  Enoxaparin (Lovenox) for DVT Prophylaxis    RECOMMENDATION: Patient was prescribed enoxaprin 40mg  q24 hours for VTE prophylaxis.   Filed Weights   06/07/17 0638  Weight: (!) 361 lb (163.7 kg)    Body mass index is 63.95 kg/m.  Estimated Creatinine Clearance: 129.2 mL/min (by C-G formula based on SCr of 0.85 mg/dL).   [facility] patient is candidate for enoxaparin 40mg  every 12 hour dosing due to BMI being >40.   DESCRIPTION: Pharmacy has adjusted enoxaparin dose per [facility] policy, approved through P & T committee.  Patient is now receiving enoxaparin 40mg  every 12 hours.    Cher NakaiSheema Teyona Nichelson, PharmD Clinical Pharmacist  06/07/2017 9:45 AM

## 2017-06-07 NOTE — ED Triage Notes (Addendum)
Pt to room 2 via EMS from home; reports fever since Saturday with prod cough green sputum; awoke at 2am with difficulty breathing and had vomiting which she attributed to [place]; EMS admin 1 duoneb enroute for wheezing and placed O2 at 2l/min via [place] for initial sat 94%; began taking z-pak yesterday

## 2017-06-07 NOTE — Progress Notes (Signed)
Walked patient to check for oxygen level, resting pulse ox was 95% in room air and stayed above 92% while ambulating. Coach patient to take slow deep breath as patient is breathing fast while ambulating. Tolerated ambulation. RN will continue to monitor.

## 2017-06-07 NOTE — Progress Notes (Signed)
Talked to Dr. Luberta MutterKonidena about patient's complaints of coughing and requesting tussionex as she takes it at home, order given. Also asked if patient can have PRN breathing treatment, albuterol given. No other concern at the moment. RN will continue to monitor.

## 2017-06-08 LAB — CBC
HCT: 39.7 % (ref 35.0–47.0)
HEMOGLOBIN: 13 g/dL (ref 12.0–16.0)
MCH: 30.3 pg (ref 26.0–34.0)
MCHC: 32.9 g/dL (ref 32.0–36.0)
MCV: 92.2 fL (ref 80.0–100.0)
Platelets: 223 10*3/uL (ref 150–440)
RBC: 4.3 MIL/uL (ref 3.80–5.20)
RDW: 15 % — ABNORMAL HIGH (ref 11.5–14.5)
WBC: 7.6 10*3/uL (ref 3.6–11.0)

## 2017-06-08 LAB — BASIC METABOLIC PANEL
ANION GAP: 9 (ref 5–15)
BUN: 13 mg/dL (ref 6–20)
CALCIUM: 8.7 mg/dL — AB (ref 8.9–10.3)
CHLORIDE: 104 mmol/L (ref 101–111)
CO2: 23 mmol/L (ref 22–32)
Creatinine, Ser: 0.77 mg/dL (ref 0.44–1.00)
GFR calc non Af Amer: >60 mL/min (ref >60)
Glucose, Bld: 153 mg/dL — ABNORMAL HIGH (ref 65–99)
Potassium: 3.8 mmol/L (ref 3.5–5.1)
Sodium: 136 mmol/L (ref 135–145)

## 2017-06-08 LAB — GLUCOSE, CAPILLARY: Glucose-Capillary: 126 mg/dL — ABNORMAL HIGH (ref 65–99)

## 2017-06-08 MED ORDER — AMLODIPINE 1 MG/ML ORAL SUSPENSION: 5.0000 mg | Freq: Every day | ORAL | Status: DC

## 2017-06-08 MED ORDER — METHYLPREDNISOLONE SODIUM SUCC 125 MG IJ SOLR
60.0000 mg | Freq: Two times a day (BID) | INTRAMUSCULAR | Status: DC
  Administered 2017-06-08 – 2017-06-10 (×5): 60 mg via INTRAVENOUS
  Filled 2017-06-08 (×6): qty 2

## 2017-06-08 MED ORDER — AMLODIPINE BESYLATE 5 MG PO TABS
5.0000 mg | ORAL_TABLET | Freq: Every day | ORAL | Status: DC
  Administered 2017-06-08 – 2017-06-12 (×4): 5 mg via ORAL
  Filled 2017-06-08 (×5): qty 1

## 2017-06-08 NOTE — Care Management Note (Signed)
Case Management Note  Patient Details  Name: Graylin Shiverlizabeth J Denz MRN: [id number] Date of Birth: 01/01/1971  Subjective/Objective:                 Admitted from home with pneumonia Thus far, patient has not qualified for home oxygen. No discharge needs identified at present time   Action/Plan:   Expected Discharge Date:                  Expected Discharge Plan:     In-House Referral:     Discharge planning Services     Post Acute Care Choice:    Choice offered to:     DME Arranged:    DME Agency:     HH Arranged:    HH Agency:     Status of Service:     If discussed at [employer] Meetings, dates discussed:    Additional Comments:  Eber HongGreene, Montzerrat Brunell R, RN 06/08/2017, 11:59 AM

## 2017-06-08 NOTE — Progress Notes (Signed)
[facility] [place] [facility]   PATIENT NAME: Linda Ortiz    MR#:  [id number]  DATE OF BIRTH:  12/16/1970  SUBJECTIVE: Admitted because of pneumonia.  Patient states that she is still having cough, shortness of breath but better than yesterday.  CHIEF COMPLAINT:   Chief Complaint  Patient presents with  . Shortness of Breath    REVIEW OF SYSTEMS:   ROS CONSTITUTIONAL: No fever, fatigue or weakness.  EYES: No blurred or double vision.  EARS, NOSE, AND THROAT: No tinnitus or ear pain.  RESPIRATORY: Mild cough, shortness of breath.  Marland Kitchen.  CARDIOVASCULAR: No chest pain, orthopnea, edema.  GASTROINTESTINAL: No nausea, vomiting, diarrhea or abdominal pain.  GENITOURINARY: No dysuria, hematuria.  ENDOCRINE: No polyuria, nocturia,  HEMATOLOGY: No anemia, easy bruising or bleeding SKIN: No rash or lesion. MUSCULOSKELETAL: No joint pain or arthritis.   NEUROLOGIC: No tingling, numbness, weakness.  PSYCHIATRY: No anxiety or depression.   DRUG ALLERGIES:   Allergies  Allergen Reactions  . Morphine And Related Itching    VITALS:  Blood pressure (!) 167/92, pulse 67, temperature (!) 97.5 F (36.4 C), temperature source Oral, resp. rate (!) 22, height 5\' 3"  (1.6 m), weight (!) 164.1 kg (361 lb 12.8 oz), SpO2 95 %.  PHYSICAL EXAMINATION:  GENERAL:  46 y.o.-year-old patient lying in the bed with no acute distress.  EYES: Pupils equal, round, reactive to light and accommodation. No scleral icterus. Extraocular muscles intact.  HEENT: Head atraumatic, normocephalic. Oropharynx and nasopharynx clear.  NECK:  Supple, no jugular venous distention. No thyroid enlargement, no tenderness.  LUNGS: Normal breath sounds bilaterally, no wheezing, rales,rhonchi or crepitation. No use of accessory muscles of respiration.  CARDIOVASCULAR: S1, S2 normal. No murmurs, rubs, or gallops.  ABDOMEN: Soft, nontender, nondistended. Bowel sounds present. No organomegaly or mass.   EXTREMITIES: No pedal edema, cyanosis, or clubbing.  NEUROLOGIC: Cranial nerves II through XII are intact. Muscle strength 5/5 in all extremities. Sensation intact. Gait not checked.  PSYCHIATRIC: The patient is alert and oriented x 3.  SKIN: No obvious rash, lesion, or ulcer.    LABORATORY PANEL:   CBC Recent Labs  Lab 06/08/17 0335  WBC 7.6  HGB 13.0  HCT 39.7  PLT 223   ------------------------------------------------------------------------------------------------------------------  Chemistries  Recent Labs  Lab 06/08/17 0335  NA 136  K 3.8  CL 104  CO2 23  GLUCOSE 153*  BUN 13  CREATININE 0.77  CALCIUM 8.7*   ------------------------------------------------------------------------------------------------------------------  Cardiac Enzymes Recent Labs  Lab 06/07/17 0640  TROPONINI <0.03   ------------------------------------------------------------------------------------------------------------------  RADIOLOGY:  Dg Chest Portable 1 View  Result Date: 06/07/2017 CLINICAL DATA:  Fever, cough, and shortness of breath. EXAM: PORTABLE CHEST 1 VIEW COMPARISON:  None. FINDINGS: Image quality is degraded by patient body habitus. The cardiomediastinal silhouette is within normal limits. There are coarse lung markings in both bases with mild patchy opacity on the right. No sizable pleural effusion or pneumothorax is identified. No acute osseous abnormality is seen. IMPRESSION: Right greater than left basilar lung opacities which may reflect pneumonia or atelectasis. Electronically Signed   By: Sebastian AcheAllen  Grady M.D.   On: 06/07/2017 07:17    EKG:   Orders placed or performed during the hospital encounter of 06/07/17  . ED EKG  . ED EKG  . ED EKG  . ED EKG    ASSESSMENT AND PLAN:   #1 community-acquired pneumonia causing shortness of breath,.  Continue Levaquin, bronchodilators, steroids. 2.  History of  smoking no quit: 3.  Morbid obesity: Patient has a history  of gastric bypass surgery, patient is in the process of getting insurance approval, possible duodenal [place]. 4.  History of depression, anxiety: Continue Lamictal, venlafaxine. 5 elevated blood pressure without history of hypertension: Added amlodipine. #6 history of incisional hernia after gallbladder operation. 7.  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 8. PC OS;  All the records are reviewed a.nd case discussed with Care Management/Social Workerr. Management plans discussed with the patient, family and they are in agreement.  CODE STATUS:full  TOTAL TIME TAKING CARE OF THIS PATIENT: 35minutes.   POSSIBLE D/C IN 1-2 DAYS, DEPENDING ON CLINICAL CONDITION.   Katha HammingSnehalatha Canuto Kingston M.D on 06/08/2017 at 9:58 AM  Between 7am to 6pm - Pager - [phone]  After 6pm go to [email or web address] - password [email or web address] [facility]  [facility]  Office  [phone]  CC: Primary care physician; Lynnea FerrierKlein, Bert J III, MD   Note: This dictation was prepared with Dragon dictation along with smaller phrase technology. Any transcriptional errors that result from this process are unintentional.

## 2017-06-09 ENCOUNTER — Inpatient Hospital Stay: Payer: BC Managed Care – PPO

## 2017-06-09 LAB — GLUCOSE, CAPILLARY: GLUCOSE-CAPILLARY: 115 mg/dL — AB (ref 65–99)

## 2017-06-09 LAB — HIV ANTIBODY (ROUTINE TESTING W REFLEX): HIV SCREEN 4TH GENERATION: NONREACTIVE

## 2017-06-09 MED ORDER — ZOLPIDEM TARTRATE 5 MG PO TABS
5.0000 mg | ORAL_TABLET | Freq: Every evening | ORAL | Status: DC | PRN
  Administered 2017-06-09 – 2017-06-11 (×3): 5 mg via ORAL
  Filled 2017-06-09 (×3): qty 1

## 2017-06-09 MED ORDER — IOPAMIDOL (ISOVUE-370) INJECTION 76%
75.0000 mL | Freq: Once | INTRAVENOUS | Status: AC | PRN
  Administered 2017-06-09: 75 mL via INTRAVENOUS

## 2017-06-09 MED ORDER — LEVOFLOXACIN 750 MG PO TABS
750.0000 mg | ORAL_TABLET | Freq: Every day | ORAL | Status: DC
  Administered 2017-06-10 – 2017-06-12 (×3): 750 mg via ORAL
  Filled 2017-06-09 (×3): qty 1

## 2017-06-09 MED ORDER — ZOLPIDEM TARTRATE 5 MG PO TABS: 10.0000 mg | ORAL_TABLET | Freq: Every evening | ORAL | Status: DC | PRN

## 2017-06-09 MED ORDER — BUDESONIDE 0.25 MG/2ML IN SUSP
0.2500 mg | Freq: Two times a day (BID) | RESPIRATORY_TRACT | Status: DC
  Administered 2017-06-09 – 2017-06-12 (×6): 0.25 mg via RESPIRATORY_TRACT
  Filled 2017-06-09 (×7): qty 2

## 2017-06-09 NOTE — Progress Notes (Signed)
[facility] [place] [facility]   PATIENT NAME: Penni Homanslizabeth Mika    MR#:  [id number]  DATE OF BIRTH:  08/10/1970  SUBJECTIVE: Admitted because of pneumonia.  Said that she is still not feeling well, having lots of cough, shortness of breath.  And she is still wheezing.  We will get a CT chest, will discontinue telemetry, add Pulmicort today.  CHIEF COMPLAINT:   Chief Complaint  Patient presents with  . Shortness of Breath    REVIEW OF SYSTEMS:   ROS CONSTITUTIONAL: No fever, fatigue or weakness.  EYES: No blurred or double vision.  EARS, NOSE, AND THROAT: No tinnitus or ear pain.  RESPIRATORY: Mild cough, shortness of breath.  Marland Kitchen.  CARDIOVASCULAR: No chest pain, orthopnea, edema.  GASTROINTESTINAL: No nausea, vomiting, diarrhea or abdominal pain.  GENITOURINARY: No dysuria, hematuria.  ENDOCRINE: No polyuria, nocturia,  HEMATOLOGY: No anemia, easy bruising or bleeding SKIN: No rash or lesion. MUSCULOSKELETAL: No joint pain or arthritis.   NEUROLOGIC: No tingling, numbness, weakness.  PSYCHIATRY: No anxiety or depression.   DRUG ALLERGIES:   Allergies  Allergen Reactions  . Morphine And Related Itching    VITALS:  Blood pressure 119/68, pulse (!) 52, temperature 97.8 F (36.6 C), temperature source Oral, resp. rate 18, height 5\' 3"  (1.6 m), weight (!) 164.1 kg (361 lb 12.8 oz), SpO2 92 %.  PHYSICAL EXAMINATION:  GENERAL:  46 y.o.-year-old patient lying in the bed with no acute distress.  EYES: Pupils equal, round, reactive to light and accommodation. No scleral icterus. Extraocular muscles intact.  HEENT: Head atraumatic, normocephalic. Oropharynx and nasopharynx clear.  NECK:  Supple, no jugular venous distention. No thyroid enlargement, no tenderness.  LUNGS faint expiratory wheeze bilaterally.   CARDIOVASCULAR: S1, S2 normal. No murmurs, rubs, or gallops.  ABDOMEN: Soft, nontender, nondistended. Bowel sounds present. No organomegaly or mass.   EXTREMITIES: No pedal edema, cyanosis, or clubbing.  NEUROLOGIC: Cranial nerves II through XII are intact. Muscle strength 5/5 in all extremities. Sensation intact. Gait not checked.  PSYCHIATRIC: The patient is alert and oriented x 3.  SKIN: No obvious rash, lesion, or ulcer.    LABORATORY PANEL:   CBC Recent Labs  Lab 06/08/17 0335  WBC 7.6  HGB 13.0  HCT 39.7  PLT 223   ------------------------------------------------------------------------------------------------------------------  Chemistries  Recent Labs  Lab 06/08/17 0335  NA 136  K 3.8  CL 104  CO2 23  GLUCOSE 153*  BUN 13  CREATININE 0.77  CALCIUM 8.7*   ------------------------------------------------------------------------------------------------------------------  Cardiac Enzymes Recent Labs  Lab 06/07/17 0640  TROPONINI <0.03   ------------------------------------------------------------------------------------------------------------------  RADIOLOGY:  No results found.  EKG:   Orders placed or performed during the hospital encounter of 06/07/17  . ED EKG  . ED EKG  . ED EKG  . ED EKG    ASSESSMENT AND PLAN:   #1 .community-acquired pneumonia causing shortness of breath,.  Continue Levaquin, bronchodilators, steroids. 2.  History of smoking now quit. 3.  Morbid obesity: Patient has a history of gastric bypass surgery, patient is in the process of getting insurance approval, possible duodenal [place]. 4.  History of depression, anxiety: Continue Lamictal, venlafaxine.wanted ambien for sleep. 5 elevated blood pressure without history of hypertension: Added amlodipine.  Blood pressure is better.  #6 history of incisional hernia after gallbladder operation. 7.  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 8. PC OS; #9 because of persistent cough, shortness of breath,  wheezing I ordered CT of the chest contrast to evaluate for PE and also  structural diseases.  Depending on CT results will get pulmonary involved.  Add Pulmicort nebs.   All the records are reviewed a.nd case discussed with Care Management/Social Workerr. Management plans discussed with the patient, family and they are in agreement.  CODE STATUS:full  TOTAL TIME TAKING CARE OF THIS PATIENT: 35minutes.   POSSIBLE D/C IN 1-2 DAYS, DEPENDING ON CLINICAL CONDITION.   Katha HammingSnehalatha Lexi Conaty M.D on 06/09/2017 at 11:36 AM  Between 7am to 6pm - Pager - [phone]  After 6pm go to [email or web address] - password [email or web address] [facility]  [facility]  Office  [phone]  CC: Primary care physician; Lynnea FerrierKlein, Bert J III, MD   Note: This dictation was prepared with Dragon dictation along with smaller phrase technology. Any transcriptional errors that result from this process are unintentional.

## 2017-06-09 NOTE — Progress Notes (Signed)
46 yo female ordered zolpidem 10mg  QHs PRN. Per policy, will transition patient to 5mg  Qhs PRN.   MLS  12.28.2018 1120

## 2017-06-09 NOTE — Progress Notes (Signed)
Per MD okay for RN to discontinue telemetry monitoring.

## 2017-06-09 NOTE — Consult Note (Signed)
Pharmacy Antibiotic Note  Linda Ortiz is a 46 y.o. female admitted on 06/07/2017 with pneumonia.  Pharmacy has been consulted for levofloxacin dosing.  Plan: Continue Levofloxacin 750 mg every 24 hours. Will change to PO per P&T criteria.  Height: 5\' 3"  (160 cm) Weight: (!) 361 lb 12.8 oz (164.1 kg) IBW/kg (Calculated) : 52.4  Temp (24hrs), Avg:97.8 F (36.6 C), Min:97.7 F (36.5 C), Max:97.8 F (36.6 C)  Recent Labs  Lab 06/07/17 0640 06/08/17 0335  WBC 8.4 7.6  CREATININE 0.85 0.77    Estimated Creatinine Clearance: 137.6 mL/min (by C-G formula based on SCr of 0.77 mg/dL).    Allergies  Allergen Reactions  . Morphine And Related Itching    Antimicrobials this admission: 12/26 levofloxacin >>   Dose adjustments this admission:  Microbiology results: 12/26 BCx: No growth 2 days  Thank you for allowing pharmacy to be a part of this patient's care.  Cindi CarbonMary M Symone Cornman, PharmD, BCPS Clinical Pharmacist 06/09/2017 11:34 AM

## 2017-06-10 LAB — GLUCOSE, CAPILLARY: GLUCOSE-CAPILLARY: 123 mg/dL — AB (ref 65–99)

## 2017-06-10 MED ORDER — GUAIFENESIN ER 600 MG PO TB12
600.0000 mg | ORAL_TABLET | Freq: Two times a day (BID) | ORAL | Status: DC
  Administered 2017-06-10 – 2017-06-12 (×5): 600 mg via ORAL
  Filled 2017-06-10 (×5): qty 1

## 2017-06-10 MED ORDER — FLUCONAZOLE 100 MG PO TABS
100.0000 mg | ORAL_TABLET | Freq: Every day | ORAL | Status: DC
  Administered 2017-06-10 – 2017-06-11 (×2): 100 mg via ORAL
  Filled 2017-06-10 (×2): qty 1

## 2017-06-10 NOTE — Plan of Care (Signed)
Pt with improving symptoms. PRN medication for cough. Ambulating independently.

## 2017-06-10 NOTE — Progress Notes (Signed)
[facility] - [place] at [facility]   PATIENT NAME: Linda Ortiz    MR#:  [id number]  DATE OF BIRTH:  11/23/1970  SUBJECTIVE: says  Pulmicort helping her, still feels slightly short of breath and wheezy at times.  Has cough, but able to get out thick yellow sputum.  CHIEF COMPLAINT:   Chief Complaint  Patient presents with  . Shortness of Breath    REVIEW OF SYSTEMS:   ROS CONSTITUTIONAL: No fever, fatigue or weakness.  EYES: No blurred or double vision.  EARS, NOSE, AND THROAT: No tinnitus or ear pain.  RESPIRATORY: Mild cough, shortness of breath.  Marland Kitchen.  CARDIOVASCULAR: No chest pain, orthopnea, edema.  GASTROINTESTINAL: No nausea, vomiting, diarrhea or abdominal pain.  GENITOURINARY: No dysuria, hematuria.  ENDOCRINE: No polyuria, nocturia,  HEMATOLOGY: No anemia, easy bruising or bleeding SKIN: No rash or lesion. MUSCULOSKELETAL: No joint pain or arthritis.   NEUROLOGIC: No tingling, numbness, weakness.  PSYCHIATRY: No anxiety or depression.   DRUG ALLERGIES:   Allergies  Allergen Reactions  . Morphine And Related Itching    VITALS:  Blood pressure 124/61, pulse 74, temperature 97.7 F (36.5 C), temperature source Oral, resp. rate 20, height 5\' 3"  (1.6 m), weight (!) 174.3 kg (384 lb 4.8 oz), SpO2 95 %.  PHYSICAL EXAMINATION:  GENERAL:  46 y.o.-year-old patient lying in the bed with no acute distress.  EYES: Pupils equal, round, reactive to light and accommodation. No scleral icterus. Extraocular muscles intact.  HEENT: Head atraumatic, normocephalic. Oropharynx and nasopharynx clear.  NECK:  Supple, no jugular venous distention. No thyroid enlargement, no tenderness.  LUNGS faint expiratory wheeze bilaterally.   CARDIOVASCULAR: S1, S2 normal. No murmurs, rubs, or gallops.  ABDOMEN: Soft, nontender, nondistended. Bowel sounds present. No organomegaly or mass.  EXTREMITIES: No pedal edema, cyanosis, or clubbing.  NEUROLOGIC: Cranial nerves  II through XII are intact. Muscle strength 5/5 in all extremities. Sensation intact. Gait not checked.  PSYCHIATRIC: The patient is alert and oriented x 3.  SKIN: No obvious rash, lesion, or ulcer.    LABORATORY PANEL:   CBC Recent Labs  Lab 06/08/17 0335  WBC 7.6  HGB 13.0  HCT 39.7  PLT 223   ------------------------------------------------------------------------------------------------------------------  Chemistries  Recent Labs  Lab 06/08/17 0335  NA 136  K 3.8  CL 104  CO2 23  GLUCOSE 153*  BUN 13  CREATININE 0.77  CALCIUM 8.7*   ------------------------------------------------------------------------------------------------------------------  Cardiac Enzymes Recent Labs  Lab 06/07/17 0640  TROPONINI <0.03   ------------------------------------------------------------------------------------------------------------------  RADIOLOGY:  Ct Chest W Contrast  Result Date: 06/09/2017 CLINICAL DATA:  Persistent cough and shortness of breath. EXAM: CT CHEST WITH CONTRAST TECHNIQUE: Multidetector CT imaging of the chest was performed during intravenous contrast administration. CONTRAST:  75mL ISOVUE-370 IOPAMIDOL (ISOVUE-370) INJECTION 76% COMPARISON:  None. FINDINGS: Cardiovascular: Heart size upper normal. No pericardial effusion. No thoracic aortic aneurysm. This exam was not performed as a pulmonary artery CTA which limits assessment for pulmonary embolus. There is no large central pulmonary embolus in the main pulmonary outflow tract or either main pulmonary artery. No evidence for embolus in the lobar pulmonary arteries. Segmental and subsegmental pulmonary arteries are not reliably evaluated. Mediastinum/Nodes: No mediastinal lymphadenopathy. There is no hilar lymphadenopathy. The esophagus has normal imaging features. There is no axillary lymphadenopathy. Lungs/Pleura: 4 mm right upper lobe pulmonary nodule seen image 46 series 3. 3 mm right upper lobe nodule is seen  on image 68. 3 mm right middle lobe nodule is  seen on image 75. 6 mm right lower lobe nodule is seen on image 86. 3 mm left upper lobe nodule is seen on image 29. 4 mm lingular nodule is seen on image 66. No focal airspace consolidation. No pulmonary edema or pleural effusion. Upper Abdomen: Surgical changes noted in the stomach. The liver shows diffusely decreased attenuation suggesting steatosis. Musculoskeletal: Bone windows reveal no worrisome lytic or sclerotic osseous lesions. IMPRESSION: 1. No evidence for focal airspace consolidation. 2. Scattered bilateral pulmonary nodules measuring up to a maximum of 6 mm. Non-contrast chest CT at 3-6 months is recommended. If the nodules are stable at time of repeat CT, then future CT at 18-24 months (from today's scan) is considered optional for low-risk patients, but is recommended for high-risk patients. This recommendation follows the consensus statement: Guidelines for Management of Incidental Pulmonary Nodules Detected on CT Images: From the Fleischner Society 2017; Radiology 2017; 284:228-243. 3. Hepatic steatosis. Electronically Signed   By: Kennith CenterEric  Mansell M.D.   On: 06/09/2017 20:44    EKG:   Orders placed or performed during the hospital encounter of 06/07/17  . ED EKG  . ED EKG  . ED EKG  . ED EKG    ASSESSMENT AND PLAN:   #1 .community-acquired pneumonia causing shortness of breath,.  No furtherpneumonia on CT chest.  However still has cough, wheezing, shortness of breath, P Pulmicort nebulizer as she said she is getting better with Pulmicort nebulizers.  Add Mucinex for mucolytic effect watch for another 24 hours to see how she feels.   Pulmonary nodules on CT chest, patient needs another CT chest in 3-6 months as an outpatient, discussed the results of CT chest with the patient.  2.  History of smoking now quit.   3.  Morbid obesity: Patient has a history of gastric bypass surgery, patient is in the process of getting insurance approval,  possible duodenal [place]ariatric [place].  4.  History of depression, anxiety: Continue Lamictal, venlafaxine.   continue Ambien as needed for insomnia.  5 elevated blood pressure without history of hypertension: Added amlodipine.  Blood pressure is better.   #6 history of incisional hernia after gallbladder operation.  7.  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 8. PC OS;    All the records are reviewed a.nd case discussed with Care Management/Social Workerr. Management plans discussed with the patient, family and they are in agreement.  CODE STATUS:full  TOTAL TIME TAKING CARE OF THIS PATIENT: 35minutes.   POSSIBLE D/C IN 1-2 DAYS, DEPENDING ON CLINICAL CONDITION.   Katha HammingSnehalatha Kayelynn Abdou M.D on 06/10/2017 at 10:17 AM  Between 7am to 6pm - Pager - [phone]  After 6pm go to [email or web address] - password [email or web address] [facility]  [facility]  Office  [phone]  CC: Primary care physician; Lynnea FerrierKlein, Bert J III, MD   Note: This dictation was prepared with Dragon dictation along with smaller phrase technology. Any transcriptional errors that result from this process are unintentional.

## 2017-06-11 LAB — GLUCOSE, CAPILLARY: GLUCOSE-CAPILLARY: 107 mg/dL — AB (ref 65–99)

## 2017-06-11 MED ORDER — PREDNISONE 50 MG PO TABS
50.0000 mg | ORAL_TABLET | Freq: Once | ORAL | Status: AC
  Administered 2017-06-11: 50 mg via ORAL
  Filled 2017-06-11: qty 1

## 2017-06-11 MED ORDER — PREDNISONE 50 MG PO TABS
50.0000 mg | ORAL_TABLET | Freq: Every day | ORAL | Status: DC
  Administered 2017-06-12: 50 mg via ORAL
  Filled 2017-06-11: qty 1

## 2017-06-11 NOTE — Progress Notes (Signed)
[facility] - [place] at [facility]   PATIENT NAME: Linda Ortiz    MR#:  [id number]  DATE OF BIRTH:  06/10/1971  SUBJECTIVE: says  Pulmicort helping her, still feels slightly short of breath and wheezy at times.  Has cough, but able to get out thick yellow sputum.  CHIEF COMPLAINT:   Chief Complaint  Patient presents with  . Shortness of Breath    REVIEW OF SYSTEMS:   ROS CONSTITUTIONAL: No fever, fatigue or weakness.  EYES: No blurred or double vision.  EARS, NOSE, AND THROAT: No tinnitus or ear pain.  RESPIRATORY: Mild cough, shortness of breath.  Marland Kitchen.  CARDIOVASCULAR: No chest pain, orthopnea, edema.  GASTROINTESTINAL: No nausea, vomiting, diarrhea or abdominal pain.  GENITOURINARY: No dysuria, hematuria.  ENDOCRINE: No polyuria, nocturia,  HEMATOLOGY: No anemia, easy bruising or bleeding SKIN: No rash or lesion. MUSCULOSKELETAL: No joint pain or arthritis.   NEUROLOGIC: No tingling, numbness, weakness.  PSYCHIATRY: No anxiety or depression.   DRUG ALLERGIES:   Allergies  Allergen Reactions  . Morphine And Related Itching    VITALS:  Blood pressure 128/63, pulse (!) 58, temperature 98.3 F (36.8 C), resp. rate 20, height 5\' 3"  (1.6 m), weight (!) 172.5 kg (380 lb 4.8 oz), SpO2 97 %.  PHYSICAL EXAMINATION:  GENERAL:  46 y.o.-year-old patient lying in the bed with no acute distress.  EYES: Pupils equal, round, reactive to light and accommodation. No scleral icterus. Extraocular muscles intact.  HEENT: Head atraumatic, normocephalic. Oropharynx and nasopharynx clear.  NECK:  Supple, no jugular venous distention. No thyroid enlargement, no tenderness.  LUNGS faint expiratory wheeze bilaterally.   CARDIOVASCULAR: S1, S2 normal. No murmurs, rubs, or gallops.  ABDOMEN: Soft, nontender, nondistended. Bowel sounds present. No organomegaly or mass.  EXTREMITIES: No pedal edema, cyanosis, or clubbing.  NEUROLOGIC: Cranial nerves II through XII are  intact. Muscle strength 5/5 in all extremities. Sensation intact. Gait not checked.  PSYCHIATRIC: The patient is alert and oriented x 3.  SKIN: No obvious rash, lesion, or ulcer.    LABORATORY PANEL:   CBC Recent Labs  Lab 06/08/17 0335  WBC 7.6  HGB 13.0  HCT 39.7  PLT 223   ------------------------------------------------------------------------------------------------------------------  Chemistries  Recent Labs  Lab 06/08/17 0335  NA 136  K 3.8  CL 104  CO2 23  GLUCOSE 153*  BUN 13  CREATININE 0.77  CALCIUM 8.7*   ------------------------------------------------------------------------------------------------------------------  Cardiac Enzymes Recent Labs  Lab 06/07/17 0640  TROPONINI <0.03   ------------------------------------------------------------------------------------------------------------------  RADIOLOGY:  Ct Chest W Contrast  Result Date: 06/09/2017 CLINICAL DATA:  Persistent cough and shortness of breath. EXAM: CT CHEST WITH CONTRAST TECHNIQUE: Multidetector CT imaging of the chest was performed during intravenous contrast administration. CONTRAST:  75mL ISOVUE-370 IOPAMIDOL (ISOVUE-370) INJECTION 76% COMPARISON:  None. FINDINGS: Cardiovascular: Heart size upper normal. No pericardial effusion. No thoracic aortic aneurysm. This exam was not performed as a pulmonary artery CTA which limits assessment for pulmonary embolus. There is no large central pulmonary embolus in the main pulmonary outflow tract or either main pulmonary artery. No evidence for embolus in the lobar pulmonary arteries. Segmental and subsegmental pulmonary arteries are not reliably evaluated. Mediastinum/Nodes: No mediastinal lymphadenopathy. There is no hilar lymphadenopathy. The esophagus has normal imaging features. There is no axillary lymphadenopathy. Lungs/Pleura: 4 mm right upper lobe pulmonary nodule seen image 46 series 3. 3 mm right upper lobe nodule is seen on image 68. 3 mm  right middle lobe nodule is seen on  image 75. 6 mm right lower lobe nodule is seen on image 86. 3 mm left upper lobe nodule is seen on image 29. 4 mm lingular nodule is seen on image 66. No focal airspace consolidation. No pulmonary edema or pleural effusion. Upper Abdomen: Surgical changes noted in the stomach. The liver shows diffusely decreased attenuation suggesting steatosis. Musculoskeletal: Bone windows reveal no worrisome lytic or sclerotic osseous lesions. IMPRESSION: 1. No evidence for focal airspace consolidation. 2. Scattered bilateral pulmonary nodules measuring up to a maximum of 6 mm. Non-contrast chest CT at 3-6 months is recommended. If the nodules are stable at time of repeat CT, then future CT at 18-24 months (from today's scan) is considered optional for low-risk patients, but is recommended for high-risk patients. This recommendation follows the consensus statement: Guidelines for Management of Incidental Pulmonary Nodules Detected on CT Images: From the Fleischner Society 2017; Radiology 2017; 284:228-243. 3. Hepatic steatosis. Electronically Signed   By: Kennith CenterEric  Mansell M.D.   On: 06/09/2017 20:44    EKG:   Orders placed or performed during the hospital encounter of 06/07/17  . ED EKG  . ED EKG  . ED EKG  . ED EKG    ASSESSMENT AND PLAN:   #1 .community-acquired pneumonia causing shortness of breath,.  No furtherpneumonia on CT chest.  Likely discharge home tomorrow with Pulmicort nebulizer, Solu-Medrol to prednisone today, discharge tomorrow.  Pulmonary nodules on CT chest, patient needs another CT chest in 3-6 months as an outpatient, discussed the results of CT chest with the patient.  2.  History of smoking now quit.   3.  Morbid obesity: Patient has a history of gastric bypass surgery, patient is in the process of getting insurance approval, possible duodenal switch [place]ariat[place].  4.  History of depression, anxiety: Continue Lamictal,  venlafaxine.   continue Ambien as needed for insomnia.  5. elevated blood pressure without history of hypertension: Added amlodipine.  Blood pressure is better.   #6 history of incisional hernia after gallbladder operation.  7.  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 8. PC OS; D/w mother at bedside.  8,vaginal candida;finisihed treatment,one dose diflucan 150 mg  is adequate.d/w iv diflucan. All the records are reviewed a.nd case discussed with Care Management/Social Workerr. Management plans discussed with the patient, family and they are in agreement.  CODE STATUS:full  TOTAL TIME TAKING CARE OF THIS PATIENT: 35minutes.   POSSIBLE D/C IN 1-2 DAYS, DEPENDING ON CLINICAL CONDITION.   Katha HammingSnehalatha Stephanee Barcomb M.D on 06/11/2017 at 1:12 PM  Between 7am to 6pm - Pager - [phone]  After 6pm go to [email or web address] - password [email or web address] [facility]  [facility]  Office  [phone]  CC: Primary care physician; Lynnea FerrierKlein, Bert J III, MD   Note: This dictation was prepared with Dragon dictation along with smaller phrase technology. Any transcriptional errors that result from this process are unintentional.

## 2017-06-11 NOTE — Progress Notes (Signed)
[facility] - [place] at [facility]   PATIENT NAME: Linda Ortiz    MR#:  [id number]  DATE OF BIRTH:  03/18/1971  SUBJECTIVE: says  Pulmicort helping her, still feels slightly short of breath and wheezy at times.  Has cough, but able to get out thick yellow sputum.  CHIEF COMPLAINT:   Chief Complaint  Patient presents with  . Shortness of Breath    REVIEW OF SYSTEMS:   ROS CONSTITUTIONAL: No fever, fatigue or weakness.  EYES: No blurred or double vision.  EARS, NOSE, AND THROAT: No tinnitus or ear pain.  RESPIRATORY: Mild cough, shortness of breath.  Marland Kitchen.  CARDIOVASCULAR: No chest pain, orthopnea, edema.  GASTROINTESTINAL: No nausea, vomiting, diarrhea or abdominal pain.  GENITOURINARY: No dysuria, hematuria.  ENDOCRINE: No polyuria, nocturia,  HEMATOLOGY: No anemia, easy bruising or bleeding SKIN: No rash or lesion. MUSCULOSKELETAL: No joint pain or arthritis.   NEUROLOGIC: No tingling, numbness, weakness.  PSYCHIATRY: No anxiety or depression.   DRUG ALLERGIES:   Allergies  Allergen Reactions  . Morphine And Related Itching    VITALS:  Blood pressure (!) 102/58, pulse 65, temperature 97.6 F (36.4 C), temperature source Oral, resp. rate 20, height 5\' 3"  (1.6 m), weight (!) 172.5 kg (380 lb 4.8 oz), SpO2 98 %.  PHYSICAL EXAMINATION:  GENERAL:  46 y.o.-year-old patient lying in the bed with no acute distress.  EYES: Pupils equal, round, reactive to light and accommodation. No scleral icterus. Extraocular muscles intact.  HEENT: Head atraumatic, normocephalic. Oropharynx and nasopharynx clear.  NECK:  Supple, no jugular venous distention. No thyroid enlargement, no tenderness.  LUNGS faint expiratory wheeze bilaterally.   CARDIOVASCULAR: S1, S2 normal. No murmurs, rubs, or gallops.  ABDOMEN: Soft, nontender, nondistended. Bowel sounds present. No organomegaly or mass.  EXTREMITIES: No pedal edema, cyanosis, or clubbing.  NEUROLOGIC: Cranial  nerves II through XII are intact. Muscle strength 5/5 in all extremities. Sensation intact. Gait not checked.  PSYCHIATRIC: The patient is alert and oriented x 3.  SKIN: No obvious rash, lesion, or ulcer.    LABORATORY PANEL:   CBC Recent Labs  Lab 06/08/17 0335  WBC 7.6  HGB 13.0  HCT 39.7  PLT 223   ------------------------------------------------------------------------------------------------------------------  Chemistries  Recent Labs  Lab 06/08/17 0335  NA 136  K 3.8  CL 104  CO2 23  GLUCOSE 153*  BUN 13  CREATININE 0.77  CALCIUM 8.7*   ------------------------------------------------------------------------------------------------------------------  Cardiac Enzymes Recent Labs  Lab 06/07/17 0640  TROPONINI <0.03   ------------------------------------------------------------------------------------------------------------------  RADIOLOGY:  Ct Chest W Contrast  Result Date: 06/09/2017 CLINICAL DATA:  Persistent cough and shortness of breath. EXAM: CT CHEST WITH CONTRAST TECHNIQUE: Multidetector CT imaging of the chest was performed during intravenous contrast administration. CONTRAST:  75mL ISOVUE-370 IOPAMIDOL (ISOVUE-370) INJECTION 76% COMPARISON:  None. FINDINGS: Cardiovascular: Heart size upper normal. No pericardial effusion. No thoracic aortic aneurysm. This exam was not performed as a pulmonary artery CTA which limits assessment for pulmonary embolus. There is no large central pulmonary embolus in the main pulmonary outflow tract or either main pulmonary artery. No evidence for embolus in the lobar pulmonary arteries. Segmental and subsegmental pulmonary arteries are not reliably evaluated. Mediastinum/Nodes: No mediastinal lymphadenopathy. There is no hilar lymphadenopathy. The esophagus has normal imaging features. There is no axillary lymphadenopathy. Lungs/Pleura: 4 mm right upper lobe pulmonary nodule seen image 46 series 3. 3 mm right upper lobe nodule  is seen on image 68. 3 mm right middle lobe nodule  is seen on image 75. 6 mm right lower lobe nodule is seen on image 86. 3 mm left upper lobe nodule is seen on image 29. 4 mm lingular nodule is seen on image 66. No focal airspace consolidation. No pulmonary edema or pleural effusion. Upper Abdomen: Surgical changes noted in the stomach. The liver shows diffusely decreased attenuation suggesting steatosis. Musculoskeletal: Bone windows reveal no worrisome lytic or sclerotic osseous lesions. IMPRESSION: 1. No evidence for focal airspace consolidation. 2. Scattered bilateral pulmonary nodules measuring up to a maximum of 6 mm. Non-contrast chest CT at 3-6 months is recommended. If the nodules are stable at time of repeat CT, then future CT at 18-24 months (from today's scan) is considered optional for low-risk patients, but is recommended for high-risk patients. This recommendation follows the consensus statement: Guidelines for Management of Incidental Pulmonary Nodules Detected on CT Images: From the Fleischner Society 2017; Radiology 2017; 284:228-243. 3. Hepatic steatosis. Electronically Signed   By: Kennith CenterEric  Mansell M.D.   On: 06/09/2017 20:44    EKG:   Orders placed or performed during the hospital encounter of 06/07/17  . ED EKG  . ED EKG  . ED EKG  . ED EKG    ASSESSMENT AND PLAN:   #1 .community-acquired pneumonia causing shortness of breath,.  No furtherpneumonia on CT chest.  Likely discharge home tomorrow with Pulmicort nebulizer, Solu-Medrol to prednisone today, discharge tomorrow.  Pulmonary nodules on CT chest, patient needs another CT chest in 3-6 months as an outpatient, discussed the results of CT chest with the patient.  2.  History of smoking now quit.   3.  Morbid obesity: Patient has a history of gastric bypass surgery, patient is in the process of getting insurance approval, possible [place]odenal [place].  4.  History of depression, anxiety:  Continue Lamictal, venlafaxine.   continue Ambien as needed for insomnia.  5. elevated blood pressure without history of hypertension: Added amlodipine.  Blood pressure is better.   #6 history of incisional hernia after gallbladder operation.  7.  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 8. PC OS; D/w mother at bedside.   All the records are reviewed a.nd case discussed with Care Management/Social Workerr. Management plans discussed with the patient, family and they are in agreement.  CODE STATUS:full  TOTAL TIME TAKING CARE OF THIS PATIENT: 35minutes.   POSSIBLE D/C IN 1-2 DAYS, DEPENDING ON CLINICAL CONDITION.   Katha HammingSnehalatha Ronnell Clinger M.D on 06/11/2017 at 12:16 PM  Between 7am to 6pm - Pager - [phone]  After 6pm go to [email or web address] - password [email or web address] [facility]  [facility]  Office  [phone]  CC: Primary care physician; Lynnea FerrierKlein, Bert J III, MD   Note: This dictation was prepared with Dragon dictation along with smaller phrase technology. Any transcriptional errors that result from this process are unintentional.

## 2017-06-12 LAB — GLUCOSE, CAPILLARY: Glucose-Capillary: 94 mg/dL (ref 65–99)

## 2017-06-12 LAB — CULTURE, BLOOD (ROUTINE X 2)
CULTURE: NO GROWTH
Culture: NO GROWTH
SPECIMEN DESCRIPTION: ADEQUATE
SPECIMEN DESCRIPTION: ADEQUATE

## 2017-06-12 MED ORDER — IPRATROPIUM-ALBUTEROL 0.5-2.5 (3) MG/3ML IN SOLN: 3.0000 mL | Freq: Four times a day (QID) | RESPIRATORY_TRACT | 30 refills | Status: DC

## 2017-06-12 MED ORDER — PREDNISONE 10 MG (21) PO TBPK: ORAL_TABLET | ORAL | 0 refills | Status: DC

## 2017-06-12 MED ORDER — AMLODIPINE BESYLATE 5 MG PO TABS: 5.0000 mg | ORAL_TABLET | Freq: Every day | ORAL | 0 refills | Status: DC

## 2017-06-12 MED ORDER — ALBUTEROL SULFATE HFA 108 (90 BASE) MCG/ACT IN AERS: 2.0000 | INHALATION_SPRAY | Freq: Four times a day (QID) | RESPIRATORY_TRACT | 2 refills | Status: DC | PRN

## 2017-06-12 MED ORDER — BUDESONIDE 0.25 MG/2ML IN SUSP: 0.2500 mg | Freq: Two times a day (BID) | RESPIRATORY_TRACT | 12 refills | Status: DC

## 2017-06-14 NOTE — Discharge Summary (Signed)
Linda Ortiz, is a 47 y.o. female  DOB 09/27/1970  MRN [id number].  Admission date:  06/07/2017  Admitting Physician  Katha HammingSnehalatha Arnol Mcgibbon, MD  Discharge Date:  06/14/2017   Primary MD  Lynnea FerrierKlein, Bert J III, MD  Recommendations for primary care physician for things to follow:   Follow up with PCP in 1 week  Admission Diagnosis  Dyspnea, unspecified type [R06.00] Community acquired pneumonia of left lower lobe of lung (HCC) [J18.1]   Discharge Diagnosis  Dyspnea, unspecified type [R06.00] Community acquired pneumonia of left lower lobe of lung (HCC) [J18.1]    Active Problems:   Pneumonia      Past Medical History:  Diagnosis Date  . Anxiety   . Complication of anesthesia   . Diverticulitis   . Dysmenorrhea   . Endometriosis   . Hematoma of right lower extremity   . Menorrhagia   . Morbid obesity (HCC)   . Pancreatitis, acute   . PCOS (polycystic ovarian syndrome)   . Pneumonia   . PONV (postoperative nausea and vomiting)    General Anesthesia  . Rib fracture    Left     Past Surgical History:  Procedure Laterality Date  . APPENDECTOMY  2005  . ARTHROSCOPY KNEE W/ DRILLING    . BARIATRIC SURGERY  2007  . CHOLECYSTECTOMY N/A 10/19/2015   Procedure: LAPAROSCOPIC CHOLECYSTECTOMY ;  Surgeon: Leafy Roiego F Pabon, MD;  Location: [facility] ORS;  Service: General;  Laterality: N/A;  . COLONOSCOPY WITH PROPOFOL N/A 06/03/2016   Procedure: COLONOSCOPY WITH PROPOFOL;  Surgeon: Christena DeemMartin U Skulskie, MD;  [facility];  Service: Endoscopy;  Laterality: N/A;  . ESOPHAGOGASTRODUODENOSCOPY (EGD) WITH PROPOFOL N/A 06/03/2016   Procedure: ESOPHAGOGASTRODUODENOSCOPY (EGD) WITH PROPOFOL;  Surgeon: Christena DeemMartin U Skulskie, MD;  [facility];  Service: Endoscopy;  Laterality: N/A;  . EYE SURGERY  2017       History of  present illness and  Hospital Course:     Kindly see H&P for history of present illness and admission details, please review complete Labs, Consult reports and Test reports for all details in brief  HPI  from the history and physical done on the day of admission 47 year old female patient with anxiety, morbid obesity came in because of shortness of breath for 5 days associated with low-grade temperature, cough.  X-ray was concerning for pneumonia so she is admitted for acute respiratory failure with pneumonia.   Hospital Course  1 acute respiratory failure secondary to community-acquired pneumonia.  Patient labs largely within normal limits but patient had hypoxia with O2 sat dropping to 90% on exertion on room air when she came associated with tachycardia, wheezing so she is admitted .  She received Levaquin for 6 days., IV Solu-Medrol, bronchodilators every 4 hours.  Add incentive spirometry.  It took at least 4 days for her to get better with wheezing without getting short of breath on exertion.  Patient also received Pulmicort nebulizer to decrease her wheezing.  Also received vaginal candidiasis.  At the time of discharge she did not have any further wheezing and felt good without cough.  Discharge her home with nebulizer for Pulmicort, albuterol, she did not require any antibiotics at discharge. #2.  Morbid obesity: Patient has a history of gastric bypass surgery, patient is in the process of getting insurance approval, possible duodenal [place]. 4.  History of depression, anxiety: Continue Lamictal, venlafaxine. 5 elevated blood pressure without history of hypertension: Added amlodipine. #6 history  of incisional hernia after gallbladder operation. 7.  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 8. PC OS     Discharge Condition: stable   Follow UP  Follow-up Information    Lynnea Ferrier, MD. Go on 06/19/2017.   Specialty:   Internal Medicine Why:  Monday at 8:30am for hospital follow-up Contact information: [address] [facility] [place] [address] [phone]             Discharge Instructions  and  Discharge Medications     Discharge Instructions    Discharge instructions   Complete by:  As directed    Use albuterol nebulizer, Pulmicort nebulizer for the next 3-4 days and then followed by albuterol inhaler every 6 hours as needed for wheezing.     Allergies as of 06/12/2017      Reactions   Morphine And Related Itching      Medication List    STOP taking these medications   medroxyPROGESTERone 5 MG tablet Commonly known as:  PROVERA   metroNIDAZOLE 500 MG tablet Commonly known as:  FLAGYL     TAKE these medications   albuterol 108 (90 Base) MCG/ACT inhaler Commonly known as:  PROVENTIL HFA;VENTOLIN HFA Inhale 2 puffs into the lungs every 6 (six) hours as needed for wheezing or shortness of breath.   amLODipine 5 MG tablet Commonly known as:  NORVASC Take 1 tablet (5 mg total) by mouth daily.   budesonide 0.25 MG/2ML nebulizer solution Commonly known as:  PULMICORT Take 2 mLs (0.25 mg total) by nebulization 2 (two) times daily.   cholecalciferol 1000 units tablet Commonly known as:  VITAMIN D Take 1,000 Units by mouth daily.   ciprofloxacin 500 MG tablet Commonly known as:  CIPRO Take 1 tablet (500 mg total) by mouth 2 (two) times daily.   cyclobenzaprine 10 MG tablet Commonly known as:  FLEXERIL Take 1 tablet (10 mg total) by mouth 3 (three) times daily as needed for muscle spasms.   fluconazole 150 MG tablet Commonly known as:  DIFLUCAN Take 1 tablet (150 mg total) by mouth daily.   ipratropium-albuterol 0.5-2.5 (3) MG/3ML Soln Commonly known as:  DUONEB Take 3 mLs by nebulization every 6 (six) hours.   lamoTRIgine 200 MG tablet Commonly known as:  LAMICTAL Take 200 mg by mouth daily.   LUPRON DEPOT (50-MONTH) 3.75 MG injection Generic  drug:  leuprolide RECONSTITUTE AS DIRECTED. INJECT 3.75 MG INTRAMUSCULARLY ONCE MONTHLY.   naproxen 500 MG tablet Commonly known as:  NAPROSYN Take 1 tablet (500 mg total) by mouth 2 (two) times daily with a meal.   ondansetron 4 MG tablet Commonly known as:  ZOFRAN Take 1 tablet (4 mg total) by mouth every 4 (four) hours as needed for nausea.   predniSONE 10 MG (21) Tbpk tablet Commonly known as:  STERAPRED UNI-PAK 21 TAB TAPER BY 10 MG DAILY   venlafaxine 75 MG tablet Commonly known as:  EFFEXOR Take 1 tablet (75 mg total) by mouth 2 (two) times daily. What changed:    how much to take  when to take this   vitamin B-12 100 MCG tablet Commonly known as:  CYANOCOBALAMIN Take 100 mcg by mouth daily.         Diet and Activity recommendation: See Discharge Instructions above   Consults obtained -none   Major procedures and Radiology Reports - PLEASE review detailed and final reports for all details, in brief -  Ct Chest W Contrast  Result Date: 06/09/2017 CLINICAL DATA:  Persistent cough and shortness of breath. EXAM: CT CHEST WITH CONTRAST TECHNIQUE: Multidetector CT imaging of the chest was performed during intravenous contrast administration. CONTRAST:  75mL ISOVUE-370 IOPAMIDOL (ISOVUE-370) INJECTION 76% COMPARISON:  None. FINDINGS: Cardiovascular: Heart size upper normal. No pericardial effusion. No thoracic aortic aneurysm. This exam was not performed as a pulmonary artery CTA which limits assessment for pulmonary embolus. There is no large central pulmonary embolus in the main pulmonary outflow tract or either main pulmonary artery. No evidence for embolus in the lobar pulmonary arteries. Segmental and subsegmental pulmonary arteries are not reliably evaluated. Mediastinum/Nodes: No mediastinal lymphadenopathy. There is no hilar lymphadenopathy. The esophagus has normal imaging features. There is no axillary lymphadenopathy. Lungs/Pleura: 4 mm right upper lobe  pulmonary nodule seen image 46 series 3. 3 mm right upper lobe nodule is seen on image 68. 3 mm right middle lobe nodule is seen on image 75. 6 mm right lower lobe nodule is seen on image 86. 3 mm left upper lobe nodule is seen on image 29. 4 mm lingular nodule is seen on image 66. No focal airspace consolidation. No pulmonary edema or pleural effusion. Upper Abdomen: Surgical changes noted in the stomach. The liver shows diffusely decreased attenuation suggesting steatosis. Musculoskeletal: Bone windows reveal no worrisome lytic or sclerotic osseous lesions. IMPRESSION: 1. No evidence for focal airspace consolidation. 2. Scattered bilateral pulmonary nodules measuring up to a maximum of 6 mm. Non-contrast chest CT at 3-6 months is recommended. If the nodules are stable at time of repeat CT, then future CT at 18-24 months (from today's scan) is considered optional for low-risk patients, but is recommended for high-risk patients. This recommendation follows the consensus statement: Guidelines for Management of Incidental Pulmonary Nodules Detected on CT Images: From the Fleischner Society 2017; Radiology 2017; 284:228-243. 3. Hepatic steatosis. Electronically Signed   By: Kennith Center M.D.   On: 06/09/2017 20:44   Dg Chest Portable 1 View  Result Date: 06/07/2017 CLINICAL DATA:  Fever, cough, and shortness of breath. EXAM: PORTABLE CHEST 1 VIEW COMPARISON:  None. FINDINGS: Image quality is degraded by patient body habitus. The cardiomediastinal silhouette is within normal limits. There are coarse lung markings in both bases with mild patchy opacity on the right. No sizable pleural effusion or pneumothorax is identified. No acute osseous abnormality is seen. IMPRESSION: Right greater than left basilar lung opacities which may reflect pneumonia or atelectasis. Electronically Signed   By: Sebastian Ache M.D.   On: 06/07/2017 07:17   Mm Screening Breast Tomo Bilateral  Result Date: 05/18/2017 CLINICAL DATA:   Screening. EXAM: 2D DIGITAL SCREENING BILATERAL MAMMOGRAM WITH CAD AND ADJUNCT TOMO COMPARISON:  Previous exam(s). ACR Breast Density Category b: There are scattered areas of fibroglandular density. FINDINGS: There are no findings suspicious for malignancy. Images were processed with CAD. IMPRESSION: No mammographic evidence of malignancy. A result letter of this screening mammogram will be mailed directly to the patient. RECOMMENDATION: Screening mammogram in one year. (Code:SM-B-01Y) BI-RADS CATEGORY  1: Negative. Electronically Signed   By: Elberta Fortis M.D.   On: 05/18/2017 12:46    Micro Results     Recent Results (from the past 240 hour(s))  Blood culture (routine x 2)     Status: None   Collection Time: 06/07/17  8:34 AM  Result Value Ref Range Status   Specimen Description BLOOD Blood Culture adequate volume  Final   Special Requests LEFT ANTECUBITAL  Final   Culture   Final    NO GROWTH 5 DAYS Performed at [facility], [address] [place], [place] [address]    Report Status 06/12/2017 FINAL  Final  Blood culture (routine x 2)     Status: None   Collection Time: 06/07/17  8:34 AM  Result Value Ref Range Status   Specimen Description BLOOD Blood Culture adequate volume  Final   Special Requests RIGHT ANTECUBITAL  Final   Culture   Final    NO GROWTH 5 DAYS Performed at [facility], [address] [place], [place] [address]    Report Status 06/12/2017 FINAL  Final       Today   Subjective:   Haedyn Breau today has no headache,no chest abdominal pain,no new weakness tingling or numbness, feels much better wants to go home today.  Objective:   Blood pressure 139/80, pulse 63, temperature 97.7 F (36.5 C), temperature source Oral, resp. rate 20, height 5\' 3"  (1.6 m), weight (!) 171.9 kg (378 lb 14.4 oz), SpO2 97 %.  No intake or output data in the 24 hours ending 06/14/17 1631  Exam Awake Alert, Oriented x 3, No new F.N deficits, Normal  affect [place].AT,PERRAL Supple Neck,No JVD, No cervical lymphadenopathy appriciated.  Symmetrical Chest wall movement, Good air movement bilaterally, CTAB RRR,No Gallops,Rubs or new Murmurs, No Parasternal Heave +ve B.Sounds, Abd Soft, Non tender, No organomegaly appriciated, No rebound -guarding or rigidity. No Cyanosis, Clubbing or edema, No new Rash or bruise  Data Review   CBC w Diff:  Lab Results  Component Value Date   WBC 7.6 06/08/2017   HGB 13.0 06/08/2017   HCT 39.7 06/08/2017   PLT 223 06/08/2017   LYMPHOPCT 16 12/10/2015   MONOPCT 5 12/10/2015   EOSPCT 2 12/10/2015   BASOPCT 1 12/10/2015    CMP:  Lab Results  Component Value Date   NA 136 06/08/2017   K 3.8 06/08/2017   CL 104 06/08/2017   CO2 23 06/08/2017   BUN 13 06/08/2017   CREATININE 0.77 06/08/2017   PROT 6.8 12/10/2015   ALBUMIN 4.0 12/10/2015   BILITOT 0.3 12/10/2015   ALKPHOS 101 12/10/2015   AST 24 12/10/2015   ALT 27 12/10/2015  .   Total Time in preparing paper work, data evaluation and todays exam - 35 minutes  Katha Hamming M.D on  12/31/2018at 4:31 PM    Note: This dictation was prepared with Dragon dictation along with smaller phrase technology. Any transcriptional errors that result from this process are unintentional.

## 2018-04-27 ENCOUNTER — Other Ambulatory Visit: Payer: Self-pay | Admitting: Internal Medicine

## 2018-04-27 DIAGNOSIS — Z1231 Encounter for screening mammogram for malignant neoplasm of breast: Secondary | ICD-10-CM

## 2018-05-21 ENCOUNTER — Ambulatory Visit
Admission: RE | Admit: 2018-05-21 | Discharge: 2018-05-21 | Disposition: A | Discharge status: Home / Self Care | Payer: BC Managed Care – PPO | Source: Ambulatory Visit | Attending: Internal Medicine | Admitting: Internal Medicine

## 2018-05-21 ENCOUNTER — Encounter: Payer: Self-pay | Admitting: Radiology

## 2018-05-21 DIAGNOSIS — Z1231 Encounter for screening mammogram for malignant neoplasm of breast: Secondary | ICD-10-CM

## 2019-03-22 ENCOUNTER — Other Ambulatory Visit: Payer: Self-pay

## 2019-03-22 DIAGNOSIS — Z20822 Contact with and (suspected) exposure to covid-19: Secondary | ICD-10-CM

## 2019-03-23 LAB — NOVEL CORONAVIRUS, NAA: SARS-CoV-2, NAA: NOT DETECTED

## 2019-05-01 ENCOUNTER — Other Ambulatory Visit: Payer: Self-pay

## 2019-05-01 DIAGNOSIS — Z20822 Contact with and (suspected) exposure to covid-19: Secondary | ICD-10-CM

## 2019-05-03 LAB — NOVEL CORONAVIRUS, NAA: SARS-CoV-2, NAA: NOT DETECTED

## 2019-06-27 ENCOUNTER — Other Ambulatory Visit: Payer: Self-pay | Admitting: Internal Medicine

## 2019-06-27 DIAGNOSIS — Z1231 Encounter for screening mammogram for malignant neoplasm of breast: Secondary | ICD-10-CM

## 2019-06-28 ENCOUNTER — Ambulatory Visit
Admission: RE | Admit: 2019-06-28 | Discharge: 2019-06-28 | Disposition: A | Discharge status: Home / Self Care | Payer: BC Managed Care – PPO | Source: Ambulatory Visit | Attending: Internal Medicine | Admitting: Internal Medicine

## 2019-06-28 DIAGNOSIS — Z1231 Encounter for screening mammogram for malignant neoplasm of breast: Secondary | ICD-10-CM | POA: Diagnosis present

## 2019-07-25 ENCOUNTER — Telehealth: Payer: Self-pay | Admitting: Obstetrics and Gynecology

## 2019-07-25 NOTE — Telephone Encounter (Signed)
Pt called in and stated that she hasn't been seen since 2017 and that she is having issues. I informed the pt that she wouldn't been seen since 2017 and that cherry doesn't have anything till may. The pt said can I ask dr cherry if she could be seen sooner. I told the pt that I could put her on the wait list. But the pt wanted to talk to doctor cherry. Pt is requesting a call back. Please advise

## 2019-07-26 ENCOUNTER — Ambulatory Visit: Payer: BC Managed Care – PPO

## 2019-07-29 NOTE — Telephone Encounter (Signed)
August 20, 2019 at 10am will be okay if not go to the next open [employer] slot and use that one. Thanks [employer]

## 2019-07-29 NOTE — Telephone Encounter (Signed)
Thank you so much I got her on the books.

## 2019-07-29 NOTE — Telephone Encounter (Signed)
It will be a new gyn

## 2019-07-29 NOTE — Telephone Encounter (Signed)
Yes ma'am would it be a new gyn or just a gyn visit?  If its a new gyn please let me know where to put the pt. Thank you

## 2019-07-29 NOTE — Telephone Encounter (Signed)
Yes ma'am, where would you want her on her schu? And ill call the pt.

## 2019-08-20 ENCOUNTER — Other Ambulatory Visit: Payer: Self-pay

## 2019-08-20 ENCOUNTER — Other Ambulatory Visit (INDEPENDENT_AMBULATORY_CARE_PROVIDER_SITE_OTHER): Payer: BC Managed Care – PPO

## 2019-08-20 ENCOUNTER — Ambulatory Visit: Payer: BC Managed Care – PPO | Admitting: Obstetrics and Gynecology

## 2019-08-20 ENCOUNTER — Encounter: Payer: Self-pay | Admitting: Obstetrics and Gynecology

## 2019-08-20 VITALS — BP 160/101 | HR 86 | Ht 63.0 in | Wt 374.0 lb

## 2019-08-20 DIAGNOSIS — E669 Obesity, unspecified: Secondary | ICD-10-CM

## 2019-08-20 DIAGNOSIS — Z6841 Body Mass Index (BMI) 40.0 and over, adult: Secondary | ICD-10-CM | POA: Diagnosis not present

## 2019-08-20 DIAGNOSIS — R102 Pelvic and perineal pain unspecified side: Secondary | ICD-10-CM

## 2019-08-20 DIAGNOSIS — R399 Unspecified symptoms and signs involving the genitourinary system: Secondary | ICD-10-CM | POA: Diagnosis not present

## 2019-08-20 DIAGNOSIS — Z124 Encounter for screening for malignant neoplasm of cervix: Secondary | ICD-10-CM

## 2019-08-20 DIAGNOSIS — N950 Postmenopausal bleeding: Secondary | ICD-10-CM

## 2019-08-20 DIAGNOSIS — N939 Abnormal uterine and vaginal bleeding, unspecified: Secondary | ICD-10-CM | POA: Diagnosis not present

## 2019-08-20 LAB — POCT URINALYSIS DIPSTICK OB
Bilirubin, UA: NEGATIVE
Glucose, UA: NEGATIVE
Ketones, UA: NEGATIVE
Leukocytes, UA: NEGATIVE
Nitrite, UA: NEGATIVE
POC,PROTEIN,UA: NEGATIVE
Spec Grav, UA: >=1.03 — AB (ref 1.010–1.025)
Urobilinogen, UA: 0.2 E.U./dL
pH, UA: 6 (ref 5.0–8.0)

## 2019-08-20 NOTE — Patient Instructions (Signed)
Postmenopausal Bleeding  Postmenopausal bleeding is any bleeding that a woman has after she has entered into menopause. Menopause is the end of a woman's fertile years. After menopause, a woman no longer ovulates and does not have menstrual periods. Postmenopausal bleeding may have various causes, including:  Menopausal hormone therapy (MHT).  Endometrial atrophy. After menopause, low estrogen hormone levels cause the membrane that lines the uterus (endometrium) to become thinner. You may have bleeding as the endometrium thins.  Endometrial hyperplasia. This condition is caused by excess estrogen hormones and low levels of progesterone hormones. The excess estrogen causes the endometrium to thicken, which can lead to bleeding. In some cases, this can lead to cancer of the uterus.  Endometrial cancer.  Non-cancerous growths (polyps) on the endometrium, the lining of the uterus, or the cervix.  Uterine fibroids. These are non-cancerous growths in or around the uterus muscle tissue that can cause heavy bleeding. Any type of postmenopausal bleeding, even if it appears to be a typical menstrual period, should be evaluated by your health care provider. Treatment will depend on the cause of the bleeding. Follow these instructions at home:  Pay attention to any changes in your symptoms.  Avoid using tampons and douches as told by your health care provider.  Change your pads regularly.  Get regular pelvic exams and Pap tests.  Take iron supplements as told by your health care provider.  Take over-the-counter and prescription medicines only as told by your health care provider.  Keep all follow-up visits as told by your health care provider. This is important. Contact a health care provider if:  Your bleeding lasts more than 1 week.  You have abdominal pain.  You have bleeding with or after sexual intercourse.  You have bleeding that happens more often than every 3 weeks. Get help  right away if:  You have a fever, chills, headache, dizziness, muscle aches, and bleeding.  You have severe pain with bleeding.  You are passing blood clots.  You have heavy bleeding, need more than 1 pad an hour, and have never experienced this before.  You feel faint. Summary  Postmenopausal bleeding is any bleeding that a woman has after she has entered into menopause.  Postmenopausal bleeding may have various causes. Treatment will depend on the cause of the bleeding.  Any type of postmenopausal bleeding, even if it appears to be a typical menstrual period, should be evaluated by your health care provider.  Be sure to pay attention to any changes in your symptoms and keep all follow-up visits as told by your health care provider. This information is not intended to replace advice given to you by your health care provider. Make sure you discuss any questions you have with your health care provider. Document Revised: 09/06/2017 Document Reviewed: 08/23/2016 Elsevier Patient Education  2020 Elsevier Inc.  

## 2019-08-20 NOTE — Progress Notes (Signed)
Pt present due to having heavy cycles. Pt stated that she is currently in menopause and it has been over 2 years since her last cycle. Pt stated having 3 days of heavy bleeding with large clots. Cycle lasted 6-8 days. Pt stated having pain in her lower back along with sharp vaginal pains off and on and pain with urination. UA completed and documented. yello

## 2019-08-20 NOTE — Progress Notes (Addendum)
GYNECOLOGY PROGRESS NOTE  Subjective:    Patient ID: Linda Ortiz, female    DOB: 1970-06-30, 49 y.o.   MRN: [id number]  HPI  Patient is a 49 y.o. G0P0000 female with a history of PCOS and endometriosis who presents for complaints of an episode of heavy vaginal bleeding x 3 days with passage of large clots.  She reports that she has not had a cycle in approximately 2 years. She is also noting some pain in her lower back along with sharp vaginal pains off and on, and discomfort with urination.   Of note, patient was last seen at [facility] in 2017, was undergoing treatment with Lupron injections for endometriosis (and menorrhagia). Last pap smear was ~ 2016, was normal per patient.    The following portions of the patient's history were reviewed and updated as appropriate:  She  has a past medical history of Anxiety, Complication of anesthesia, Depression, Diverticulitis, Dysmenorrhea, Endometriosis, Hematoma of right lower extremity, Menorrhagia, Morbid obesity ([place]), Pancreatitis, acute, PCOS (polycystic ovarian syndrome), Pneumonia, PONV (postoperative nausea and vomiting), and Rib fracture.   She  has a past surgical history that includes Bariatric Surgery (2007); Arthroscopy knee w/ drilling; Appendectomy (2005); Cholecystectomy (N/A, 10/19/2015); Eye surgery (2017); Colonoscopy with propofol (N/A, 06/03/2016); and Esophagogastroduodenoscopy (egd) with propofol (N/A, 06/03/2016).   Her family history includes ALS in her paternal grandmother; Alcohol abuse in her maternal grandmother; Arthritis in her mother; Cancer in her father; Hyperlipidemia in her father; Hypertension in her mother; Mental illness in her maternal grandmother.   She  reports that she has been smoking cigarettes. She has a 2.00 pack-year smoking history. She has never used smokeless tobacco. She reports current alcohol use. She reports that she does not use drugs.    Current Outpatient Medications on File Prior to  Visit  Medication Sig Dispense Refill  . cholecalciferol (VITAMIN D) 1000 units tablet Take 1,000 Units by mouth daily.    Marland Kitchen lamoTRIgine (LAMICTAL) 200 MG tablet Take 200 mg by mouth daily.     Marland Kitchen SPIRONOLACTONE PO Take by mouth. Patient unsure about milligrams.    Marland Kitchen venlafaxine (EFFEXOR) 75 MG tablet Take 1 tablet (75 mg total) by mouth 2 (two) times daily. (Patient taking differently: Take 150 mg by mouth daily. ) 60 tablet 3  . vitamin B-12 (CYANOCOBALAMIN) 100 MCG tablet Take 100 mcg by mouth daily.    Marland Kitchen albuterol (PROVENTIL HFA;VENTOLIN HFA) 108 (90 Base) MCG/ACT inhaler Inhale 2 puffs into the lungs every 6 (six) hours as needed for wheezing or shortness of breath. (Patient not taking: Reported on 08/20/2019) 1 Inhaler 2  . amLODipine (NORVASC) 5 MG tablet Take 1 tablet (5 mg total) by mouth daily. (Patient not taking: Reported on 08/20/2019) 30 tablet 0  . budesonide (PULMICORT) 0.25 MG/2ML nebulizer solution Take 2 mLs (0.25 mg total) by nebulization 2 (two) times daily. (Patient not taking: Reported on 08/20/2019) 60 mL 12  . ipratropium-albuterol (DUONEB) 0.5-2.5 (3) MG/3ML SOLN Take 3 mLs by nebulization every 6 (six) hours. (Patient not taking: Reported on 08/20/2019) 3 mL 30  . naproxen (NAPROSYN) 500 MG tablet Take 1 tablet (500 mg total) by mouth 2 (two) times daily with a meal. (Patient not taking: Reported on 06/07/2017) 30 tablet 0   No current facility-administered medications on file prior to visit.   She is allergic to morphine and related..  Review of Systems Pertinent items noted in HPI and remainder of comprehensive ROS otherwise negative.   Objective:  Blood pressure (!) 160/101, pulse 86, height 5\' 3"  (1.6 m), weight (!) 374 lb (169.6 kg), last menstrual period 10/01/2015. Body mass index is 66.25 kg/m.  General appearance: alert, no distress and morbidly obese Abdomen: soft, non-tender; bowel sounds normal; no masses,  no organomegaly Pelvic: external genitalia normal,  rectovaginal septum normal.  Vagina without discharge or blood.  Cervix normal appearing, no lesions and no motion tenderness.  Unable to palpate uterus due to body habitus.  Adnexae non-palpable, nontender bilaterally.  Extremities: extremities normal, atraumatic, no cyanosis or edema Neurologic: Grossly normal   Labs:  Results for orders placed or performed in visit on 08/20/19  POC Urinalysis Dipstick OB  Result Value Ref Range   Color, UA yellow    Clarity, UA clear    Glucose, UA Negative Negative   Bilirubin, UA neg    Ketones, UA neg    Spec Grav, UA >=1.030 (A) 1.010 - 1.025   Blood, UA trace    pH, UA 6.0 5.0 - 8.0   POC,PROTEIN,UA Negative Negative, Trace, Small (1+), Moderate (2+), Large (3+), 4+   Urobilinogen, UA 0.2 0.2 or 1.0 E.U./dL   Nitrite, UA neg    Leukocytes, UA Negative Negative   Appearance yellow;clear    Odor      Assessment:   1. Abnormal uterine bleeding   2. Pelvic pain   3. UTI symptoms   4. Body mass index (BMI) 60.0-69.9, adult (HCC)   5. Cervical cancer screening    Plan:   1. Abnormal uterine bleeding - patient without a menstrual cycle for 2 years with new onset of bleeding. Unsure if patient is experiencing postmenopausal bleeding, or had a period of amenorrhea due to history of PCOS and is still perimenopausal. Will order labs to assess status. Patient is at risk for endometrial hyperplasia and malignancy due to morbid obesity as well as h/o PCOS and endometriosis. Discussed likely need for biopsy.  2. Pelvic ultrasound ordered to assess potential structural causes for abnormal bleeding, as well as complaints of pelvic pain.  3. UTI symptoms present, however UA with no evidence of UTI. Advised on increasing hydration, use of Azo or cranberry juice for symptoms.  4. Pap smear performed today in light of h/o abnormal bleeding and overdue for screening.  5. Will inform patient of all results by phone, and schedule follow-up accordingly.     12-18-1997, MD [facility]

## 2019-08-21 LAB — ESTRADIOL: Estradiol: 54.4 pg/mL

## 2019-08-21 LAB — PROGESTERONE: Progesterone: 0.1 ng/mL

## 2019-08-21 LAB — FSH/LH
FSH: 6.2 m[IU]/mL
LH: 7.9 m[IU]/mL

## 2019-08-28 NOTE — Telephone Encounter (Signed)
Please have patient set up for a visit (in person or televisit) to discuss options.

## 2019-09-02 ENCOUNTER — Telehealth: Payer: Self-pay

## 2019-09-02 NOTE — Telephone Encounter (Signed)
Pt called no answer LM via VM that we had placed her on [facility] schedule for 09/04/2019 at 11:30am for a televisit. Pt was advised if that appointment would not work for her to please call the office to reschedule.

## 2019-09-04 ENCOUNTER — Encounter: Payer: BC Managed Care – PPO | Admitting: Obstetrics and Gynecology

## 2019-09-04 ENCOUNTER — Ambulatory Visit (INDEPENDENT_AMBULATORY_CARE_PROVIDER_SITE_OTHER): Payer: BC Managed Care – PPO | Admitting: Obstetrics and Gynecology

## 2019-09-04 ENCOUNTER — Other Ambulatory Visit: Payer: Self-pay

## 2019-09-04 ENCOUNTER — Encounter: Payer: Self-pay | Admitting: Obstetrics and Gynecology

## 2019-09-04 VITALS — Ht 63.0 in | Wt 374.0 lb

## 2019-09-04 DIAGNOSIS — N809 Endometriosis, unspecified: Secondary | ICD-10-CM | POA: Diagnosis not present

## 2019-09-04 DIAGNOSIS — Z6841 Body Mass Index (BMI) 40.0 and over, adult: Secondary | ICD-10-CM

## 2019-09-04 DIAGNOSIS — E282 Polycystic ovarian syndrome: Secondary | ICD-10-CM

## 2019-09-04 DIAGNOSIS — N926 Irregular menstruation, unspecified: Secondary | ICD-10-CM | POA: Diagnosis not present

## 2019-09-04 MED ORDER — MISOPROSTOL 200 MCG PO TABS: ORAL_TABLET | ORAL | 1 refills | Status: DC

## 2019-09-04 NOTE — Progress Notes (Signed)
Televist-pt's information updated and documented. Pt is having visit to discuss PCOS options and other issues with ovaries and cysts.

## 2019-09-04 NOTE — Patient Instructions (Signed)
Please pick up Cytotec medication one day prior to your procedure. Place tablets vaginally. You can use an applicator if needed (place pills inside device and insert). An applicator can be picked up at the office or you can utilize from other treatments (I.e. yeast medications)    Intrauterine Device Information An intrauterine device (IUD) is a medical device that is inserted in the uterus to prevent pregnancy. It is a small, T-shaped device that has one or two nylon strings hanging down from it. The strings hang out of the lower part of the uterus (cervix) to allow for future IUD removal. There are two types of IUDs available:  Hormone IUD. This type of IUD is made of plastic and contains the hormone progestin (synthetic progesterone). A hormone IUD may last 3-5 years.  Copper IUD. This type of IUD has copper wire wrapped around it. A copper IUD may last up to 10 years. How is an IUD inserted? An IUD is inserted through the vagina and placed into the uterus with a minor medical procedure. The exact procedure for IUD insertion may vary among health care providers and hospitals. How does an IUD work? Synthetic progesterone in a hormonal IUD prevents pregnancy by:  Thickening cervical mucus to prevent sperm from entering the uterus.  Thinning the uterine lining to prevent a fertilized egg from being implanted there. Copper in a copper IUD prevents pregnancy by making the uterus and fallopian tubes produce a fluid that kills sperm. What are the advantages of an IUD? Advantages of either type of IUD  It is highly effective in preventing pregnancy.  It is reversible. You can become pregnant shortly after the IUD is removed.  It is low-maintenance and can stay in place for a long time.  There are no estrogen-related side effects.  It can be used when breastfeeding.  It is not associated with weight gain.  It can be inserted right after childbirth, an abortion, or a  miscarriage. Advantages of a hormone IUD  If it is inserted within 7 days of your period starting, it works right after it is inserted. If the hormone IUD is inserted at any other time in your cycle, you will need to use a backup method of birth control for 7 days after insertion.  It can make menstrual periods lighter.  It can reduce menstrual cramping.  It can be used for 3-5 years. Advantages of a copper IUD  It works right after it is inserted.  It can be used as a form of emergency birth control if it is inserted within 5 days after having unprotected sex.  It does not interfere with your body's natural hormones.  It can be used for 10 years. What are the disadvantages of an IUD?  An IUD may cause irregular menstrual bleeding for a period of time after insertion.  You may have pain during insertion and have cramping and vaginal bleeding after insertion.  An IUD may cut the uterus (uterine perforation) when it is inserted. This is rare.  An IUD may cause pelvic inflammatory disease (PID), which is an infection in the uterus and fallopian tubes. This is rare, and it usually happens during the first 20 days after the IUD is inserted.  A copper IUD can make your menstrual flow heavier and more painful. How is an IUD removed?  You will lie on your back with your knees bent and your feet in footrests (stirrups).  A device will be inserted into your vagina to spread  apart the vaginal walls (speculum). This will allow your health care provider to see the strings attached to the IUD.  Your health care provider will use a small instrument (forceps) to grasp the IUD strings and pull firmly until the IUD is removed. You may have some discomfort when the IUD is removed. Your health care provider may recommend taking over-the-counter pain relievers, such as ibuprofen, before the procedure. You may also have minor spotting for a few days after the procedure. The exact procedure for IUD  removal may vary among health care providers and hospitals. Is the IUD right for me? Your health care provider will make sure you are a good candidate for an IUD and will discuss the advantages, disadvantages, and possible side effects with you. Summary  An intrauterine device (IUD) is a medical device that is inserted in the uterus to prevent pregnancy. It is a small, T-shaped device that has one or two nylon strings hanging down from it.  A hormone IUD contains the hormone progestin (synthetic progesterone). A copper IUD has copper wire wrapped around it.  Synthetic progesterone in a hormone IUD prevents pregnancy by thickening cervical mucus and thinning the walls of the uterus. Copper in a copper IUD prevents pregnancy by making the uterus and fallopian tubes produce a fluid that kills sperm.  A hormone IUD can be left in place for 3-5 years. A copper IUD can be left in place for up to 10 years.  An IUD is inserted and removed by a health care provider. You may feel some pain during insertion and removal. Your health care provider may recommend taking over-the-counter pain medicine, such as ibuprofen, before an IUD procedure. This information is not intended to replace advice given to you by your health care provider. Make sure you discuss any questions you have with your health care provider. Document Revised: 05/12/2017 Document Reviewed: 06/28/2016 Elsevier Patient Education  2020 [employer].

## 2019-09-04 NOTE — Progress Notes (Signed)
Virtual Visit via Telephone Note  I connected with Linda Ortiz on 09/04/19 at  4:38 PM EDT by telephone and verified that I am speaking with the correct person using two identifiers.   I discussed the limitations, risks, security and privacy concerns of performing an evaluation and management service by telephone and the availability of in person appointments. I also discussed with the patient that there may be a patient responsible charge related to this service. The patient expressed understanding and agreed to proceed.   History of Present Illness: Linda Ortiz G0P0000 is a 49 y.o. G0P0000 female who presents via televisit for discussion of management options for irregular bleeding. Patient has a prior history of morbid obesity, endometriosis and PCOS.  She has been amenorrheic for the past 2 years following a 26-month regimen of Depot Lupron therapy.  Recently had an episode of bleeding this past month.  Patient initially thought that this was postmenopausal bleeding, however once labs and ultrasound were performed it was noted that patient is not yet menopausal.  She initially desired to resume Lupron therapy, as she states she does not desires to resume menstrual cycles, however the medication is currently on back order.  Desires to discuss other options today.     Observations/Objective: Height 5\' 3"  (1.6 m), weight (!) 374 lb (169.6 kg), last menstrual period 08/17/2019. Body mass index is 66.25 kg/m.  Height and weight are self-reported.    Labs:  Office Visit on 08/20/2019  . [facility] 08/20/2019 7.9  mIU/mL Final   Comment:                     Adult Female:                       Follicular phase      2.4 -  12.6                       Ovulation phase      14.0 -  95.6                       Luteal phase          1.0 -  11.4                       Postmenopausal        7.7 -  58.5   . [facility] 08/20/2019 6.2  mIU/mL Final   Comment:                     Adult Female:   Follicular phase      3.5 -  12.5                       Ovulation phase       4.7 -  21.5                       Luteal phase          1.7 -   7.7                       Postmenopausal       25.8 - 134.8   . Estradiol 08/20/2019 54.4  pg/mL Final   Comment:  Adult Female:                       Follicular phase   12.5 -   166.0                       Ovulation phase    85.8 -   498.0                       Luteal phase       43.8 -   211.0                       Postmenopausal     <6.0 -    54.7                     Pregnancy                       1st trimester     215.0 - >4300.0 Roche ECLIA methodology   . Progesterone 08/20/2019 0.1  ng/mL Final   Comment:                      Follicular phase       0.1 -   0.9                      Luteal phase           1.8 -  23.9                      Ovulation phase        0.1 -  12.0                      Pregnant                         First trimester    11.0 -  44.3                         Second trimester   25.4 -  83.3                         Third trimester    58.7 - 214.0                      Postmenopausal         0.0 -   0.1     CBC, TSH labs reviewed in Care Everywhere, normal (performed 08/29/2019)   Imaging:  US PELVIS (TRANSABDOMINAL ONLY) Patient Name: NICKI FURLAN DOB: 1971/01/16 MRN: [id number] ULTRASOUND REPORT  Location: [facility] Date of Service: 08/20/2019   Indications:Pelvic Pain   Findings:  The uterus is anteverted and measures 9.5 x 3.2 x 4.3 cm. Echo texture is homogenous without evidence of focal masses.  The Endometrium measures 5 mm.  Right Ovary measures 2.2 x 1.6 x 1.9 cm. It is normal in appearance. Left Ovary measures 2.3 x 1.4 x 1.8 cm. It is normal in appearance.  Survey of the adnexa demonstrates no adnexal masses. There is no free fluid in the cul de sac.  Impression: 1.Limited pelvic ultrasound due to body habitus. Patient refused  transvaginal ultrasound at  this time. 2. No gross abnormalities seen  in the adnexal region.  Recommendations: 1.Clinical correlation with the patient's History and Physical Exam.  Jenine M. Marciano Sequin    RDMS  I have reviewed this study and agree with documented findings.   Hildred Laser, MD [facility]    Assessment and Plan: 1. Irregular cycle 2. PCOS 3. Endometriosis 4. Morbid obesity   Discussed management options for her irregular cycle (with history of PCOS and endometriosis) including oral progesterone, Depo Provera (although may be less effective due to BMI), Levonogestrel IUD, or could consider surgical management endometrial ablation or hysterectomy as definitive surgical management with endometrial ablation. Would not hysterectomy at this time.  Discussed risks and benefits of each method.   After discussion, patient ok to try IUD. Will give handout through Mychart.  Also, will prescribe Cytotec pre-procedure for her.   Follow Up Instructions:   Follow up in 1 week for IUD insertion.   I discussed the assessment and treatment plan with the patient. The patient was provided an opportunity to ask questions and all were answered. The patient agreed with the plan and demonstrated an understanding of the instructions.   The patient was advised to call back or seek an in-person evaluation if the symptoms worsen or if the condition fails to improve as anticipated.  I provided 18 minutes of non-face-to-face time during this encounter.   Hildred Laser, MD  [facility]

## 2019-09-10 ENCOUNTER — Ambulatory Visit: Payer: BC Managed Care – PPO | Admitting: Obstetrics and Gynecology

## 2019-09-10 ENCOUNTER — Other Ambulatory Visit: Payer: Self-pay

## 2019-09-10 ENCOUNTER — Encounter: Payer: Self-pay | Admitting: Obstetrics and Gynecology

## 2019-09-10 ENCOUNTER — Telehealth: Payer: Self-pay | Admitting: Obstetrics and Gynecology

## 2019-09-10 VITALS — BP 127/83 | HR 87 | Ht 63.5 in | Wt 377.1 lb

## 2019-09-10 DIAGNOSIS — N924 Excessive bleeding in the premenopausal period: Secondary | ICD-10-CM

## 2019-09-10 DIAGNOSIS — Z30430 Encounter for insertion of intrauterine contraceptive device: Secondary | ICD-10-CM | POA: Diagnosis not present

## 2019-09-10 LAB — POCT URINE PREGNANCY: Preg Test, Ur: NEGATIVE

## 2019-09-10 NOTE — Patient Instructions (Signed)
Intrauterine Device Insertion, Care After  This sheet gives you information about how to care for yourself after your procedure. Your health care provider may also give you more specific instructions. If you have problems or questions, contact your health care provider. What can I expect after the procedure? After the procedure, it is common to have:  Cramps and pain in the abdomen.  Light bleeding (spotting) or heavier bleeding that is like your menstrual period. This may last for up to a few days.  Lower back pain.  Dizziness.  Headaches.  Nausea. Follow these instructions at home:  Before resuming sexual activity, check to make sure that you can feel the IUD string(s). You should be able to feel the end of the string(s) below the opening of your cervix. If your IUD string is in place, you may resume sexual activity. ? If you had a hormonal IUD inserted more than 7 days after your most recent period started, you will need to use a backup method of birth control for 7 days after IUD insertion. Ask your health care provider whether this applies to you.  Continue to check that the IUD is still in place by feeling for the string(s) after every menstrual period, or once a month.  Take over-the-counter and prescription medicines only as told by your health care provider.  Do not drive or use heavy machinery while taking prescription pain medicine.  Keep all follow-up visits as told by your health care provider. This is important. Contact a health care provider if:  You have bleeding that is heavier or lasts longer than a normal menstrual cycle.  You have a fever.  You have cramps or abdominal pain that get worse or do not get better with medicine.  You develop abdominal pain that is new or is not in the same area of earlier cramping and pain.  You feel lightheaded or weak.  You have abnormal or bad-smelling discharge from your vagina.  You have pain during sexual  activity.  You have any of the following problems with your IUD string(s): ? The string bothers or hurts you or your sexual partner. ? You cannot feel the string. ? The string has gotten longer.  You can feel the IUD in your vagina.  You think you may be pregnant, or you miss your menstrual period.  You think you may have an STI (sexually transmitted infection). Get help right away if:  You have flu-like symptoms.  You have a fever and chills.  You can feel that your IUD has slipped out of place. Summary  After the procedure, it is common to have cramps and pain in the abdomen. It is also common to have light bleeding (spotting) or heavier bleeding that is like your menstrual period.  Continue to check that the IUD is still in place by feeling for the string(s) after every menstrual period, or once a month.  Keep all follow-up visits as told by your health care provider. This is important.  Contact your health care provider if you have problems with your IUD string(s), such as the string getting longer or bothering you or your sexual partner. This information is not intended to replace advice given to you by your health care provider. Make sure you discuss any questions you have with your health care provider. Document Revised: 05/12/2017 Document Reviewed: 04/20/2016 Elsevier Patient Education  2020 Elsevier Inc.  

## 2019-09-10 NOTE — Telephone Encounter (Signed)
Patient called in saying she was having an reaction to a medication that she was prescribed before her IUD insert today. She stated she was having shivers. Informed patient she should go to ED. Patient would like a call from a nurse.

## 2019-09-10 NOTE — Progress Notes (Signed)
    GYNECOLOGY OFFICE PROCEDURE NOTE  CAMANI SESAY is a 49 y.o. G0P0000 here for Mirena IUD insertion. IUD being placed for irregular perimenopausal bleeding.    IUD Insertion Procedure Note Patient identified, informed consent performed, consent signed.   Discussed risks of irregular bleeding, cramping, infection, malpositioning or misplacement of the IUD outside the uterus which may require further procedure such as laparoscopy. Also discussed >99% contraception efficacy, increased risk of ectopic pregnancy with failure of method.  Time out was performed.  Urine pregnancy test negative.  Speculum placed in the vagina.  Cervix visualized.  Cleaned with Betadine x 2.  Grasped anteriorly with a single tooth tenaculum.  Uterus sounded to 6 cm.  Mirena IUD placed per manufacturer's recommendations.  Strings trimmed to 3 cm. Tenaculum was removed, good hemostasis noted.  Patient tolerated procedure well.   Patient was given post-procedure instructions.  She was advised to have backup contraception for one week.  Patient was also asked to check IUD strings periodically and follow up in 4 weeks for IUD check.   Lot: [id number] Exp: 12/2021   Hildred Laser, MD [facility]

## 2019-09-10 NOTE — Telephone Encounter (Signed)
Pt called and stated that she was doing well no and the symptoms has stopped.

## 2019-10-09 ENCOUNTER — Ambulatory Visit: Payer: BC Managed Care – PPO | Admitting: Obstetrics and Gynecology

## 2019-10-09 ENCOUNTER — Encounter: Payer: Self-pay | Admitting: Obstetrics and Gynecology

## 2019-10-09 ENCOUNTER — Other Ambulatory Visit: Payer: Self-pay

## 2019-10-09 VITALS — BP 130/85 | HR 103 | Ht 63.5 in | Wt 375.6 lb

## 2019-10-09 DIAGNOSIS — Z6841 Body Mass Index (BMI) 40.0 and over, adult: Secondary | ICD-10-CM

## 2019-10-09 DIAGNOSIS — Z30431 Encounter for routine checking of intrauterine contraceptive device: Secondary | ICD-10-CM | POA: Diagnosis not present

## 2019-10-09 DIAGNOSIS — N924 Excessive bleeding in the premenopausal period: Secondary | ICD-10-CM

## 2019-10-09 NOTE — Progress Notes (Signed)
    GYNECOLOGY OFFICE ENCOUNTER NOTE  History:  49 y.o. G0P0000 here today for today for IUD string check; Mirena  IUD was placed  09/10/2019 for complaints of abnormal uterine bleeding. No complaints about the IUD, no concerning side effects.  The following portions of the patient's history were reviewed and updated as appropriate: allergies, current medications, past family history, past medical history, past social history, past surgical history and problem list. .  Review of Systems:  Pertinent items are noted in HPI.   Objective:  Physical Exam Blood pressure 130/85, pulse (!) 103, height 5' 3.5" (1.613 m), weight (!) 375 lb 9.6 oz (170.4 kg), last menstrual period 08/17/2019. Body mass index is 65.49 kg/m.   CONSTITUTIONAL: Well-developed, well-nourished female in no acute distress. Morbidly obese ABDOMEN: Soft, no distention noted.   PELVIC: Normal appearing external genitalia; normal appearing vaginal mucosa and cervix.  IUD strings visualized, about 4 cm in length outside cervix.  EXTREMITIES: Non-tender, no edma. NEUROLOGIC: Grossly intact.   Assessment & Plan:  Patient to keep IUD in place for up to seven years; can come in for removal if she has any  any concerning side effects. Also can help in prevention of endometrial hyper plasiadue to unopposed estrogen.

## 2019-10-09 NOTE — Progress Notes (Signed)
Pt present for IUD string check. Pt stated that she was doing well and has noitced no issues with her IUD.

## 2020-04-02 ENCOUNTER — Other Ambulatory Visit: Payer: Self-pay | Admitting: Internal Medicine

## 2020-04-02 ENCOUNTER — Ambulatory Visit
Admission: RE | Admit: 2020-04-02 | Discharge: 2020-04-02 | Disposition: A | Discharge status: Home / Self Care | Payer: BC Managed Care – PPO | Source: Ambulatory Visit | Attending: Internal Medicine | Admitting: Internal Medicine

## 2020-04-02 ENCOUNTER — Other Ambulatory Visit: Payer: Self-pay

## 2020-04-02 DIAGNOSIS — S8012XA Contusion of left lower leg, initial encounter: Secondary | ICD-10-CM | POA: Insufficient documentation

## 2020-04-02 DIAGNOSIS — R600 Localized edema: Secondary | ICD-10-CM | POA: Insufficient documentation

## 2020-07-31 ENCOUNTER — Other Ambulatory Visit: Payer: Self-pay | Admitting: Internal Medicine

## 2020-07-31 DIAGNOSIS — Z1231 Encounter for screening mammogram for malignant neoplasm of breast: Secondary | ICD-10-CM

## 2020-08-19 ENCOUNTER — Other Ambulatory Visit (HOSPITAL_COMMUNITY)
Admission: RE | Admit: 2020-08-19 | Discharge: 2020-08-19 | Disposition: A | Discharge status: Home / Self Care | Payer: BC Managed Care – PPO | Source: Ambulatory Visit | Attending: Obstetrics and Gynecology | Admitting: Obstetrics and Gynecology

## 2020-08-19 ENCOUNTER — Ambulatory Visit (INDEPENDENT_AMBULATORY_CARE_PROVIDER_SITE_OTHER): Payer: BC Managed Care – PPO | Admitting: Obstetrics and Gynecology

## 2020-08-19 ENCOUNTER — Encounter: Payer: BC Managed Care – PPO | Admitting: Obstetrics and Gynecology

## 2020-08-19 ENCOUNTER — Other Ambulatory Visit: Payer: Self-pay

## 2020-08-19 ENCOUNTER — Ambulatory Visit
Admission: RE | Admit: 2020-08-19 | Discharge: 2020-08-19 | Disposition: A | Discharge status: Home / Self Care | Payer: BC Managed Care – PPO | Source: Ambulatory Visit | Attending: Internal Medicine | Admitting: Internal Medicine

## 2020-08-19 ENCOUNTER — Encounter: Payer: Self-pay | Admitting: Obstetrics and Gynecology

## 2020-08-19 VITALS — BP 146/99 | HR 98 | Ht 63.5 in | Wt 368.2 lb

## 2020-08-19 DIAGNOSIS — Z113 Encounter for screening for infections with a predominantly sexual mode of transmission: Secondary | ICD-10-CM | POA: Diagnosis present

## 2020-08-19 DIAGNOSIS — Z1231 Encounter for screening mammogram for malignant neoplasm of breast: Secondary | ICD-10-CM | POA: Diagnosis present

## 2020-08-19 NOTE — Progress Notes (Signed)
Pt present for STD screening.  

## 2020-08-19 NOTE — Patient Instructions (Signed)
Safe Sex Practicing safe sex means taking steps before and during sex to reduce your risk of:  Getting an STI (sexually transmitted infection).  Giving your partner an STI.  Unwanted or unplanned pregnancy. How to practice safe sex Ways you can practice safe sex  Limit your sexual partners to only one partner who is having sex with only you.  Avoid using alcohol and drugs before having sex. Alcohol and drugs can affect your judgment.  Before having sex with a new partner: ? Talk to your partner about past partners, past STIs, and drug use. ? Get screened for STIs and discuss the results with your partner. Ask your partner to get screened too.  Check your body regularly for sores, blisters, rashes, or unusual discharge. If you notice any of these problems, visit your health care provider.  Avoid sexual contact if you have symptoms of an infection or you are being treated for an STI.  While having sex, use a condom. Make sure to: ? Use a condom every time you have vaginal, oral, or anal sex. Both females and males should wear condoms during oral sex. ? Keep condoms in place from the beginning to the end of sexual activity. ? Use a latex condom, if possible. Latex condoms offer the best protection. ? Use only water-based lubricants with a condom. Using petroleum-based lubricants or oils will weaken the condom and increase the chance that it will break.   Ways your health care provider can help you practice safe sex  See your health care provider for regular screenings, exams, and tests for STIs.  Talk with your health care provider about what kind of birth control (contraception) is best for you.  Get vaccinated against hepatitis B and human papillomavirus (HPV).  If you are at risk of being infected with HIV (human immunodeficiency virus), talk with your health care provider about taking a prescription medicine to prevent HIV infection. You are at risk for HIV if you: ? Are a man  who has sex with other men. ? Are sexually active with more than one partner. ? Take drugs by injection. ? Have a sex partner who has HIV. ? Have unprotected sex. ? Have sex with someone who has sex with both men and women. ? Have had an STI.   Follow these instructions at home:  Take over-the-counter and prescription medicines only as told by your health care provider.  Keep all follow-up visits. This is important. Where to find more information  Centers for Disease Control and Prevention: www.cdc.gov  Planned Parenthood: www.plannedparenthood.org  Office on Women's Health: www.womenshealth.gov Summary  Practicing safe sex means taking steps before and during sex to reduce your risk getting an STI, giving your partner an STI, and having an unwanted or unplanned pregnancy.  Before having sex with a new partner, talk to your partner about past partners, past STIs, and drug use.  Use a condom every time you have vaginal, oral, or anal sex. Both females and males should wear condoms during oral sex.  Check your body regularly for sores, blisters, rashes, or unusual discharge. If you notice any of these problems, visit your health care provider.  See your health care provider for regular screenings, exams, and tests for STIs. This information is not intended to replace advice given to you by your health care provider. Make sure you discuss any questions you have with your health care provider. Document Revised: 11/04/2019 Document Reviewed: 11/04/2019 Elsevier Patient Education  2021 Elsevier Inc.  

## 2020-08-19 NOTE — Progress Notes (Signed)
    GYNECOLOGY PROGRESS NOTE  Subjective:    Patient ID: Linda Ortiz, female    DOB: January 08, 1971, 50 y.o.   MRN: [id number]  HPI  Patient is a 50 y.o. G0P0000 female who presents for STD screening. Denies exposure. Notes that she and her partner have been using condoms and now desire to no longer utilize. Both have agreed to getting tested prior to engaging in unprotected intercourse. Has IUD in place for contraception (and management of cycles)  The following portions of the patient's history were reviewed and updated as appropriate: allergies, current medications, past family history, past medical history, past social history, past surgical history and problem list.  Review of Systems Pertinent items noted in HPI and remainder of comprehensive ROS otherwise negative.   Objective:   Blood pressure (!) 146/99, pulse 98, height 5' 3.5" (1.613 m), weight (!) 368 lb 3.2 oz (167 kg), last menstrual period 08/17/2019. General appearance: alert and no distress Pelvic: external genitalia normal. Internal pelvic exam not done. Vaginal swab performed.    Assessment:   STD screening  Plan:   1. STD screening ordered, including HIV. Did not include HSV screening.   2. RTC as needed.    Hildred Laser, MD [facility]

## 2020-08-20 LAB — RPR: RPR Ser Ql: NONREACTIVE

## 2020-08-20 LAB — HEPATITIS C ANTIBODY: Hep C Virus Ab: <0.1 s/co ratio (ref 0.0–0.9)

## 2020-08-20 LAB — HEPATITIS B SURFACE ANTIGEN: Hepatitis B Surface Ag: NEGATIVE

## 2020-08-20 LAB — HIV ANTIBODY (ROUTINE TESTING W REFLEX): HIV Screen 4th Generation wRfx: NONREACTIVE

## 2020-08-21 LAB — CERVICOVAGINAL ANCILLARY ONLY
Chlamydia: NEGATIVE
Comment: NEGATIVE
Comment: NEGATIVE
Comment: NORMAL
Neisseria Gonorrhea: NEGATIVE
Trichomonas: NEGATIVE

## 2020-08-26 ENCOUNTER — Other Ambulatory Visit: Payer: Self-pay | Admitting: Internal Medicine

## 2020-08-26 DIAGNOSIS — R928 Other abnormal and inconclusive findings on diagnostic imaging of breast: Secondary | ICD-10-CM

## 2020-08-26 DIAGNOSIS — N6489 Other specified disorders of breast: Secondary | ICD-10-CM

## 2020-08-27 ENCOUNTER — Encounter: Payer: Self-pay | Admitting: Obstetrics and Gynecology

## 2020-09-02 ENCOUNTER — Other Ambulatory Visit: Payer: Self-pay

## 2020-09-02 ENCOUNTER — Ambulatory Visit
Admission: RE | Admit: 2020-09-02 | Discharge: 2020-09-02 | Disposition: A | Discharge status: Home / Self Care | Payer: BC Managed Care – PPO | Source: Ambulatory Visit | Attending: Internal Medicine | Admitting: Internal Medicine

## 2020-09-02 DIAGNOSIS — N6489 Other specified disorders of breast: Secondary | ICD-10-CM | POA: Diagnosis present

## 2020-09-02 DIAGNOSIS — R928 Other abnormal and inconclusive findings on diagnostic imaging of breast: Secondary | ICD-10-CM | POA: Insufficient documentation

## 2020-09-03 ENCOUNTER — Other Ambulatory Visit: Payer: Self-pay | Admitting: Internal Medicine

## 2020-09-03 DIAGNOSIS — R928 Other abnormal and inconclusive findings on diagnostic imaging of breast: Secondary | ICD-10-CM

## 2020-09-03 DIAGNOSIS — N6489 Other specified disorders of breast: Secondary | ICD-10-CM

## 2020-09-11 ENCOUNTER — Ambulatory Visit
Admission: RE | Admit: 2020-09-11 | Discharge: 2020-09-11 | Disposition: A | Discharge status: Home / Self Care | Payer: BC Managed Care – PPO | Source: Ambulatory Visit | Attending: Internal Medicine | Admitting: Internal Medicine

## 2020-09-11 ENCOUNTER — Other Ambulatory Visit: Payer: Self-pay

## 2020-09-11 DIAGNOSIS — N6489 Other specified disorders of breast: Secondary | ICD-10-CM | POA: Insufficient documentation

## 2020-09-11 DIAGNOSIS — I2699 Other pulmonary embolism without acute cor pulmonale: Secondary | ICD-10-CM

## 2020-09-11 DIAGNOSIS — R928 Other abnormal and inconclusive findings on diagnostic imaging of breast: Secondary | ICD-10-CM | POA: Diagnosis present

## 2020-09-11 HISTORY — DX: Other pulmonary embolism without acute cor pulmonale: I26.99

## 2020-09-11 HISTORY — PX: BREAST BIOPSY: SHX20

## 2020-09-14 LAB — SURGICAL PATHOLOGY

## 2020-09-28 ENCOUNTER — Telehealth: Payer: Self-pay | Admitting: Obstetrics and Gynecology

## 2020-09-28 NOTE — Telephone Encounter (Signed)
Pt called she currently has an IUD and Is wanting to discuss Depo options. Please Advise.

## 2020-09-30 NOTE — Telephone Encounter (Signed)
Pt called no answer LM via VM. Informed pt that [facility] suggested that she do not get the depo injections due to her weight and suggested that she keep the IUD. Requested that pt return call to the office to discuss the reasons why she would like her IUD to removed.

## 2020-10-08 ENCOUNTER — Encounter: Payer: BC Managed Care – PPO | Admitting: Obstetrics and Gynecology

## 2020-10-08 ENCOUNTER — Emergency Department: Payer: BC Managed Care – PPO

## 2020-10-08 ENCOUNTER — Other Ambulatory Visit: Payer: Self-pay

## 2020-10-08 ENCOUNTER — Emergency Department
Admission: EM | Admit: 2020-10-08 | Discharge: 2020-10-08 | Disposition: A | Discharge status: Home / Self Care | Payer: BC Managed Care – PPO | Attending: Emergency Medicine | Admitting: Emergency Medicine

## 2020-10-08 DIAGNOSIS — R1010 Upper abdominal pain, unspecified: Secondary | ICD-10-CM | POA: Insufficient documentation

## 2020-10-08 DIAGNOSIS — F17210 Nicotine dependence, cigarettes, uncomplicated: Secondary | ICD-10-CM | POA: Insufficient documentation

## 2020-10-08 DIAGNOSIS — R112 Nausea with vomiting, unspecified: Secondary | ICD-10-CM | POA: Diagnosis not present

## 2020-10-08 LAB — URINALYSIS, COMPLETE (UACMP) WITH MICROSCOPIC
Bacteria, UA: NONE SEEN
Bilirubin Urine: NEGATIVE
Glucose, UA: NEGATIVE mg/dL
Hgb urine dipstick: NEGATIVE
Ketones, ur: NEGATIVE mg/dL
Nitrite: NEGATIVE
Protein, ur: NEGATIVE mg/dL
Specific Gravity, Urine: 1.001 — ABNORMAL LOW (ref 1.005–1.030)
pH: 6 (ref 5.0–8.0)

## 2020-10-08 LAB — COMPREHENSIVE METABOLIC PANEL
ALT: 29 U/L (ref 0–44)
AST: 25 U/L (ref 15–41)
Albumin: 3.7 g/dL (ref 3.5–5.0)
Alkaline Phosphatase: 81 U/L (ref 38–126)
Anion gap: 9 (ref 5–15)
BUN: 8 mg/dL (ref 6–20)
CO2: 24 mmol/L (ref 22–32)
Calcium: 9.4 mg/dL (ref 8.9–10.3)
Chloride: 104 mmol/L (ref 98–111)
Creatinine, Ser: 0.98 mg/dL (ref 0.44–1.00)
GFR, Estimated: >60 mL/min (ref >60)
Glucose, Bld: 87 mg/dL (ref 70–99)
Potassium: 4.2 mmol/L (ref 3.5–5.1)
Sodium: 137 mmol/L (ref 135–145)
Total Bilirubin: 0.7 mg/dL (ref 0.3–1.2)
Total Protein: 6.9 g/dL (ref 6.5–8.1)

## 2020-10-08 LAB — CBC
HCT: 48.6 % — ABNORMAL HIGH (ref 36.0–46.0)
Hemoglobin: 16.7 g/dL — ABNORMAL HIGH (ref 12.0–15.0)
MCH: 34.8 pg — ABNORMAL HIGH (ref 26.0–34.0)
MCHC: 34.4 g/dL (ref 30.0–36.0)
MCV: 101.3 fL — ABNORMAL HIGH (ref 80.0–100.0)
Platelets: 270 10*3/uL (ref 150–400)
RBC: 4.8 MIL/uL (ref 3.87–5.11)
RDW: 14.4 % (ref 11.5–15.5)
WBC: 10.7 10*3/uL — ABNORMAL HIGH (ref 4.0–10.5)
nRBC: 0 % (ref 0.0–0.2)

## 2020-10-08 LAB — POC URINE PREG, ED: Preg Test, Ur: NEGATIVE

## 2020-10-08 LAB — LIPASE, BLOOD: Lipase: 29 U/L (ref 11–51)

## 2020-10-08 MED ORDER — IOHEXOL 9 MG/ML PO SOLN
500.0000 mL | ORAL | Status: AC
  Administered 2020-10-08 (×2): 500 mL via ORAL

## 2020-10-08 MED ORDER — ACETAMINOPHEN 500 MG PO TABS
1000.0000 mg | ORAL_TABLET | Freq: Once | ORAL | Status: AC
  Administered 2020-10-08: 1000 mg via ORAL
  Filled 2020-10-08: qty 2

## 2020-10-08 MED ORDER — ONDANSETRON HCL 4 MG/2ML IJ SOLN
4.0000 mg | Freq: Once | INTRAMUSCULAR | Status: AC
  Administered 2020-10-08: 4 mg via INTRAVENOUS
  Filled 2020-10-08: qty 2

## 2020-10-08 MED ORDER — ONDANSETRON 4 MG PO TBDP: 4.0000 mg | ORAL_TABLET | Freq: Three times a day (TID) | ORAL | 0 refills | Status: DC | PRN

## 2020-10-08 MED ORDER — SODIUM CHLORIDE 0.9 % IV BOLUS
1000.0000 mL | Freq: Once | INTRAVENOUS | Status: AC
  Administered 2020-10-08: 1000 mL via INTRAVENOUS

## 2020-10-08 MED ORDER — IOHEXOL 300 MG/ML  SOLN
125.0000 mL | Freq: Once | INTRAMUSCULAR | Status: AC | PRN
  Administered 2020-10-08: 125 mL via INTRAVENOUS

## 2020-10-08 NOTE — ED Provider Notes (Signed)
[facility] Emergency Department Provider Note  Time seen: 4:56 PM  I have reviewed the triage vital signs and the nursing notes.   HISTORY  Chief Complaint Abdominal Pain  HPI Linda Ortiz is a 50 y.o. female with a past medical history of anxiety, obesity, pancreatitis, presents to the emergency department for upper abdominal pain nausea vomiting.  According to the patient for the past 5 days or so she has been experiencing upper abdominal pain with intermittent nausea and vomiting.  Patient states a history of pancreatitis in the past.  Patient does drink alcohol states 2 to 3 glasses of wine per day on average.  Denies any fever or lower abdominal pain.  Denies any dysuria.   Past Medical History:  Diagnosis Date  . Anxiety   . Complication of anesthesia   . Depression   . Diverticulitis   . Dysmenorrhea   . Endometriosis   . Hematoma of right lower extremity   . Menorrhagia   . Morbid obesity (HCC)   . Pancreatitis, acute   . PCOS (polycystic ovarian syndrome)   . Pneumonia   . PONV (postoperative nausea and vomiting)    General Anesthesia  . Rib fracture    Left     Patient Active Problem List   Diagnosis Date Noted  . Pneumonia 06/07/2017  . OSA (obstructive sleep apnea) 03/01/2016  . BMI 60.0-69.9, adult (HCC) 02/29/2016  . Acute gallstone pancreatitis   . Depression 10/14/2015  . Pancreatitis, acute 10/14/2015  . Gallstones 10/14/2015  . AKI (acute kidney injury) (HCC) 10/14/2015  . Diverticulitis large intestine 08/25/2015  . Diverticulitis of large intestine with perforation without bleeding   . Morbid obesity with BMI of 70 and over, adult (HCC) 12/05/2014  . Menorrhagia 12/05/2014  . Dysmenorrhea 12/05/2014  . PCOS (polycystic ovarian syndrome) 12/05/2014  . Endometriosis 12/05/2014  . 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 Routine general medical examination at a health care facility  05/05/2013  . Elevated blood pressure reading without diagnosis of hypertension 05/05/2013  . Pain of both elbows 05/05/2013  . Cyst of joint of ankle or foot 03/23/2013  . Vitamin D deficiency 07/11/2012  . Status post gastric bypass for obesity 07/11/2012  . Generalized anxiety disorder     Past Surgical History:  Procedure Laterality Date  . APPENDECTOMY  2005  . ARTHROSCOPY KNEE W/ DRILLING    . BARIATRIC SURGERY  2007  . BREAST BIOPSY Right 09/11/2020   "Affirm" bx-RIGHT asymmetry-path pending  . CHOLECYSTECTOMY N/A 10/19/2015   Procedure: LAPAROSCOPIC CHOLECYSTECTOMY ;  Surgeon: Leafy Ro, MD;  Location: [facility] ORS;  Service: General;  Laterality: N/A;  . COLONOSCOPY WITH PROPOFOL N/A 06/03/2016   Procedure: COLONOSCOPY WITH PROPOFOL;  Surgeon: Christena Deem, MD;  Location: [facility] ENDOSCOPY;  Service: Endoscopy;  Laterality: N/A;  . ESOPHAGOGASTRODUODENOSCOPY (EGD) WITH PROPOFOL N/A 06/03/2016   Procedure: ESOPHAGOGASTRODUODENOSCOPY (EGD) WITH PROPOFOL;  Surgeon: Christena Deem, MD;  Location: [facility] ENDOSCOPY;  Service: Endoscopy;  Laterality: N/A;  . EYE SURGERY  2017    Prior to Admission medications   Medication Sig Start Date End Date Taking? Authorizing Provider  Cholecalciferol (VITAMIN D3) 25 MCG (1000 UT) CAPS Take by mouth.    [provider]  lamoTRIgine (LAMICTAL) 200 MG tablet Take 200 mg by mouth daily.  01/12/16   [provider]  levonorgestrel (MIRENA) 20 MCG/24HR IUD 1 each by Intrauterine route once. Mirena inserted 09/10/2019  [provider]  SPIRONOLACTONE PO Take by mouth. Patient unsure about milligrams.    [provider]  UNABLE TO FIND Use as directed in the mouth or throat daily. Vit D 12 3000 2 tablets in the morning    [provider]  venlafaxine (EFFEXOR) 75 MG tablet Take 1 tablet (75 mg total) by mouth 2 (two) times daily. Patient taking differently: Take 150 mg by mouth daily. 12/08/15   Hildred Laser, MD  vitamin B-12 (CYANOCOBALAMIN) 100 MCG tablet Take 100 mcg by mouth daily.    [provider]    Allergies  Allergen Reactions  . Morphine And Related Itching    Family History  Problem Relation Age of Onset  . Arthritis Mother        rheumatoid  . Hypertension Mother   . Hyperlipidemia Father   . Prostate cancer Father   . Mental illness Maternal Grandmother        dementia  . Alcohol abuse Maternal Grandmother   . ALS Paternal Grandmother   . Breast cancer Neg Hx     Social History Social History   Tobacco Use  . Smoking status: Current Every Day Smoker    Packs/day: 0.25    Years: 8.00    Pack years: 2.00    Types: Cigarettes    Last attempt to quit: 09/12/2015    Years since quitting: 5.0  . Smokeless tobacco: Never Used  Vaping Use  . Vaping Use: Never used  Substance Use Topics  . Alcohol use: Yes  . Drug use: No    Review of Systems Constitutional: Negative for fever. Cardiovascular: Negative for chest pain. Respiratory: Negative for shortness of breath. Gastrointestinal: Moderate upper abdominal pain with nausea vomiting.   Genitourinary: Negative for urinary compaints Musculoskeletal: Negative for musculoskeletal complaints Neurological: Negative for headache All other ROS negative  ____________________________________________   PHYSICAL EXAM:  VITAL SIGNS: ED Triage Vitals  Enc Vitals Group     BP 10/08/20 1627 (!) 139/95     Pulse Rate 10/08/20 1627 96     Resp 10/08/20 1627 18     Temp 10/08/20 1627 98.2 F (36.8 C)     Temp Source 10/08/20 1627 Oral     SpO2 10/08/20 1627 96 %     Weight 10/08/20 1628 (!) 357 lb (161.9 kg)     Height 10/08/20 1628 5\' 3"  (1.6 m)     Head Circumference --      Peak Flow --      Pain Score 10/08/20 1638 5     Pain Loc --      Pain Edu? --      Excl. in GC? --    Constitutional: Alert and oriented. Well appearing and in no distress. Eyes: Normal exam ENT      Head: Normocephalic  and atraumatic.      Mouth/Throat: Mucous membranes are moist. Cardiovascular: Normal rate, regular rhythm.  Respiratory: Normal respiratory effort without tachypnea nor retractions. Breath sounds are clear Gastrointestinal: Soft, mild upper abdominal tenderness palpation without rebound guarding or distention. Musculoskeletal: Nontender with normal range of motion in all extremities.  Neurologic:  Normal speech and language. No gross focal neurologic deficits Skin:  Skin is warm, dry and intact.  Psychiatric: Mood and affect are normal.   ____________________________________________    EKG  EKG viewed and interpreted by myself shows a normal sinus rhythm at 100 bpm with a narrow QRS, normal axis, normal intervals, no concerning ST changes.  ____________________________________________    RADIOLOGY  CT is essentially negative for acute abnormality.  ____________________________________________   INITIAL IMPRESSION / ASSESSMENT AND PLAN / ED COURSE  Pertinent labs & imaging results that were available during my care of the patient were reviewed by me and considered in my medical decision making (see chart for details).   Patient presents to the emergency department for upper abdominal pain nausea vomiting over the past 5 days.  History of pancreatitis.  Patient does admit to 2-3 alcoholic drinks per day.  We will check labs including a lipase.  We will IV hydrate and treat with Zofran.  We will treat with Tylenol and reassess.  Upper abdominal pain with reassuring labs including a normal lipase.  Differential would include pancreatitis, gastritis, marginal ulcer given history of bypass.  We will proceed with CT imaging to further evaluate.  Patient agreeable.  CT scan is negative for acute abnormality.  Patient could possibly still be suffering from gastritis esophagitis marginal ulcer.  Recommended GI follow-up.  Patient agreeable to plan of care.  Linda Ortiz was evaluated  in Emergency Department on 10/08/2020 for the symptoms described in the history of present illness. She was evaluated in the context of the global COVID-19 pandemic, which necessitated consideration that the patient might be at risk for infection with the SARS-CoV-2 virus that causes COVID-19. Institutional protocols and algorithms that pertain to the evaluation of patients at risk for COVID-19 are in a state of rapid change based on information released by regulatory bodies including the CDC and federal and state organizations. These policies and algorithms were followed during the patient's care in the ED.  ____________________________________________   FINAL CLINICAL IMPRESSION(S) / ED DIAGNOSES  Upper abdominal pain Nausea vomiting   Minna Antis, MD 10/08/20 2000

## 2020-10-08 NOTE — ED Triage Notes (Signed)
Pt with c/o of abdominal pain since Sunday, pt states she has had N/V/D with any eating. Pt with c/o upper abdomen pain and also sometimes with pain in her back. Pt with a history of pancreatitis.

## 2020-10-08 NOTE — ED Notes (Signed)
Pt tolerating PO contrast at this time.  

## 2020-10-08 NOTE — ED Notes (Signed)
Pt returned from CT at this time.  

## 2020-10-08 NOTE — ED Notes (Signed)
Patient transported to CT 

## 2020-10-08 NOTE — Discharge Instructions (Signed)
Please call the number provided for GI medicine to arrange a follow-up appointment for further evaluation.  Return to the emergency department for any significant worsening pain, vomiting unable to keep down fluids or any other symptom personally concerning to yourself.  Please take your prescribed nausea medication as needed but only as written.

## 2020-10-21 ENCOUNTER — Other Ambulatory Visit: Payer: Self-pay | Admitting: Internal Medicine

## 2020-10-21 DIAGNOSIS — M7989 Other specified soft tissue disorders: Secondary | ICD-10-CM

## 2020-10-30 ENCOUNTER — Other Ambulatory Visit
Admission: RE | Admit: 2020-10-30 | Discharge: 2020-10-30 | Disposition: A | Discharge status: Home / Self Care | Payer: BC Managed Care – PPO | Source: Ambulatory Visit | Attending: Family Medicine | Admitting: Family Medicine

## 2020-10-30 ENCOUNTER — Other Ambulatory Visit: Payer: Self-pay | Admitting: Family Medicine

## 2020-10-30 ENCOUNTER — Other Ambulatory Visit (HOSPITAL_COMMUNITY): Payer: Self-pay | Admitting: Family Medicine

## 2020-10-30 DIAGNOSIS — R7989 Other specified abnormal findings of blood chemistry: Secondary | ICD-10-CM

## 2020-10-30 DIAGNOSIS — R062 Wheezing: Secondary | ICD-10-CM | POA: Insufficient documentation

## 2020-10-30 LAB — D-DIMER, QUANTITATIVE: D-Dimer, Quant: 5.48 ug/mL-FEU — ABNORMAL HIGH (ref 0.00–0.50)

## 2020-11-20 ENCOUNTER — Encounter: Payer: BC Managed Care – PPO | Admitting: Obstetrics and Gynecology

## 2020-12-11 ENCOUNTER — Encounter: Payer: Self-pay | Admitting: Obstetrics and Gynecology

## 2021-04-09 ENCOUNTER — Other Ambulatory Visit: Payer: Self-pay | Admitting: Internal Medicine

## 2021-04-09 DIAGNOSIS — Z1231 Encounter for screening mammogram for malignant neoplasm of breast: Secondary | ICD-10-CM

## 2021-05-04 ENCOUNTER — Other Ambulatory Visit: Payer: Self-pay

## 2021-05-04 ENCOUNTER — Emergency Department: Payer: BC Managed Care – PPO

## 2021-05-04 DIAGNOSIS — F17210 Nicotine dependence, cigarettes, uncomplicated: Secondary | ICD-10-CM | POA: Insufficient documentation

## 2021-05-04 DIAGNOSIS — J101 Influenza due to other identified influenza virus with other respiratory manifestations: Secondary | ICD-10-CM | POA: Diagnosis not present

## 2021-05-04 DIAGNOSIS — R0602 Shortness of breath: Secondary | ICD-10-CM | POA: Diagnosis present

## 2021-05-04 DIAGNOSIS — J40 Bronchitis, not specified as acute or chronic: Secondary | ICD-10-CM | POA: Insufficient documentation

## 2021-05-04 DIAGNOSIS — Z20822 Contact with and (suspected) exposure to covid-19: Secondary | ICD-10-CM | POA: Insufficient documentation

## 2021-05-04 LAB — COMPREHENSIVE METABOLIC PANEL
ALT: 20 U/L (ref 0–44)
AST: 24 U/L (ref 15–41)
Albumin: 3.6 g/dL (ref 3.5–5.0)
Alkaline Phosphatase: 80 U/L (ref 38–126)
Anion gap: 7 (ref 5–15)
BUN: 10 mg/dL (ref 6–20)
CO2: 23 mmol/L (ref 22–32)
Calcium: 8.5 mg/dL — ABNORMAL LOW (ref 8.9–10.3)
Chloride: 103 mmol/L (ref 98–111)
Creatinine, Ser: 0.76 mg/dL (ref 0.44–1.00)
GFR, Estimated: >60 mL/min (ref >60)
Glucose, Bld: 114 mg/dL — ABNORMAL HIGH (ref 70–99)
Potassium: 4.1 mmol/L (ref 3.5–5.1)
Sodium: 133 mmol/L — ABNORMAL LOW (ref 135–145)
Total Bilirubin: 0.6 mg/dL (ref 0.3–1.2)
Total Protein: 6.4 g/dL — ABNORMAL LOW (ref 6.5–8.1)

## 2021-05-04 LAB — PROTIME-INR
INR: 2.5 — ABNORMAL HIGH (ref 0.8–1.2)
Prothrombin Time: 27.1 seconds — ABNORMAL HIGH (ref 11.4–15.2)

## 2021-05-04 LAB — CBC WITH DIFFERENTIAL/PLATELET
Abs Immature Granulocytes: 0.03 10*3/uL (ref 0.00–0.07)
Basophils Absolute: 0 10*3/uL (ref 0.0–0.1)
Basophils Relative: 0 %
Eosinophils Absolute: 0.1 10*3/uL (ref 0.0–0.5)
Eosinophils Relative: 2 %
HCT: 37.8 % (ref 36.0–46.0)
Hemoglobin: 13.9 g/dL (ref 12.0–15.0)
Immature Granulocytes: 1 %
Lymphocytes Relative: 33 %
Lymphs Abs: 1.7 10*3/uL (ref 0.7–4.0)
MCH: 41 pg — ABNORMAL HIGH (ref 26.0–34.0)
MCHC: 36.8 g/dL — ABNORMAL HIGH (ref 30.0–36.0)
MCV: 111.5 fL — ABNORMAL HIGH (ref 80.0–100.0)
Monocytes Absolute: 0.3 10*3/uL (ref 0.1–1.0)
Monocytes Relative: 6 %
Neutro Abs: 3.1 10*3/uL (ref 1.7–7.7)
Neutrophils Relative %: 58 %
Platelets: 195 10*3/uL (ref 150–400)
RBC: 3.39 MIL/uL — ABNORMAL LOW (ref 3.87–5.11)
RDW: 14.3 % (ref 11.5–15.5)
Smear Review: NORMAL
WBC: 5.2 10*3/uL (ref 4.0–10.5)
nRBC: 0.6 % — ABNORMAL HIGH (ref 0.0–0.2)

## 2021-05-04 LAB — APTT: aPTT: 40 seconds — ABNORMAL HIGH (ref 24–36)

## 2021-05-04 MED ORDER — ALBUTEROL SULFATE (2.5 MG/3ML) 0.083% IN NEBU
3.0000 mL | INHALATION_SOLUTION | RESPIRATORY_TRACT | Status: DC | PRN
  Administered 2021-05-05: 3 mL via RESPIRATORY_TRACT
  Filled 2021-05-04: qty 3

## 2021-05-04 NOTE — ED Triage Notes (Signed)
C/O sinus congestion for several days and today c/o fevers and SOB.  Has history of blood clot/ PE and is currently on Coumadin.  AAOx3.  Skin warm and dry.  Sinus congestion noted.

## 2021-05-05 ENCOUNTER — Emergency Department
Admission: EM | Admit: 2021-05-05 | Discharge: 2021-05-05 | Disposition: A | Discharge status: Home / Self Care | Payer: BC Managed Care – PPO | Attending: Emergency Medicine | Admitting: Emergency Medicine

## 2021-05-05 ENCOUNTER — Emergency Department: Payer: BC Managed Care – PPO

## 2021-05-05 DIAGNOSIS — R0602 Shortness of breath: Secondary | ICD-10-CM

## 2021-05-05 DIAGNOSIS — J40 Bronchitis, not specified as acute or chronic: Secondary | ICD-10-CM

## 2021-05-05 DIAGNOSIS — J101 Influenza due to other identified influenza virus with other respiratory manifestations: Secondary | ICD-10-CM

## 2021-05-05 LAB — RESP PANEL BY RT-PCR (FLU A&B, COVID) ARPGX2
Influenza A by PCR: POSITIVE — AB
Influenza B by PCR: NEGATIVE
SARS Coronavirus 2 by RT PCR: NEGATIVE

## 2021-05-05 LAB — TROPONIN I (HIGH SENSITIVITY): Troponin I (High Sensitivity): 5 ng/L (ref <18)

## 2021-05-05 MED ORDER — PREDNISONE 20 MG PO TABS
60.0000 mg | ORAL_TABLET | Freq: Once | ORAL | Status: AC
  Administered 2021-05-05: 60 mg via ORAL
  Filled 2021-05-05: qty 3

## 2021-05-05 MED ORDER — AZITHROMYCIN 250 MG PO TABS: 250.0000 mg | ORAL_TABLET | Freq: Every day | ORAL | 0 refills | Status: DC

## 2021-05-05 MED ORDER — HYDROCOD POLST-CPM POLST ER 10-8 MG/5ML PO SUER
5.0000 mL | Freq: Two times a day (BID) | ORAL | 0 refills | Status: DC
Start: 2021-05-05 — End: 2022-11-07

## 2021-05-05 MED ORDER — IPRATROPIUM-ALBUTEROL 0.5-2.5 (3) MG/3ML IN SOLN
3.0000 mL | Freq: Once | RESPIRATORY_TRACT | Status: AC
  Administered 2021-05-05: 3 mL via RESPIRATORY_TRACT
  Filled 2021-05-05: qty 3

## 2021-05-05 MED ORDER — IOHEXOL 350 MG/ML SOLN
100.0000 mL | Freq: Once | INTRAVENOUS | Status: AC | PRN
  Administered 2021-05-05: 100 mL via INTRAVENOUS

## 2021-05-05 MED ORDER — PREDNISONE 20 MG PO TABS: ORAL_TABLET | ORAL | 0 refills | Status: DC

## 2021-05-05 MED ORDER — AZITHROMYCIN 500 MG PO TABS
500.0000 mg | ORAL_TABLET | Freq: Once | ORAL | Status: AC
  Administered 2021-05-05: 500 mg via ORAL
  Filled 2021-05-05: qty 1

## 2021-05-05 MED ORDER — ALBUTEROL SULFATE (2.5 MG/3ML) 0.083% IN NEBU: 2.5000 mg | INHALATION_SOLUTION | RESPIRATORY_TRACT | 0 refills | Status: DC | PRN

## 2021-05-05 MED ORDER — HYDROCOD POLST-CPM POLST ER 10-8 MG/5ML PO SUER
5.0000 mL | Freq: Once | ORAL | Status: AC
  Administered 2021-05-05: 5 mL via ORAL
  Filled 2021-05-05: qty 5

## 2021-05-05 NOTE — ED Provider Notes (Signed)
[facility] Emergency Department Provider Note   ____________________________________________   Event Date/Time   First MD Initiated Contact with Patient 05/05/21 [phone]     (approximate)  I have reviewed the triage vital signs and the nursing notes.   HISTORY  Chief Complaint Shortness of Breath    HPI Linda Ortiz is a 50 y.o. female who presents to the ED from home with a chief complaint of sinus congestion, subjective fevers, cough and shortness of breath times several days.  Patient has a history of PE currently on Coumadin.  Family history of coagulation disorder and remains a smoker.  Endorses chest tightness on coughing.  Denies abdominal pain, nausea, vomiting or diarrhea.      Past Medical History:  Diagnosis Date   Anxiety    Complication of anesthesia    Depression    Diverticulitis    Dysmenorrhea    Endometriosis    Hematoma of right lower extremity    Menorrhagia    Morbid obesity (HCC)    Pancreatitis, acute    PCOS (polycystic ovarian syndrome)    Pneumonia    PONV (postoperative nausea and vomiting)    General Anesthesia   Rib fracture    Left     Patient Active Problem List   Diagnosis Date Noted   Pneumonia 06/07/2017   OSA (obstructive sleep apnea) 03/01/2016   BMI 60.0-69.9, adult (HCC) 02/29/2016   Acute gallstone pancreatitis    Depression 10/14/2015   Pancreatitis, acute 10/14/2015   Gallstones 10/14/2015   AKI (acute kidney injury) (HCC) 10/14/2015   Diverticulitis large intestine 08/25/2015   Diverticulitis of large intestine with perforation without bleeding    Morbid obesity with BMI of 70 and over, adult (HCC) 12/05/2014   Menorrhagia 12/05/2014   Dysmenorrhea 12/05/2014   PCOS (polycystic ovarian syndrome) 12/05/2014   Endometriosis 12/05/2014   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   Entropion, 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   Routine general medical examination at a health care facility 05/05/2013    Elevated blood pressure reading without diagnosis of hypertension 05/05/2013   Pain of both elbows 05/05/2013   Cyst of joint of ankle or foot 03/23/2013   Vitamin D deficiency 07/11/2012   Status post gastric bypass for obesity 07/11/2012   Generalized anxiety disorder     Past Surgical History:  Procedure Laterality Date   APPENDECTOMY  2005   ARTHROSCOPY KNEE W/ DRILLING     BARIATRIC SURGERY  2007   BREAST BIOPSY Right 09/11/2020   "Affirm" bx-RIGHT asymmetry-path pending   CHOLECYSTECTOMY N/A 10/19/2015   Procedure: LAPAROSCOPIC CHOLECYSTECTOMY ;  Surgeon: Leafy Ro, MD;  Location: [facility] ORS;  Service: General;  Laterality: N/A;   COLONOSCOPY WITH PROPOFOL N/A 06/03/2016   Procedure: COLONOSCOPY WITH PROPOFOL;  Surgeon: Christena Deem, MD;  Location: [facility] ENDOSCOPY;  Service: Endoscopy;  Laterality: N/A;   ESOPHAGOGASTRODUODENOSCOPY (EGD) WITH PROPOFOL N/A 06/03/2016   Procedure: ESOPHAGOGASTRODUODENOSCOPY (EGD) WITH PROPOFOL;  Surgeon: Christena Deem, MD;  Location: [facility] ENDOSCOPY;  Service: Endoscopy;  Laterality: N/A;   EYE SURGERY  2017    Prior to Admission medications   Medication Sig Start Date End Date Taking? Authorizing Provider  albuterol (PROVENTIL) (2.5 MG/3ML) 0.083% nebulizer solution Take 3 mLs (2.5 mg total) by nebulization every 4 (four) hours as needed for wheezing or shortness of breath. 05/05/21  Yes Irean Hong, MD  azithromycin (ZITHROMAX) 250 MG tablet Take 1 tablet (250 mg total) by mouth daily. 05/05/21  Yes Chiquita Loth  J, MD  chlorpheniramine-HYDROcodone (TUSSIONEX PENNKINETIC ER) 10-8 MG/5ML SUER Take 5 mLs by mouth 2 (two) times daily. 05/05/21  Yes Irean Hong, MD  predniSONE (DELTASONE) 20 MG tablet 3 tablets daily x 4 days 05/05/21  Yes Irean Hong, MD  Cholecalciferol (VITAMIN D3) 25 MCG (1000 UT) CAPS Take by mouth.    [provider]  lamoTRIgine (LAMICTAL) 200 MG tablet Take 200 mg by mouth daily.  01/12/16   [provider]  levonorgestrel (MIRENA) 20 MCG/24HR IUD 1 each by Intrauterine route once. Mirena inserted 09/10/2019    [provider]  ondansetron (ZOFRAN ODT) 4 MG disintegrating tablet Take 1 tablet (4 mg total) by mouth every 8 (eight) hours as needed for nausea or vomiting. 10/08/20   Minna Antis, MD  SPIRONOLACTONE PO Take by mouth. Patient unsure about milligrams.    [provider]  UNABLE TO FIND Use as directed in the mouth or throat daily. Vit D 12 3000 2 tablets in the morning    [provider]  venlafaxine (EFFEXOR) 75 MG tablet Take 1 tablet (75 mg total) by mouth 2 (two) times daily. Patient taking differently: Take 150 mg by mouth daily. 12/08/15   Hildred Laser, MD  vitamin B-12 (CYANOCOBALAMIN) 100 MCG tablet Take 100 mcg by mouth daily.    [provider]    Allergies Morphine and related  Family History  Problem Relation Age of Onset   Arthritis Mother        rheumatoid   Hypertension Mother    Hyperlipidemia Father    Prostate cancer Father    Mental illness Maternal Grandmother        dementia   Alcohol abuse Maternal Grandmother    ALS Paternal Grandmother    Breast cancer Neg Hx     Social History Social History   Tobacco Use   Smoking status: Every Day    Packs/day: 0.25    Years: 8.00    Pack years: 2.00    Types: Cigarettes    Last attempt to quit: 09/12/2015    Years since quitting: 5.6   Smokeless tobacco: Never  Vaping Use   Vaping Use: Never used  Substance Use Topics   Alcohol use: Yes   Drug use: No    Review of Systems  Constitutional: No fever/chills Eyes: No visual changes. ENT: Positive sinus congestion.  No sore throat. Cardiovascular: Positive for chest pain. Respiratory: Positive for cough and shortness of breath. Gastrointestinal: No abdominal pain.  No nausea, no vomiting.  No diarrhea.  No constipation. Genitourinary: Negative for dysuria. Musculoskeletal: Negative for back  pain. Skin: Negative for rash. Neurological: Negative for headaches, focal weakness or numbness.   ____________________________________________   PHYSICAL EXAM:  VITAL SIGNS: ED Triage Vitals  Enc Vitals Group     BP 05/04/21 1802 114/66     Pulse Rate 05/04/21 1802 97     Resp 05/04/21 1802 (!) 22     Temp 05/04/21 1802 98.4 F (36.9 C)     Temp Source 05/04/21 1802 Oral     SpO2 05/04/21 1802 94 %     Weight 05/04/21 1732 (!) 356 lb 14.8 oz (161.9 kg)     Height 05/04/21 1732 5\' 3"  (1.6 m)     Head Circumference --      Peak Flow --      Pain Score 05/04/21 1732 0     Pain Loc --      Pain Edu? --  Excl. in GC? --     Constitutional: Alert and oriented. Well appearing and in no acute distress. Eyes: Conjunctivae are normal. PERRL. EOMI. Head: Atraumatic. Nose: Congestion/rhinnorhea. Mouth/Throat: Mucous membranes are moist.   Neck: No stridor.   Cardiovascular: Normal rate, regular rhythm. Grossly normal heart sounds.  Good peripheral circulation. Respiratory: Normal respiratory effort.  No retractions. Lungs CTAB.  Bronchitic cough noted. Gastrointestinal: Soft and nontender to light or deep palpation. No distention. No abdominal bruits. No CVA tenderness. Musculoskeletal: No lower extremity tenderness nor edema.  No joint effusions. Neurologic:  Normal speech and language. No gross focal neurologic deficits are appreciated. No gait instability. Skin:  Skin is warm, dry and intact. No rash noted. Psychiatric: Mood and affect are normal. Speech and behavior are normal.  ____________________________________________   LABS (all labs ordered are listed, but only abnormal results are displayed)  Labs Reviewed  RESP PANEL BY RT-PCR (FLU A&B, COVID) ARPGX2 - Abnormal; Notable for the following components:      Result Value   Influenza A by PCR POSITIVE (*)    All other components within normal limits  PROTIME-INR - Abnormal; Notable for the following components:    Prothrombin Time 27.1 (*)    INR 2.5 (*)    All other components within normal limits  APTT - Abnormal; Notable for the following components:   aPTT 40 (*)    All other components within normal limits  CBC WITH DIFFERENTIAL/PLATELET - Abnormal; Notable for the following components:   RBC 3.39 (*)    MCV 111.5 (*)    MCH 41.0 (*)    MCHC 36.8 (*)    nRBC 0.6 (*)    All other components within normal limits  COMPREHENSIVE METABOLIC PANEL - Abnormal; Notable for the following components:   Sodium 133 (*)    Glucose, Bld 114 (*)    Calcium 8.5 (*)    Total Protein 6.4 (*)    All other components within normal limits  TROPONIN I (HIGH SENSITIVITY)   ____________________________________________  EKG  ED ECG REPORT I, Palma Buster J, the attending physician, personally viewed and interpreted this ECG.   Date: 05/05/2021  EKG Time: 1805  Rate: 98  Rhythm: normal sinus rhythm  Axis: Normal  Intervals:none  ST&T Change: Nonspecific  ____________________________________________  RADIOLOGY I, Finch Costanzo J, personally viewed and evaluated these images (plain radiographs) as part of my medical decision making, as well as reviewing the written report by the radiologist.  ED MD interpretation: Peribronchial thickening; no PE  Official radiology report(s): DG Chest 2 View  Result Date: 05/04/2021 CLINICAL DATA:  Shortness of breath.  Sinus congestion. EXAM: CHEST - 2 VIEW COMPARISON:  06/07/2017 FINDINGS: Peribronchial thickening. Minor subsegmental opacities in the lung bases. The heart is normal in size. No pleural effusion or pneumothorax. No acute osseous abnormalities are seen. IMPRESSION: Peribronchial thickening, can be seen with bronchitis or asthma. Minor subsegmental opacities in the lung bases, favor atelectasis. Electronically Signed   By: Narda Rutherford M.D.   On: 05/04/2021 18:10   CT Angio Chest PE W/Cm &/Or Wo Cm  Result Date: 05/05/2021 CLINICAL DATA:  Concern for  pulmonary embolism. EXAM: CT ANGIOGRAPHY CHEST WITH CONTRAST TECHNIQUE: Multidetector CT imaging of the chest was performed using the standard protocol during bolus administration of intravenous contrast. Multiplanar CT image reconstructions and MIPs were obtained to evaluate the vascular anatomy. CONTRAST:  OMNIPAQUE IOHEXOL 350 MG/ML SOLN COMPARISON:  Chest radiograph dated 05/04/2021. FINDINGS: Cardiovascular: There is no cardiomegaly  or pericardial effusion. There is coronary vascular calcification of the LAD. The thoracic aorta is unremarkable. The origins of the great vessels of the aortic arch appear patent. Evaluation of the pulmonary arteries is limited due to respiratory motion artifact. No central pulmonary artery embolus identified. Mediastinum/Nodes: No hilar or mediastinal adenopathy. The esophagus and the thyroid gland are grossly unremarkable. No mediastinal fluid collection. Lungs/Pleura: There is a 4 mm ground-glass nodule at the left lung base (64/7). No focal consolidation, pleural effusion, or pneumothorax. The central airways are patent. Upper Abdomen: Fatty liver. Cholecystectomy. Postsurgical changes of gastric bypass. Musculoskeletal: No chest wall abnormality. No acute or significant osseous findings. Review of the MIP images confirms the above findings. IMPRESSION: 1. No acute intrathoracic pathology. No CT evidence of central pulmonary artery embolus. 2. Coronary vascular calcification of the LAD. 3. Fatty liver. 4. There is a 4 mm ground-glass nodule at the left lung base. No follow-up recommended. This recommendation follows the consensus statement: Guidelines for Management of Incidental Pulmonary Nodules Detected on CT Images: From the Fleischner Society 2017; Radiology 2017; 284:228-243. Electronically Signed   By: Elgie Collard M.D.   On: 05/05/2021 02:51    ____________________________________________   PROCEDURES  Procedure(s) performed (including Critical  Care):  .1-3 Lead EKG Interpretation Performed by: Irean Hong, MD Authorized by: Irean Hong, MD     Interpretation: normal     ECG rate:  84   ECG rate assessment: normal     Rhythm: sinus rhythm     Ectopy: none     Conduction: normal   Comments:     Patient placed on cardiac monitor to evaluate for arrhythmias   ____________________________________________   INITIAL IMPRESSION / ASSESSMENT AND PLAN / ED COURSE  As part of my medical decision making, I reviewed the following data within the electronic MEDICAL RECORD NUMBER Nursing notes reviewed and incorporated, Labs reviewed, EKG interpreted, Old chart reviewed (office visit 05/04/2021), Radiograph reviewed, and Notes from prior ED visits     50 year old female with history of PE currently on Coumadin presenting with chest pain and shortness of breath. Differential includes, but is not limited to, viral syndrome, bronchitis including COPD exacerbation, pneumonia, reactive airway disease including asthma, CHF including exacerbation with or without pulmonary/interstitial edema, pneumothorax, ACS, thoracic trauma, and pulmonary embolism.   Laboratory results demonstrate normal WBC, therapeutic INR.  Will initiate oral prednisone, Tussionex and DuoNeb.  Given patient's history of PE on Coumadin, will obtain CTA chest to evaluate clot burden.  Clinical Course as of 05/05/21 6144  Wed May 05, 2021  0349 Aeration improved.  Room air saturations 97%.  Updated patient on all test results.  She is out of the window for Tamiflu.  Will prescribe Z-Pak, continue prednisone for a total of 5-day burst, Tussionex.  Patient has nebulizer machine but expired medicine at home; will prescribe albuterol nebs.  Strict return precautions given.  Patient verbalizes understanding and agrees with plan of care. [JS]    Clinical Course User Index [JS] Irean Hong, MD     ____________________________________________   FINAL CLINICAL IMPRESSION(S) / ED  DIAGNOSES  Final diagnoses:  Bronchitis  Shortness of breath  Influenza A     ED Discharge Orders          Ordered    predniSONE (DELTASONE) 20 MG tablet        05/05/21 0351    azithromycin (ZITHROMAX) 250 MG tablet  Daily        05/05/21  5625    chlorpheniramine-HYDROcodone (TUSSIONEX PENNKINETIC ER) 10-8 MG/5ML SUER  2 times daily        05/05/21 0351    albuterol (PROVENTIL) (2.5 MG/3ML) 0.083% nebulizer solution  Every 4 hours PRN        05/05/21 0351             Note:  This document was prepared using Dragon voice recognition software and may include unintentional dictation errors.    Irean Hong, MD 05/05/21 [phone]

## 2021-05-05 NOTE — Discharge Instructions (Signed)
1.  Finish antibiotic and steroid as prescribed (Azithromycin/Prednisone). 2.  You may take Tussionex as needed for cough. 3.  You may use Albuterol nebulizer every 4 hours as needed for cough or difficulty breathing. 4.  Return to the ER for worsening symptoms, persistent vomiting, difficulty breathing or other concerns.

## 2021-06-04 ENCOUNTER — Other Ambulatory Visit: Payer: Self-pay | Admitting: Internal Medicine

## 2021-06-04 DIAGNOSIS — N6489 Other specified disorders of breast: Secondary | ICD-10-CM

## 2021-06-23 ENCOUNTER — Other Ambulatory Visit (HOSPITAL_COMMUNITY): Payer: Self-pay | Admitting: Psychiatry

## 2021-06-23 DIAGNOSIS — F331 Major depressive disorder, recurrent, moderate: Secondary | ICD-10-CM

## 2021-06-24 ENCOUNTER — Other Ambulatory Visit (HOSPITAL_COMMUNITY): Payer: Self-pay | Admitting: Psychiatry

## 2021-06-24 MED ORDER — LAMOTRIGINE 200 MG PO TABS: 200.0000 mg | ORAL_TABLET | Freq: Every day | ORAL | 5 refills | Status: DC

## 2021-06-24 NOTE — Progress Notes (Signed)
Rx refilled.  Lady Wisham, PMHNP 

## 2021-08-01 ENCOUNTER — Other Ambulatory Visit (HOSPITAL_COMMUNITY): Payer: Self-pay | Admitting: Psychiatry

## 2021-08-02 ENCOUNTER — Other Ambulatory Visit: Payer: Self-pay | Admitting: Psychiatry

## 2021-08-02 MED ORDER — VENLAFAXINE HCL ER 75 MG PO CP24
150.0000 mg | ORAL_CAPSULE | Freq: Every day | ORAL | 5 refills | Status: DC
Start: 2021-08-02 — End: 2021-08-02

## 2021-08-02 MED ORDER — LAMOTRIGINE 200 MG PO TABS: 200.0000 mg | ORAL_TABLET | Freq: Every day | ORAL | 5 refills | Status: DC

## 2021-08-02 NOTE — Progress Notes (Signed)
Rx for Lamictal and Effexor refilled.  Nanine Means, PMHNP

## 2021-11-22 ENCOUNTER — Other Ambulatory Visit (HOSPITAL_COMMUNITY): Payer: Self-pay | Admitting: Psychiatry

## 2021-11-22 MED ORDER — VENLAFAXINE HCL ER 150 MG PO CP24: ORAL_CAPSULE | ORAL | 5 refills | Status: DC

## 2021-11-22 MED ORDER — LAMOTRIGINE 200 MG PO TABS: 200.0000 mg | ORAL_TABLET | Freq: Every day | ORAL | 5 refills | Status: DC

## 2021-11-22 NOTE — Progress Notes (Signed)
Effexor and Lamictal refilled.  Nanine Means, PMHNP

## 2022-05-18 ENCOUNTER — Ambulatory Visit: Payer: BC Managed Care – PPO | Admitting: Anesthesiology

## 2022-05-18 ENCOUNTER — Encounter: Payer: Self-pay | Admitting: Internal Medicine

## 2022-05-18 ENCOUNTER — Ambulatory Visit
Admission: RE | Admit: 2022-05-18 | Discharge: 2022-05-18 | Disposition: A | Discharge status: Home / Self Care | Payer: BC Managed Care – PPO | Source: Ambulatory Visit | Attending: Internal Medicine | Admitting: Internal Medicine

## 2022-05-18 ENCOUNTER — Encounter
Admission: RE | Disposition: A | Discharge status: Home / Self Care | Payer: Self-pay | Source: Ambulatory Visit | Attending: Internal Medicine

## 2022-05-18 DIAGNOSIS — E282 Polycystic ovarian syndrome: Secondary | ICD-10-CM | POA: Diagnosis not present

## 2022-05-18 DIAGNOSIS — K200 Eosinophilic esophagitis: Secondary | ICD-10-CM | POA: Diagnosis not present

## 2022-05-18 DIAGNOSIS — R1013 Epigastric pain: Secondary | ICD-10-CM | POA: Diagnosis present

## 2022-05-18 DIAGNOSIS — Z6841 Body Mass Index (BMI) 40.0 and over, adult: Secondary | ICD-10-CM | POA: Diagnosis not present

## 2022-05-18 DIAGNOSIS — F32A Depression, unspecified: Secondary | ICD-10-CM | POA: Insufficient documentation

## 2022-05-18 DIAGNOSIS — Z7901 Long term (current) use of anticoagulants: Secondary | ICD-10-CM | POA: Insufficient documentation

## 2022-05-18 DIAGNOSIS — Z79899 Other long term (current) drug therapy: Secondary | ICD-10-CM | POA: Insufficient documentation

## 2022-05-18 DIAGNOSIS — F419 Anxiety disorder, unspecified: Secondary | ICD-10-CM | POA: Insufficient documentation

## 2022-05-18 DIAGNOSIS — K219 Gastro-esophageal reflux disease without esophagitis: Secondary | ICD-10-CM | POA: Diagnosis not present

## 2022-05-18 DIAGNOSIS — Z980 Intestinal bypass and anastomosis status: Secondary | ICD-10-CM | POA: Diagnosis not present

## 2022-05-18 DIAGNOSIS — K2289 Other specified disease of esophagus: Secondary | ICD-10-CM | POA: Diagnosis not present

## 2022-05-18 HISTORY — DX: Coagulation defect, unspecified: D68.9

## 2022-05-18 HISTORY — PX: ESOPHAGOGASTRODUODENOSCOPY: SHX5428

## 2022-05-18 SURGERY — EGD (ESOPHAGOGASTRODUODENOSCOPY)
Anesthesia: General

## 2022-05-18 MED ORDER — LIDOCAINE HCL (CARDIAC) PF 100 MG/5ML IV SOSY
PREFILLED_SYRINGE | INTRAVENOUS | Status: DC | PRN
  Administered 2022-05-18: 50 mg via INTRAVENOUS

## 2022-05-18 MED ORDER — SODIUM CHLORIDE 0.9 % IV SOLN: INTRAVENOUS | Status: DC

## 2022-05-18 MED ORDER — ONDANSETRON HCL 4 MG/2ML IJ SOLN
INTRAMUSCULAR | Status: AC
  Filled 2022-05-18: qty 4

## 2022-05-18 MED ORDER — ONDANSETRON HCL 4 MG/2ML IJ SOLN
INTRAMUSCULAR | Status: DC | PRN
  Administered 2022-05-18: 4 mg via INTRAVENOUS

## 2022-05-18 MED ORDER — PROPOFOL 10 MG/ML IV BOLUS
INTRAVENOUS | Status: DC | PRN
  Administered 2022-05-18: 50 mg via INTRAVENOUS

## 2022-05-18 MED ORDER — DEXMEDETOMIDINE HCL IN NACL 200 MCG/50ML IV SOLN
INTRAVENOUS | Status: DC | PRN
  Administered 2022-05-18: 12 ug via INTRAVENOUS
  Administered 2022-05-18: 8 ug via INTRAVENOUS

## 2022-05-18 MED ORDER — PROPOFOL 500 MG/50ML IV EMUL
INTRAVENOUS | Status: DC | PRN
  Administered 2022-05-18: 150 ug/kg/min via INTRAVENOUS

## 2022-05-18 NOTE — Interval H&P Note (Signed)
History and Physical Interval Note:  05/18/2022 1:54 PM  Linda Ortiz  has presented today for surgery, with the diagnosis of Epigastric pain (R10.13) Gastroesophageal reflux disease, unspecified whether esophagitis present (K21.9).  The various methods of treatment have been discussed with the patient and family. After consideration of risks, benefits and other options for treatment, the patient has consented to  Procedure(s): ESOPHAGOGASTRODUODENOSCOPY (EGD) (N/A) as a surgical intervention.  The patient's history has been reviewed, patient examined, no change in status, stable for surgery.  I have reviewed the patient's chart and labs.  Questions were answered to the patient's satisfaction.     [place], [place]

## 2022-05-18 NOTE — Op Note (Signed)
[facility] Gastroenterology Patient Name: Linda Ortiz Procedure Date: 05/18/2022 1:51 PM MRN: [id number] Account #: [id number] Date of Birth: 08-19-1970 Admit Type: Outpatient Age: 51 Room: [facility] ENDO ROOM 2 Gender: Female Note Status: Finalized Instrument Name: Michaelle Birks [id number] Procedure:             Upper GI endoscopy Indications:           Dyspepsia, Suspected gastro-esophageal reflux disease Providers:             Benay Pike. Alice Reichert MD, MD Referring MD:          Ramonita Lab, MD (Referring MD) Medicines:             Propofol per Anesthesia Complications:         No immediate complications. Procedure:             Pre-Anesthesia Assessment:                        - The risks and benefits of the procedure and the                         sedation options and risks were discussed with the                         patient. All questions were answered and informed                         consent was obtained.                        - Patient identification and proposed procedure were                         verified prior to the procedure by the nurse. The                         procedure was verified in the pre-procedure area in                         the procedure room.                        - ASA Grade Assessment: III - A patient with severe                         systemic disease.                        - After reviewing the risks and benefits, the patient                         was deemed in satisfactory condition to undergo the                         procedure.                        After obtaining informed consent, the endoscope was                         passed under  direct vision. Throughout the procedure,                         the patient's blood pressure, pulse, and oxygen                         saturations were monitored continuously. The Endoscope                         was introduced through the mouth, and advanced to the                          third part of duodenum. The upper GI endoscopy was                         accomplished without difficulty. The patient tolerated                         the procedure well. Findings:      Diffuse mild mucosal changes characterized by punctated white spots were       found in the entire esophagus. Biopsies were obtained from the proximal       and distal esophagus with cold forceps for histology of suspected       eosinophilic esophagitis.      Evidence of a Roux-en-Y gastrojejunostomy was found. The gastrojejunal       anastomosis was characterized by healthy appearing mucosa. This was       traversed. The pouch-to-jejunum limb was characterized by healthy       appearing mucosa. The jejunojejunal anastomosis was characterized by       healthy appearing mucosa. The duodenum-to-jejunum limb was not examined       as it could not be found. The excluded stomach was not examined as it       could not be traversed.      No other significant abnormalities were identified in a careful       examination of the stomach.      The examined jejunum was normal. Impression:            - Punctate white spotted mucosa in the esophagus.                        - Roux-en-Y gastrojejunostomy with gastrojejunal                         anastomosis characterized by healthy appearing mucosa.                        - Normal examined jejunum.                        - Biopsies were taken with a cold forceps for                         evaluation of eosinophilic esophagitis. Recommendation:        - Patient has a contact number available for                         emergencies. The signs and symptoms of potential  delayed complications were discussed with the patient.                         Return to normal activities tomorrow. Written                         discharge instructions were provided to the patient.                        - Resume previous diet.                         - Continue present medications.                        - Await pathology results.                        - Return to GI office at the next available                         appointment.                        - Telephone GI office to schedule appointment. Procedure Code(s):     --- Professional ---                        [phone], Esophagogastroduodenoscopy, flexible,                         transoral; with biopsy, single or multiple Diagnosis Code(s):     --- Professional ---                        R10.13, Epigastric pain                        Z98.0, Intestinal bypass and anastomosis status                        K22.89, Other specified disease of esophagus CPT copyright 2022 American Medical Association. All rights reserved. The codes documented in this report are preliminary and upon coder review may  be revised to meet current compliance requirements. Efrain Sella MD, MD 05/18/2022 2:20:04 PM This report has been signed electronically. Number of Addenda: 0 Note Initiated On: 05/18/2022 1:51 PM Estimated Blood Loss:  Estimated blood loss: none.      [facility]

## 2022-05-18 NOTE — H&P (Signed)
Outpatient short stay form Pre-procedure 05/18/2022 1:53 PM Linda Ortiz K. Norma Fredrickson, M.D.  Primary Physician: Daniel Nones III, M.D.  Reason for visit:  GERD, epigastric pain  History of present illness:  [name] reports about 2 months ago developing lower esophageal epigastric burning, reports it comes and goes in intensity but is persistent daily. Can be worse with eating at times but not always. She can tolerate some spicy foods okay but can worsen after food such as cheese toast or green beans. She has nausea with the symptoms slightly worse than her baseline chronic nausea that she relates to stress. She started Prilosec OTC 20 mg and it helped the symptoms but on day 4 began vomiting immediately after taking the Prilosec. She does take Tums which helps the symptoms. She denies any dysphagia.     Current Facility-Administered Medications:    0.9 %  sodium chloride infusion, , Intravenous, Continuous, Kastiel Simonian, Boykin Nearing, MD, Last Rate: 20 mL/hr at 05/18/22 1353, Continued from Pre-op at 05/18/22 1353  Medications Prior to Admission  Medication Sig Dispense Refill Last Dose   albuterol (PROVENTIL) (2.5 MG/3ML) 0.083% nebulizer solution Take 3 mLs (2.5 mg total) by nebulization every 4 (four) hours as needed for wheezing or shortness of breath. 75 mL 0 Past Month   SPIRONOLACTONE PO Take by mouth. Patient unsure about milligrams.   05/17/2022   venlafaxine XR (EFFEXOR-XR) 150 MG 24 hr capsule TAKE 1 CAPSULE BY MOUTH EVERY DAY 30 capsule 5 05/17/2022   warfarin (COUMADIN) 3 MG tablet Take 3 mg by mouth daily.   05/10/2022   azithromycin (ZITHROMAX) 250 MG tablet Take 1 tablet (250 mg total) by mouth daily. (Patient not taking: Reported on 05/18/2022) 4 each 0 Completed Course   chlorpheniramine-HYDROcodone (TUSSIONEX PENNKINETIC ER) 10-8 MG/5ML SUER Take 5 mLs by mouth 2 (two) times daily. 70 mL 0    Cholecalciferol (VITAMIN D3) 25 MCG (1000 UT) CAPS Take by mouth.      lamoTRIgine (LAMICTAL) 200 MG  tablet Take 1 tablet (200 mg total) by mouth daily. 30 tablet 5    levonorgestrel (MIRENA) 20 MCG/24HR IUD 1 each by Intrauterine route once. Mirena inserted 09/10/2019      ondansetron (ZOFRAN ODT) 4 MG disintegrating tablet Take 1 tablet (4 mg total) by mouth every 8 (eight) hours as needed for nausea or vomiting. 20 tablet 0    predniSONE (DELTASONE) 20 MG tablet 3 tablets daily x 4 days 12 tablet 0    UNABLE TO FIND Use as directed in the mouth or throat daily. Vit D 12 3000 2 tablets in the morning      vitamin B-12 (CYANOCOBALAMIN) 100 MCG tablet Take 100 mcg by mouth daily.        Allergies  Allergen Reactions   Morphine And Related Itching     Past Medical History:  Diagnosis Date   Anxiety    Blood clotting disorder (HCC)    Complication of anesthesia    Depression    Diverticulitis    Dysmenorrhea    Endometriosis    Hematoma of right lower extremity    Menorrhagia    Morbid obesity (HCC)    Pancreatitis, acute    PCOS (polycystic ovarian syndrome)    Pneumonia    PONV (postoperative nausea and vomiting)    General Anesthesia   Rib fracture    Left     Review of systems:  Otherwise negative.    Physical Exam  Gen: Alert, oriented. Appears stated age.  HEENT: [place]/AT. PERRLA. Lungs: CTA, no wheezes. CV: RR nl S1, S2. Abd: soft, benign, no masses. BS+ Ext: No edema. Pulses 2+    Planned procedures: Proceed with EGD. The patient understands the nature of the planned procedure, indications, risks, alternatives and potential complications including but not limited to bleeding, infection, perforation, damage to internal organs and possible oversedation/side effects from anesthesia. The patient agrees and gives consent to proceed.  Please refer to procedure notes for findings, recommendations and patient disposition/instructions.     Claude Swendsen K. Norma Fredrickson, M.D. Gastroenterology 05/18/2022  1:53 PM

## 2022-05-18 NOTE — Transfer of Care (Signed)
Immediate Anesthesia Transfer of Care Note  Patient: Linda Ortiz  Procedure(s) Performed: ESOPHAGOGASTRODUODENOSCOPY (EGD)  Patient Location: PACU  Anesthesia Type:General  Level of Consciousness: sedated  Airway & Oxygen Therapy: Patient Spontanous Breathing and Patient connected to nasal cannula oxygen  Post-op Assessment: Report given to RN and Post -op Vital signs reviewed and stable  Post vital signs: Reviewed and stable  Last Vitals:  Vitals Value Taken Time  BP    Temp    Pulse    Resp    SpO2      Last Pain:  Vitals:   05/18/22 1322  TempSrc: Temporal         Complications: No notable events documented.

## 2022-05-18 NOTE — Anesthesia Preprocedure Evaluation (Addendum)
Anesthesia Evaluation  Patient identified by MRN, date of birth, ID band Patient awake    Reviewed: Allergy & Precautions, NPO status , Patient's Chart, lab work & pertinent test results  History of Anesthesia Complications (+) PONV and history of anesthetic complications  Airway Mallampati: III  TM Distance: >3 FB Neck ROM: full    Dental  (+) Teeth Intact   Pulmonary sleep apnea , Current Smoker Hx of PE on warfarin, held for 5 day prior to procedure    Pulmonary exam normal        Cardiovascular negative cardio ROS Normal cardiovascular exam     Neuro/Psych  PSYCHIATRIC DISORDERS Anxiety Depression    negative neurological ROS     GI/Hepatic negative GI ROS, Neg liver ROS,,,  Endo/Other  negative endocrine ROS    Renal/GU negative Renal ROS  negative genitourinary   Musculoskeletal   Abdominal   Peds  Hematology negative hematology ROS (+)   Anesthesia Other Findings Past Medical History: No date: Anxiety No date: Blood clotting disorder (HCC) No date: Complication of anesthesia No date: Depression No date: Diverticulitis No date: Dysmenorrhea No date: Endometriosis No date: Hematoma of right lower extremity No date: Menorrhagia No date: Morbid obesity (HCC) No date: Pancreatitis, acute No date: PCOS (polycystic ovarian syndrome) No date: Pneumonia No date: PONV (postoperative nausea and vomiting)     Comment:  General Anesthesia No date: Rib fracture     Comment:  Left   Past Surgical History: 2005: APPENDECTOMY No date: ARTHROSCOPY KNEE W/ DRILLING 2007: BARIATRIC SURGERY 09/11/2020: BREAST BIOPSY; Right     Comment:  "Affirm" bx-RIGHT asymmetry-path pending 10/19/2015: CHOLECYSTECTOMY; N/A     Comment:  Procedure: LAPAROSCOPIC CHOLECYSTECTOMY ;  Surgeon:               Leafy Ro, MD;  Location: [facility] ORS;  Service:               General;  Laterality: N/A; 06/03/2016: COLONOSCOPY WITH  PROPOFOL; N/A     Comment:  Procedure: COLONOSCOPY WITH PROPOFOL;  Surgeon: Christena Deem, MD;  Location: [facility] ENDOSCOPY;  Service:               Endoscopy;  Laterality: N/A; 06/03/2016: ESOPHAGOGASTRODUODENOSCOPY (EGD) WITH PROPOFOL; N/A     Comment:  Procedure: ESOPHAGOGASTRODUODENOSCOPY (EGD) WITH               PROPOFOL;  Surgeon: Christena Deem, MD;  Location:               [facility] ENDOSCOPY;  Service: Endoscopy;  Laterality: N/A; 2017: EYE SURGERY  BMI    Body Mass Index: 59.13 kg/m      Reproductive/Obstetrics negative OB ROS                              Anesthesia Physical Anesthesia Plan  ASA: 3  Anesthesia Plan: General   Post-op Pain Management: Minimal or no pain anticipated   Induction: Intravenous  PONV Risk Score and Plan: Propofol infusion and TIVA  Airway Management Planned: Natural Airway and Nasal Cannula  Additional Equipment:   Intra-op Plan:   Post-operative Plan:   Informed Consent: I have reviewed the patients History and Physical, chart, labs and discussed the procedure including the risks, benefits and alternatives for the proposed anesthesia with the patient or authorized representative who has indicated  his/her understanding and acceptance.     Dental Advisory Given  Plan Discussed with: Anesthesiologist, CRNA and Surgeon  Anesthesia Plan Comments: (Patient consented for risks of anesthesia including but not limited to:  - adverse reactions to medications - risk of airway placement if required - damage to eyes, teeth, lips or other oral mucosa - nerve damage due to positioning  - sore throat or hoarseness - Damage to heart, brain, nerves, lungs, other parts of body or loss of life  Patient voiced understanding.)         Anesthesia Quick Evaluation

## 2022-05-19 ENCOUNTER — Encounter: Payer: Self-pay | Admitting: Internal Medicine

## 2022-05-19 NOTE — Anesthesia Postprocedure Evaluation (Signed)
Anesthesia Post Note  Patient: Linda Ortiz  Procedure(s) Performed: ESOPHAGOGASTRODUODENOSCOPY (EGD)  Patient location during evaluation: PACU Anesthesia Type: General Level of consciousness: awake and alert Pain management: pain level controlled Vital Signs Assessment: post-procedure vital signs reviewed and stable Respiratory status: spontaneous breathing, nonlabored ventilation and respiratory function stable Cardiovascular status: blood pressure returned to baseline and stable Postop Assessment: no apparent nausea or vomiting Anesthetic complications: no   No notable events documented.   Last Vitals:  Vitals:   05/18/22 1428 05/18/22 1438  BP: 123/68 122/66  Pulse: 78 76  Resp:  18  Temp:    SpO2: 100% 100%    Last Pain:  Vitals:   05/18/22 1438  TempSrc:   PainSc: 0-No pain                 Foye Deer

## 2022-05-20 LAB — SURGICAL PATHOLOGY

## 2022-05-28 ENCOUNTER — Other Ambulatory Visit (HOSPITAL_COMMUNITY): Payer: Self-pay | Admitting: Psychiatry

## 2022-06-20 ENCOUNTER — Ambulatory Visit
Admission: RE | Admit: 2022-06-20 | Discharge: 2022-06-20 | Disposition: A | Discharge status: Home / Self Care | Payer: BC Managed Care – PPO | Source: Ambulatory Visit | Attending: Internal Medicine | Admitting: Internal Medicine

## 2022-06-20 ENCOUNTER — Other Ambulatory Visit: Payer: Self-pay | Admitting: Internal Medicine

## 2022-06-20 DIAGNOSIS — M79662 Pain in left lower leg: Secondary | ICD-10-CM | POA: Diagnosis present

## 2022-06-20 DIAGNOSIS — M7989 Other specified soft tissue disorders: Secondary | ICD-10-CM | POA: Diagnosis present

## 2022-06-30 ENCOUNTER — Ambulatory Visit: Payer: BC Managed Care – PPO | Attending: Otolaryngology

## 2022-06-30 DIAGNOSIS — G4733 Obstructive sleep apnea (adult) (pediatric): Secondary | ICD-10-CM | POA: Insufficient documentation

## 2022-07-04 ENCOUNTER — Other Ambulatory Visit (HOSPITAL_COMMUNITY): Payer: Self-pay | Admitting: Psychiatry

## 2022-10-31 ENCOUNTER — Other Ambulatory Visit (HOSPITAL_COMMUNITY): Payer: Self-pay | Admitting: Psychiatry

## 2022-10-31 MED ORDER — VENLAFAXINE HCL ER 150 MG PO CP24: ORAL_CAPSULE | ORAL | 2 refills | Status: AC

## 2022-10-31 MED ORDER — LAMOTRIGINE 200 MG PO TABS: 200.0000 mg | ORAL_TABLET | Freq: Every day | ORAL | 1 refills | Status: AC

## 2022-10-31 NOTE — Progress Notes (Signed)
Client provided with Rx (bridge) and notification of needing a new provider as this one has left the outpatient practice.  Recommendations provided.  Nanine Means, PMHNP

## 2022-11-07 ENCOUNTER — Emergency Department: Payer: BC Managed Care – PPO

## 2022-11-07 ENCOUNTER — Inpatient Hospital Stay
Admission: EM | Admit: 2022-11-07 | Discharge: 2022-11-13 | DRG: 191 | Disposition: A | Discharge status: Home / Self Care | Payer: BC Managed Care – PPO | Attending: Obstetrics and Gynecology | Admitting: Obstetrics and Gynecology

## 2022-11-07 ENCOUNTER — Other Ambulatory Visit: Payer: Self-pay

## 2022-11-07 ENCOUNTER — Inpatient Hospital Stay: Payer: BC Managed Care – PPO

## 2022-11-07 ENCOUNTER — Encounter: Payer: Self-pay | Admitting: Internal Medicine

## 2022-11-07 DIAGNOSIS — X58XXXA Exposure to other specified factors, initial encounter: Secondary | ICD-10-CM | POA: Diagnosis not present

## 2022-11-07 DIAGNOSIS — S0990XA Unspecified injury of head, initial encounter: Secondary | ICD-10-CM | POA: Diagnosis not present

## 2022-11-07 DIAGNOSIS — Z79899 Other long term (current) drug therapy: Secondary | ICD-10-CM

## 2022-11-07 DIAGNOSIS — F32A Depression, unspecified: Secondary | ICD-10-CM | POA: Diagnosis present

## 2022-11-07 DIAGNOSIS — E559 Vitamin D deficiency, unspecified: Secondary | ICD-10-CM | POA: Diagnosis present

## 2022-11-07 DIAGNOSIS — Z9884 Bariatric surgery status: Secondary | ICD-10-CM

## 2022-11-07 DIAGNOSIS — E538 Deficiency of other specified B group vitamins: Secondary | ICD-10-CM | POA: Diagnosis present

## 2022-11-07 DIAGNOSIS — Z885 Allergy status to narcotic agent status: Secondary | ICD-10-CM

## 2022-11-07 DIAGNOSIS — Y92239 Unspecified place in hospital as the place of occurrence of the external cause: Secondary | ICD-10-CM | POA: Diagnosis not present

## 2022-11-07 DIAGNOSIS — Z91199 Patient's noncompliance with other medical treatment and regimen due to unspecified reason: Secondary | ICD-10-CM

## 2022-11-07 DIAGNOSIS — Z1152 Encounter for screening for COVID-19: Secondary | ICD-10-CM | POA: Diagnosis not present

## 2022-11-07 DIAGNOSIS — Z8701 Personal history of pneumonia (recurrent): Secondary | ICD-10-CM

## 2022-11-07 DIAGNOSIS — D689 Coagulation defect, unspecified: Secondary | ICD-10-CM | POA: Diagnosis present

## 2022-11-07 DIAGNOSIS — Z975 Presence of (intrauterine) contraceptive device: Secondary | ICD-10-CM

## 2022-11-07 DIAGNOSIS — R0602 Shortness of breath: Secondary | ICD-10-CM | POA: Diagnosis present

## 2022-11-07 DIAGNOSIS — Z9049 Acquired absence of other specified parts of digestive tract: Secondary | ICD-10-CM

## 2022-11-07 DIAGNOSIS — J441 Chronic obstructive pulmonary disease with (acute) exacerbation: Secondary | ICD-10-CM | POA: Diagnosis present

## 2022-11-07 DIAGNOSIS — Z8249 Family history of ischemic heart disease and other diseases of the circulatory system: Secondary | ICD-10-CM

## 2022-11-07 DIAGNOSIS — Z6841 Body Mass Index (BMI) 40.0 and over, adult: Secondary | ICD-10-CM | POA: Diagnosis not present

## 2022-11-07 DIAGNOSIS — Z7901 Long term (current) use of anticoagulants: Secondary | ICD-10-CM

## 2022-11-07 DIAGNOSIS — J440 Chronic obstructive pulmonary disease with acute lower respiratory infection: Secondary | ICD-10-CM | POA: Diagnosis present

## 2022-11-07 DIAGNOSIS — E282 Polycystic ovarian syndrome: Secondary | ICD-10-CM | POA: Diagnosis present

## 2022-11-07 DIAGNOSIS — R6884 Jaw pain: Secondary | ICD-10-CM | POA: Diagnosis not present

## 2022-11-07 DIAGNOSIS — Z86718 Personal history of other venous thrombosis and embolism: Secondary | ICD-10-CM

## 2022-11-07 DIAGNOSIS — J209 Acute bronchitis, unspecified: Secondary | ICD-10-CM | POA: Diagnosis present

## 2022-11-07 DIAGNOSIS — F17210 Nicotine dependence, cigarettes, uncomplicated: Secondary | ICD-10-CM | POA: Diagnosis present

## 2022-11-07 DIAGNOSIS — G4733 Obstructive sleep apnea (adult) (pediatric): Secondary | ICD-10-CM | POA: Diagnosis present

## 2022-11-07 DIAGNOSIS — Z793 Long term (current) use of hormonal contraceptives: Secondary | ICD-10-CM

## 2022-11-07 DIAGNOSIS — F419 Anxiety disorder, unspecified: Secondary | ICD-10-CM | POA: Diagnosis present

## 2022-11-07 DIAGNOSIS — Z716 Tobacco abuse counseling: Secondary | ICD-10-CM | POA: Diagnosis not present

## 2022-11-07 DIAGNOSIS — B9789 Other viral agents as the cause of diseases classified elsewhere: Secondary | ICD-10-CM | POA: Diagnosis present

## 2022-11-07 LAB — CBC
HCT: 44.8 % (ref 36.0–46.0)
Hemoglobin: 15.1 g/dL — ABNORMAL HIGH (ref 12.0–15.0)
MCH: 36.6 pg — ABNORMAL HIGH (ref 26.0–34.0)
MCHC: 33.7 g/dL (ref 30.0–36.0)
MCV: 108.5 fL — ABNORMAL HIGH (ref 80.0–100.0)
Platelets: 188 10*3/uL (ref 150–400)
RBC: 4.13 MIL/uL (ref 3.87–5.11)
RDW: 12.5 % (ref 11.5–15.5)
WBC: 6.4 10*3/uL (ref 4.0–10.5)
nRBC: 0 % (ref 0.0–0.2)

## 2022-11-07 LAB — COMPREHENSIVE METABOLIC PANEL
ALT: 28 U/L (ref 0–44)
AST: 39 U/L (ref 15–41)
Albumin: 3.3 g/dL — ABNORMAL LOW (ref 3.5–5.0)
Alkaline Phosphatase: 77 U/L (ref 38–126)
Anion gap: 11 (ref 5–15)
BUN: 6 mg/dL (ref 6–20)
CO2: 24 mmol/L (ref 22–32)
Calcium: 8.6 mg/dL — ABNORMAL LOW (ref 8.9–10.3)
Chloride: 99 mmol/L (ref 98–111)
Creatinine, Ser: 0.86 mg/dL (ref 0.44–1.00)
GFR, Estimated: >60 mL/min (ref >60)
Glucose, Bld: 104 mg/dL — ABNORMAL HIGH (ref 70–99)
Potassium: 4.2 mmol/L (ref 3.5–5.1)
Sodium: 134 mmol/L — ABNORMAL LOW (ref 135–145)
Total Bilirubin: 1.6 mg/dL — ABNORMAL HIGH (ref 0.3–1.2)
Total Protein: 6 g/dL — ABNORMAL LOW (ref 6.5–8.1)

## 2022-11-07 LAB — BLOOD GAS, VENOUS
Acid-Base Excess: 0.8 mmol/L (ref 0.0–2.0)
Bicarbonate: 24.2 mmol/L (ref 20.0–28.0)
O2 Saturation: 72 %
Patient temperature: 37
pCO2, Ven: 34 mmHg — ABNORMAL LOW (ref 44–60)
pH, Ven: 7.46 — ABNORMAL HIGH (ref 7.25–7.43)
pO2, Ven: 41 mmHg (ref 32–45)

## 2022-11-07 LAB — PROTIME-INR
INR: 1.2 (ref 0.8–1.2)
Prothrombin Time: 15.7 seconds — ABNORMAL HIGH (ref 11.4–15.2)

## 2022-11-07 LAB — BRAIN NATRIURETIC PEPTIDE: B Natriuretic Peptide: 59.1 pg/mL (ref 0.0–100.0)

## 2022-11-07 MED ORDER — LEVALBUTEROL HCL 1.25 MG/0.5ML IN NEBU
1.2500 mg | INHALATION_SOLUTION | Freq: Once | RESPIRATORY_TRACT | Status: DC
  Filled 2022-11-07: qty 0.5

## 2022-11-07 MED ORDER — IOHEXOL 350 MG/ML SOLN
75.0000 mL | Freq: Once | INTRAVENOUS | Status: AC | PRN
  Administered 2022-11-07: 75 mL via INTRAVENOUS

## 2022-11-07 MED ORDER — WARFARIN SODIUM 4 MG PO TABS
4.0000 mg | ORAL_TABLET | Freq: Once | ORAL | Status: AC
  Administered 2022-11-07: 4 mg via ORAL
  Filled 2022-11-07: qty 1

## 2022-11-07 MED ORDER — ONDANSETRON HCL 4 MG/2ML IJ SOLN: 4.0000 mg | Freq: Four times a day (QID) | INTRAMUSCULAR | Status: DC | PRN

## 2022-11-07 MED ORDER — GUAIFENESIN ER 600 MG PO TB12
600.0000 mg | ORAL_TABLET | Freq: Two times a day (BID) | ORAL | Status: DC
  Administered 2022-11-07 – 2022-11-13 (×12): 600 mg via ORAL
  Filled 2022-11-07 (×12): qty 1

## 2022-11-07 MED ORDER — IPRATROPIUM-ALBUTEROL 0.5-2.5 (3) MG/3ML IN SOLN
RESPIRATORY_TRACT | Status: AC
  Filled 2022-11-07: qty 6

## 2022-11-07 MED ORDER — HYDROCOD POLI-CHLORPHE POLI ER 10-8 MG/5ML PO SUER
5.0000 mL | Freq: Once | ORAL | Status: AC
  Administered 2022-11-07: 5 mL via ORAL
  Filled 2022-11-07 (×2): qty 5

## 2022-11-07 MED ORDER — ONDANSETRON HCL 4 MG PO TABS: 4.0000 mg | ORAL_TABLET | Freq: Four times a day (QID) | ORAL | Status: DC | PRN

## 2022-11-07 MED ORDER — PREDNISONE 20 MG PO TABS
40.0000 mg | ORAL_TABLET | Freq: Every day | ORAL | Status: AC
  Administered 2022-11-09 – 2022-11-12 (×4): 40 mg via ORAL
  Filled 2022-11-07 (×4): qty 2

## 2022-11-07 MED ORDER — PREDNISONE 50 MG PO TABS: 50.0000 mg | ORAL_TABLET | Freq: Every day | ORAL | 0 refills | Status: DC

## 2022-11-07 MED ORDER — SODIUM CHLORIDE 0.9 % IV SOLN
500.0000 mg | INTRAVENOUS | Status: AC
  Administered 2022-11-07: 500 mg via INTRAVENOUS
  Filled 2022-11-07 (×2): qty 5

## 2022-11-07 MED ORDER — ACETAMINOPHEN 325 MG PO TABS
650.0000 mg | ORAL_TABLET | Freq: Four times a day (QID) | ORAL | Status: DC | PRN
  Administered 2022-11-08 – 2022-11-12 (×11): 650 mg via ORAL
  Filled 2022-11-07 (×11): qty 2

## 2022-11-07 MED ORDER — ACETAMINOPHEN 650 MG RE SUPP: 650.0000 mg | Freq: Four times a day (QID) | RECTAL | Status: DC | PRN

## 2022-11-07 MED ORDER — IPRATROPIUM-ALBUTEROL 0.5-2.5 (3) MG/3ML IN SOLN
3.0000 mL | Freq: Four times a day (QID) | RESPIRATORY_TRACT | Status: DC
  Administered 2022-11-07 – 2022-11-08 (×3): 3 mL via RESPIRATORY_TRACT
  Filled 2022-11-07 (×2): qty 3

## 2022-11-07 MED ORDER — LEVALBUTEROL HCL 0.63 MG/3ML IN NEBU: 0.6300 mg | INHALATION_SOLUTION | Freq: Four times a day (QID) | RESPIRATORY_TRACT | Status: DC | PRN

## 2022-11-07 MED ORDER — IPRATROPIUM-ALBUTEROL 0.5-2.5 (3) MG/3ML IN SOLN
9.0000 mL | Freq: Once | RESPIRATORY_TRACT | Status: AC
  Administered 2022-11-07: 9 mL via RESPIRATORY_TRACT
  Filled 2022-11-07: qty 3

## 2022-11-07 MED ORDER — WARFARIN - PHARMACIST DOSING INPATIENT: Freq: Every day | Status: DC

## 2022-11-07 MED ORDER — METHYLPREDNISOLONE SODIUM SUCC 40 MG IJ SOLR
40.0000 mg | Freq: Two times a day (BID) | INTRAMUSCULAR | Status: AC
  Administered 2022-11-07 – 2022-11-08 (×2): 40 mg via INTRAVENOUS
  Filled 2022-11-07 (×2): qty 1

## 2022-11-07 MED ORDER — AZITHROMYCIN 500 MG PO TABS
500.0000 mg | ORAL_TABLET | Freq: Every day | ORAL | Status: AC
  Administered 2022-11-08 – 2022-11-11 (×4): 500 mg via ORAL
  Filled 2022-11-07 (×4): qty 1

## 2022-11-07 NOTE — Progress Notes (Signed)
ANTICOAGULATION CONSULT NOTE - Initial Consult  Pharmacy Consult for warfarin Indication: history of DVT  Allergies  Allergen Reactions   Morphine And Codeine Itching    Patient Measurements:    Vital Signs: Temp: 98.1 F (36.7 C) (05/27 1820) Temp Source: Oral (05/27 1820) BP: 133/85 (05/27 1820) Pulse Rate: 92 (05/27 1820)  Labs: Recent Labs    11/07/22 1434  HGB 15.1*  HCT 44.8  PLT 188  CREATININE 0.86    CrCl cannot be calculated (Unknown ideal weight.).   Medical History: Past Medical History:  Diagnosis Date   Anxiety    Blood clotting disorder (HCC)    Complication of anesthesia    Depression    Diverticulitis    Dysmenorrhea    Endometriosis    Hematoma of right lower extremity    Menorrhagia    Morbid obesity (HCC)    Pancreatitis, acute    PCOS (polycystic ovarian syndrome)    Pneumonia    PONV (postoperative nausea and vomiting)    General Anesthesia   Rib fracture    Left     Medications:  Warfarin 3 mg every Mon, Tues, Thurs, Friday, Saturday Warfarin 6 mg every Sun, Wed  Assessment: 52 year old female admitted with acute exacerbation of COPD versus acute bronchitis. 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   Goal of Therapy:  INR 2-3 Monitor platelets by anticoagulation protocol: Yes   Plan: INR 1.2. Last dose of warfarin 5/26 (yesterday) Will give warfarin 4 mg tonight  INR tomorrow AM  Linda Ortiz 11/07/2022,7:09 PM

## 2022-11-07 NOTE — ED Provider Notes (Signed)
-----------------------------------------   5:21 PM on 11/07/2022 -----------------------------------------   Vitals:   11/07/22 1425 11/07/22 1659  BP: 133/80 128/70  Pulse: 77 96  Resp: (!) 22 (!) 22  Temp: 98.6 F (37 C)   SpO2: 97% 97%    Patient completed extensive albuterol treatment.  She was able to get up and ambulate but after short distance became dyspneic saturation about 92 to 93% on room air with labored breathing.  Patient does not feel comfortable home but is more comfortable at rest now.  She is not in acute distress but has moderate expiratory wheezing throughout.  She is fully awake alert and.  Discussed with the patient and in the context of her prolonged exacerbation of what appears to be COPD plan to admit for further observation and treatment.  Patient very comfortable with this plan of care.  Consulted with our hospitalist, patient accepted to the service of Dr. Pollie Friar, MD 11/07/22 1734

## 2022-11-07 NOTE — ED Provider Notes (Signed)
[facility] Provider Note    Event Date/Time   First MD Initiated Contact with Patient 11/07/22 1415     (approximate)   History   Shortness of Breath   HPI  Linda Ortiz is a 52 y.o. female past medical history significant for obesity, tobacco use, presents to the emergency department with shortness of breath.  Endorses 3 days of progressively worsening shortness of breath.  Friday woke up with a fever and a cough.  Progressively worsening since that time.  Messaged her primary care physician and was started on cefpodoxime mean which she took 3 doses of -emesis with 1 dose on Saturday but states that she was able to take both doses yesterday.  Change in voice with a scratchy throat that started today.  Denies any ongoing nausea or vomiting.  No chest pain.  Fever yesterday of 100.8.  No dysuria, urinary urgency or frequency.  No history of DVT or PE.  Ongoing tobacco use.  Prior hospitalizations for pneumonia in the past but has not been hospitalized in the past couple of years.  Received IV Solu-Medrol 125 mg with EMS.   Physical Exam   Triage Vital Signs: ED Triage Vitals  Enc Vitals Group     BP      Pulse      Resp      Temp      Temp src      SpO2      Weight      Height      Head Circumference      Peak Flow      Pain Score      Pain Loc      Pain Edu?      Excl. in GC?     Most recent vital signs: Vitals:   11/07/22 1425  BP: 133/80  Pulse: 77  Resp: (!) 22  Temp: 98.6 F (37 C)  SpO2: 97%    Physical Exam Constitutional:      Appearance: She is well-developed.  HENT:     Head: Atraumatic.     Mouth/Throat:     Mouth: Mucous membranes are moist.     Pharynx: Oropharynx is clear.     Tonsils: No tonsillar exudate.     Comments: Hoarse voice, no cervical lymphadenopathy, speaking in full sentences Eyes:     Conjunctiva/sclera: Conjunctivae normal.  Cardiovascular:     Rate and Rhythm: Regular rhythm.  Pulmonary:      Effort: Tachypnea and respiratory distress present.     Breath sounds: Wheezing present.  Abdominal:     General: There is no distension.  Musculoskeletal:        General: Normal range of motion.     Cervical back: Normal range of motion.     Right lower leg: No edema.     Left lower leg: No edema.  Skin:    General: Skin is warm.  Neurological:     Mental Status: She is alert. Mental status is at baseline.     IMPRESSION / MDM / ASSESSMENT AND PLAN / ED COURSE  I reviewed the triage vital signs and the nursing notes.  Differential diagnosis including community-acquired pneumonia, viral illness, COPD exacerbation, anemia, obesity hypoventilation syndrome, pulmonary hypertension, have a low suspicion for pulmonary embolism, low risk Wells criteria.  Patient received 125 mg Solu-Medrol with EMS  RADIOLOGY I independently reviewed imaging, my interpretation of imaging: Chest x-ray -with no focal findings of pneumonia.  Read as no acute findings.  LABS (all labs ordered are listed, but only abnormal results are displayed) Labs interpreted as -    Labs Reviewed  CBC - Abnormal; Notable for the following components:      Result Value   Hemoglobin 15.1 (*)    MCV 108.5 (*)    MCH 36.6 (*)    All other components within normal limits  COMPREHENSIVE METABOLIC PANEL - Abnormal; Notable for the following components:   Sodium 134 (*)    Glucose, Bld 104 (*)    Calcium 8.6 (*)    Total Protein 6.0 (*)    Albumin 3.3 (*)    Total Bilirubin 1.6 (*)    All other components within normal limits     MDM    Clinical picture is most concerning for acute bronchitis likely from viral illness.  Given DuoNeb treatments and received IV Solu-Medrol with EMS.  Elevated hemoglobin level which is likely in the setting of tobacco use.  Creatinine at her baseline with no significant electrolyte abnormalities.  Plan for DuoNeb treatment and will ambulate on pulse ox, clinical picture is most  consistent with acute bronchitis likely in the setting of a viral illness.  Discussed continuing on her antibiotic.  If able to ambulate with no hypoxia or respiratory distress plan for discharge home with steroids otherwise will admit for acute hypoxia in the setting of a COPD exacerbation    PROCEDURES:  Critical Care performed: No  Procedures  Patient's presentation is most consistent with acute presentation with potential threat to life or bodily function.   MEDICATIONS ORDERED IN ED: Medications  ipratropium-albuterol (DUONEB) 0.5-2.5 (3) MG/3ML nebulizer solution 9 mL (9 mLs Nebulization Not Given 11/07/22 1558)    FINAL CLINICAL IMPRESSION(S) / ED DIAGNOSES   Final diagnoses:  SOB (shortness of breath)  COPD exacerbation (HCC)     Rx / DC Orders   ED Discharge Orders          Ordered    predniSONE (DELTASONE) 50 MG tablet  Daily with breakfast        11/07/22 1608             Note:  This document was prepared using Dragon voice recognition software and may include unintentional dictation errors.   Corena Herter, MD 11/07/22 1610

## 2022-11-07 NOTE — ED Notes (Signed)
Pt ambulated about 500 ft, lowest O2 sat went to 92% for brief time but maintained at around 95%. Pt had very labored breathing and 1 episode of coughing.

## 2022-11-07 NOTE — ED Triage Notes (Signed)
Pt via EMS (unable to drive when she became shaky). Pt given ABX for sinus infection three days ago but has not noticed improvement, increased SOB/ EMS reported right lower sounding rhonchi. EMS gave two duonebs, 125 mg of solu-medrol. No hx of COPD or asthma. Hx of PEs Vitals: BP 156/86 HR 76 NSR O2 Sat 94 % RA

## 2022-11-07 NOTE — H&P (Addendum)
History and Physical    MADDEX FICHTNER NFA:[id number] DOB: 04-25-1971 DOA: 11/07/2022  PCP: Lynnea Ferrier, MD  Patient coming from: home  I have personally briefly reviewed patient's old medical records in [facility]  Chief Complaint: sob x 10 days but worse over the last 3days   HPI: Linda Ortiz is a 52 y.o. female with medical history significant of  Anxiety,morbid obesity, PCOS, depression , PE/DVT on coumadin, tobacco abuse, OSA non compliant with cpap, gallstone pancreatitis s/p cholecystecomy, who presents to ED with 10 days of progressive sob with productive green sputum, sore throat, + fever/chills/ and pleuritic chest pain as well as persistent wheezing. Patient also endorses n/v/d in setting of starting antibiotic.  Patient was prescribed cefuroxime 3 days ago  by pcp for possible  for sinus infection. Patient current states she is still sob and still has severe cough.   ED Course:  IN ed patient was treated with nebs and attempt was made for discharge however on ambulation patient was noted to be very winded with decrease sat from resting baseline. Due to continue wheezing and notable DOE patient was slated for admission.   Afeb, bp 133/80, hr 77, rr 22, sat 97% Wbc 6.4, hgb 15.1, mcv 108, lt 188 Na 134, K4.2, CL 99 gly 104 cr 0.86 tibil1.6  cxr IMPRESSION: No active cardiopulmonary disease. Tx dounebs   Review of Systems: As per HPI otherwise 10 point review of systems negative.   Past Medical History:  Diagnosis Date   Anxiety    Blood clotting disorder (HCC)    Complication of anesthesia    Depression    Diverticulitis    Dysmenorrhea    Endometriosis    Hematoma of right lower extremity    Menorrhagia    Morbid obesity (HCC)    Pancreatitis, acute    PCOS (polycystic ovarian syndrome)    Pneumonia    PONV (postoperative nausea and vomiting)    General Anesthesia   Rib fracture    Left     Past Surgical History:  Procedure Laterality Date    APPENDECTOMY  2005   ARTHROSCOPY KNEE W/ DRILLING     BARIATRIC SURGERY  2007   BREAST BIOPSY Right 09/11/2020   "Affirm" bx-RIGHT asymmetry-path pending   CHOLECYSTECTOMY N/A 10/19/2015   Procedure: LAPAROSCOPIC CHOLECYSTECTOMY ;  Surgeon: Leafy Ro, MD;  Location: [facility] ORS;  Service: General;  Laterality: N/A;   COLONOSCOPY WITH PROPOFOL N/A 06/03/2016   Procedure: COLONOSCOPY WITH PROPOFOL;  Surgeon: Christena Deem, MD;  Location: [facility] ENDOSCOPY;  Service: Endoscopy;  Laterality: N/A;   ESOPHAGOGASTRODUODENOSCOPY N/A 05/18/2022   Procedure: ESOPHAGOGASTRODUODENOSCOPY (EGD);  Surgeon: Toledo, Boykin Nearing, MD;  Location: [facility] ENDOSCOPY;  Service: Gastroenterology;  Laterality: N/A;   ESOPHAGOGASTRODUODENOSCOPY (EGD) WITH PROPOFOL N/A 06/03/2016   Procedure: ESOPHAGOGASTRODUODENOSCOPY (EGD) WITH PROPOFOL;  Surgeon: Christena Deem, MD;  Location: [facility] ENDOSCOPY;  Service: Endoscopy;  Laterality: N/A;   EYE SURGERY  2017     reports that she has been smoking cigarettes. She has a 2.00 pack-year smoking history. She has never used smokeless tobacco. She reports current alcohol use. She reports that she does not use drugs.  Allergies  Allergen Reactions   Morphine And Codeine Itching    Family History  Problem Relation Age of Onset   Arthritis Mother        rheumatoid   Hypertension Mother    Hyperlipidemia Father    Prostate cancer Father    Mental  illness Maternal Grandmother        dementia   Alcohol abuse Maternal Grandmother    ALS Paternal Grandmother    Breast cancer Neg Hx     Prior to Admission medications   Medication Sig Start Date End Date Taking? Authorizing Provider  predniSONE (DELTASONE) 50 MG tablet Take 1 tablet (50 mg total) by mouth daily with breakfast for 5 days. 11/07/22 11/12/22 Yes Mumma, Carollee Herter, MD  albuterol (PROVENTIL) (2.5 MG/3ML) 0.083% nebulizer solution Take 3 mLs (2.5 mg total) by nebulization every 4 (four) hours as needed for wheezing or  shortness of breath. 05/05/21   Irean Hong, MD  azithromycin (ZITHROMAX) 250 MG tablet Take 1 tablet (250 mg total) by mouth daily. Patient not taking: Reported on 05/18/2022 05/05/21   Irean Hong, MD  chlorpheniramine-HYDROcodone [facility]) 10-8 MG/5ML SUER Take 5 mLs by mouth 2 (two) times daily. 05/05/21   Irean Hong, MD  Cholecalciferol (VITAMIN D3) 25 MCG (1000 UT) CAPS Take by mouth.    [provider]  lamoTRIgine (LAMICTAL) 200 MG tablet Take 1 tablet (200 mg total) by mouth daily. 10/31/22   Charm Rings, NP  levonorgestrel (MIRENA) 20 MCG/24HR IUD 1 each by Intrauterine route once. Mirena inserted 09/10/2019    [provider]  ondansetron (ZOFRAN ODT) 4 MG disintegrating tablet Take 1 tablet (4 mg total) by mouth every 8 (eight) hours as needed for nausea or vomiting. 10/08/20   Minna Antis, MD  SPIRONOLACTONE PO Take by mouth. Patient unsure about milligrams.    [provider]  UNABLE TO FIND Use as directed in the mouth or throat daily. Vit D 12 3000 2 tablets in the morning    [provider]  venlafaxine XR (EFFEXOR-XR) 150 MG 24 hr capsule TAKE 1 CAPSULE BY MOUTH DAILY 10/31/22   Charm Rings, NP  vitamin B-12 (CYANOCOBALAMIN) 100 MCG tablet Take 100 mcg by mouth daily.    [provider]  warfarin (COUMADIN) 3 MG tablet Take 3 mg by mouth daily.    [provider]    Physical Exam: Vitals:   11/07/22 1425 11/07/22 1659  BP: 133/80 128/70  Pulse: 77 96  Resp: (!) 22 (!) 22  Temp: 98.6 F (37 C)   TempSrc: Oral   SpO2: 97% 97%    Constitutional: NAD, calm, comfortable, hoarse voice Vitals:   11/07/22 1425 11/07/22 1659  BP: 133/80 128/70  Pulse: 77 96  Resp: (!) 22 (!) 22  Temp: 98.6 F (37 C)   TempSrc: Oral   SpO2: 97% 97%   Eyes: PERRL, lids and conjunctivae normal,  ENMT: Mucous membranes are moist. Posterior pharynx clear of any exudate or lesions.Normal dentition.  Neck:  normal, supple, no masses, no thyromegaly Respiratory: c+wheezing, no crackles. Normal respiratory effort. No accessory muscle use.  Cardiovascular: Regular rate and rhythm, no murmurs / rubs / gallops. No extremity edema. 2+ pedal pulses. No carotid bruits.  Abdomen: no tenderness, no masses palpated. No hepatosplenomegaly. Bowel sounds positive.  Musculoskeletal: no clubbing / cyanosis. No joint deformity upper and lower extremities. Good ROM, no contractures. Normal muscle tone.  Skin: no rashes, lesions, ulcers. No induration Neurologic: CN 2-12 grossly intact. Sensation intact, .MAEXl 4.  Psychiatric: Normal judgment and insight. Alert and oriented x 3. Normal mood.    Labs on Admission: I have personally reviewed following labs and imaging studies  CBC: Recent Labs  Lab 11/07/22 1434  WBC 6.4  HGB 15.1*  HCT 44.8  MCV 108.5*  PLT 188   Basic Metabolic Panel: Recent Labs  Lab 11/07/22 1434  NA 134*  K 4.2  CL 99  CO2 24  GLUCOSE 104*  BUN 6  CREATININE 0.86  CALCIUM 8.6*   GFR: CrCl cannot be calculated (Unknown ideal weight.). Liver Function Tests: Recent Labs  Lab 11/07/22 1434  AST 39  ALT 28  ALKPHOS 77  BILITOT 1.6*  PROT 6.0*  ALBUMIN 3.3*   No results for input(s): "LIPASE", "AMYLASE" in the last 168 hours. No results for input(s): "AMMONIA" in the last 168 hours. Coagulation Profile: No results for input(s): "INR", "PROTIME" in the last 168 hours. Cardiac Enzymes: No results for input(s): "CKTOTAL", "CKMB", "CKMBINDEX", "TROPONINI" in the last 168 hours. BNP (last 3 results) No results for input(s): "PROBNP" in the last 8760 hours. HbA1C: No results for input(s): "HGBA1C" in the last 72 hours. CBG: No results for input(s): "GLUCAP" in the last 168 hours. Lipid Profile: No results for input(s): "CHOL", "HDL", "LDLCALC", "TRIG", "CHOLHDL", "LDLDIRECT" in the last 72 hours. Thyroid Function Tests: No results for input(s): "TSH", "T4TOTAL",  "FREET4", "T3FREE", "THYROIDAB" in the last 72 hours. Anemia Panel: No results for input(s): "VITAMINB12", "FOLATE", "FERRITIN", "TIBC", "IRON", "RETICCTPCT" in the last 72 hours. Urine analysis:    Component Value Date/Time   COLORURINE COLORLESS (A) 10/08/2020 1641   APPEARANCEUR CLEAR (A) 10/08/2020 1641   LABSPEC 1.001 (L) 10/08/2020 1641   PHURINE 6.0 10/08/2020 1641   GLUCOSEU NEGATIVE 10/08/2020 1641   HGBUR NEGATIVE 10/08/2020 1641   BILIRUBINUR NEGATIVE 10/08/2020 1641   BILIRUBINUR neg 08/20/2019 1024   KETONESUR NEGATIVE 10/08/2020 1641   PROTEINUR NEGATIVE 10/08/2020 1641   UROBILINOGEN 0.2 08/20/2019 1024   NITRITE NEGATIVE 10/08/2020 1641   LEUKOCYTESUR SMALL (A) 10/08/2020 1641    Radiological Exams on Admission: DG Chest 2 View  Result Date: 11/07/2022 CLINICAL DATA:  Cough, fever EXAM: CHEST - 2 VIEW COMPARISON:  CT chest dated May 05, 2021 FINDINGS: The heart size and mediastinal contours are within normal limits. Both lungs are clear. The visualized skeletal structures are unremarkable. IMPRESSION: No active cardiopulmonary disease. Electronically Signed   By: Larose Hires D.O.   On: 11/07/2022 15:12    EKG: Independently reviewed. 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 Acute bronchitis- not formally diagnosed with COPD -admit to tele  - solumedrol x 1 day then prednisone taper per protocol -douneb standing and prn  -sputum culture/gram stain  -CT thorax due for further evaluation   OSA -non complaint with cpap  - will place on noctural O2   Hx DVT./PE -continue on coumadin per pharmacy protocol   Morbid Obesity   Anxiety Depression -resume home regimen    Tobacco abuse -encourage cessation  -tobacco education    DVT prophylaxis: on full dose anticoag Code Status: full/ as discussed per patient wishes in event of cardiac arrest  Family Communication: none at bedside Disposition Plan: patient  expected to be admitted greater than  2 midnights  Consults called: n/a Admission status: med tele   Lurline Del MD [facility]   If 7PM-7AM, please contact night-coverage [email or web address] Password [facility]  11/07/2022, 5:41 PM

## 2022-11-07 NOTE — Discharge Instructions (Addendum)
Advised to follow-up with primary care physician in 1 week. Advised to take prednisone 50 mg for 2 more days. Advised to continue DuoNeb nebulization every 6 hours as needed for wheezing

## 2022-11-08 DIAGNOSIS — J441 Chronic obstructive pulmonary disease with (acute) exacerbation: Secondary | ICD-10-CM | POA: Diagnosis not present

## 2022-11-08 LAB — RESPIRATORY PANEL BY PCR

## 2022-11-08 LAB — MAGNESIUM: Magnesium: 2.1 mg/dL (ref 1.7–2.4)

## 2022-11-08 LAB — FOLATE: Folate: 5.8 ng/mL — ABNORMAL LOW (ref >5.9)

## 2022-11-08 LAB — GLUCOSE, CAPILLARY
Glucose-Capillary: 133 mg/dL — ABNORMAL HIGH (ref 70–99)
Glucose-Capillary: 142 mg/dL — ABNORMAL HIGH (ref 70–99)

## 2022-11-08 LAB — PROTIME-INR
INR: 1.2 (ref 0.8–1.2)
Prothrombin Time: 15.7 seconds — ABNORMAL HIGH (ref 11.4–15.2)

## 2022-11-08 LAB — COMPREHENSIVE METABOLIC PANEL
ALT: 25 U/L (ref 0–44)
AST: 33 U/L (ref 15–41)
Albumin: 3.3 g/dL — ABNORMAL LOW (ref 3.5–5.0)
Alkaline Phosphatase: 69 U/L (ref 38–126)
Anion gap: 12 (ref 5–15)
BUN: 15 mg/dL (ref 6–20)
CO2: 21 mmol/L — ABNORMAL LOW (ref 22–32)
Calcium: 8.7 mg/dL — ABNORMAL LOW (ref 8.9–10.3)
Chloride: 100 mmol/L (ref 98–111)
Creatinine, Ser: 0.92 mg/dL (ref 0.44–1.00)
GFR, Estimated: >60 mL/min (ref >60)
Glucose, Bld: 220 mg/dL — ABNORMAL HIGH (ref 70–99)
Potassium: 3.7 mmol/L (ref 3.5–5.1)
Sodium: 133 mmol/L — ABNORMAL LOW (ref 135–145)
Total Bilirubin: 0.6 mg/dL (ref 0.3–1.2)
Total Protein: 6.2 g/dL — ABNORMAL LOW (ref 6.5–8.1)

## 2022-11-08 LAB — PHOSPHORUS: Phosphorus: 3.2 mg/dL (ref 2.5–4.6)

## 2022-11-08 LAB — CBC
HCT: 43.7 % (ref 36.0–46.0)
Hemoglobin: 14.9 g/dL (ref 12.0–15.0)
MCH: 36.9 pg — ABNORMAL HIGH (ref 26.0–34.0)
MCHC: 34.1 g/dL (ref 30.0–36.0)
MCV: 108.2 fL — ABNORMAL HIGH (ref 80.0–100.0)
Platelets: 208 10*3/uL (ref 150–400)
RBC: 4.04 MIL/uL (ref 3.87–5.11)
RDW: 12.3 % (ref 11.5–15.5)
WBC: 6.9 10*3/uL (ref 4.0–10.5)
nRBC: 0 % (ref 0.0–0.2)

## 2022-11-08 LAB — VITAMIN D 25 HYDROXY (VIT D DEFICIENCY, FRACTURES): Vit D, 25-Hydroxy: 10.23 ng/mL — ABNORMAL LOW (ref 30–100)

## 2022-11-08 LAB — HIV ANTIBODY (ROUTINE TESTING W REFLEX): HIV Screen 4th Generation wRfx: NONREACTIVE

## 2022-11-08 MED ORDER — LAMOTRIGINE 100 MG PO TABS
200.0000 mg | ORAL_TABLET | Freq: Every day | ORAL | Status: DC
  Administered 2022-11-08 – 2022-11-13 (×6): 200 mg via ORAL
  Filled 2022-11-08 (×6): qty 2

## 2022-11-08 MED ORDER — VENLAFAXINE HCL ER 75 MG PO CP24
150.0000 mg | ORAL_CAPSULE | Freq: Every day | ORAL | Status: DC
  Administered 2022-11-08 – 2022-11-13 (×6): 150 mg via ORAL
  Filled 2022-11-08 (×6): qty 2

## 2022-11-08 MED ORDER — BENZONATATE 100 MG PO CAPS
100.0000 mg | ORAL_CAPSULE | Freq: Three times a day (TID) | ORAL | Status: DC | PRN
  Filled 2022-11-08: qty 1

## 2022-11-08 MED ORDER — IPRATROPIUM-ALBUTEROL 0.5-2.5 (3) MG/3ML IN SOLN
3.0000 mL | RESPIRATORY_TRACT | Status: DC | PRN
  Administered 2022-11-08 – 2022-11-10 (×4): 3 mL via RESPIRATORY_TRACT
  Filled 2022-11-08 (×4): qty 3

## 2022-11-08 MED ORDER — HYDROCOD POLI-CHLORPHE POLI ER 10-8 MG/5ML PO SUER
5.0000 mL | Freq: Once | ORAL | Status: AC
  Administered 2022-11-08: 5 mL via ORAL
  Filled 2022-11-08: qty 5

## 2022-11-08 MED ORDER — CLONAZEPAM 0.5 MG PO TABS
0.5000 mg | ORAL_TABLET | Freq: Two times a day (BID) | ORAL | Status: DC | PRN
  Administered 2022-11-08 – 2022-11-12 (×7): 0.5 mg via ORAL
  Filled 2022-11-08 (×7): qty 1

## 2022-11-08 MED ORDER — FLUTICASONE FUROATE-VILANTEROL 200-25 MCG/ACT IN AEPB
1.0000 | INHALATION_SPRAY | Freq: Every day | RESPIRATORY_TRACT | Status: DC
  Administered 2022-11-08 – 2022-11-13 (×6): 1 via RESPIRATORY_TRACT
  Filled 2022-11-08: qty 28

## 2022-11-08 MED ORDER — HYDROCOD POLI-CHLORPHE POLI ER 10-8 MG/5ML PO SUER
5.0000 mL | Freq: Two times a day (BID) | ORAL | Status: DC | PRN
  Administered 2022-11-08 – 2022-11-12 (×7): 5 mL via ORAL
  Filled 2022-11-08 (×8): qty 5

## 2022-11-08 MED ORDER — MENTHOL 3 MG MT LOZG
2.0000 | LOZENGE | Freq: Once | OROMUCOSAL | Status: AC
  Administered 2022-11-08: 6 mg via ORAL
  Filled 2022-11-08: qty 9

## 2022-11-08 MED ORDER — CYANOCOBALAMIN 1000 MCG/ML IJ KIT: 1000.0000 ug | PACK | INTRAMUSCULAR | Status: DC

## 2022-11-08 MED ORDER — WARFARIN SODIUM 5 MG PO TABS
5.0000 mg | ORAL_TABLET | Freq: Once | ORAL | Status: AC
  Administered 2022-11-08: 5 mg via ORAL
  Filled 2022-11-08: qty 1

## 2022-11-08 MED ORDER — SPIRONOLACTONE 25 MG PO TABS
100.0000 mg | ORAL_TABLET | Freq: Every day | ORAL | Status: DC
  Administered 2022-11-08 – 2022-11-13 (×6): 100 mg via ORAL
  Filled 2022-11-08 (×6): qty 4

## 2022-11-08 MED ORDER — BENZONATATE 100 MG PO CAPS
100.0000 mg | ORAL_CAPSULE | Freq: Three times a day (TID) | ORAL | Status: AC
  Filled 2022-11-08: qty 1

## 2022-11-08 NOTE — Progress Notes (Signed)
[facility] Progress Note  Patient: Linda Ortiz    ZOX:[id number]  DOA: 11/07/2022     Date of Service: the patient was seen and examined on 11/08/2022  Chief Complaint  Patient presents with   Shortness of Breath   Brief hospital course: Linda Ortiz is a 52 y.o. female with medical history significant of  Anxiety,morbid obesity, PCOS, depression , PE/DVT on coumadin, tobacco abuse, OSA non compliant with cpap, gallstone pancreatitis s/p cholecystecomy, who presents to ED with 10 days of progressive sob with productive green sputum, sore throat, + fever/chills/ and pleuritic chest pain as well as persistent wheezing. Patient also endorses n/v/d in setting of starting antibiotic.  Patient was prescribed cefuroxime 3 days ago  by pcp for possible  for sinus infection. Patient current states she is still sob and still has severe cough. ED workup: Due to persistent shortness of breath, cough and dyspnea on exertion, O2 sats dropped on ambulation so patient was admitted under [facility] service. Afeb, bp 133/80, hr 77, rr 22, sat 97% Wbc 6.4, hgb 15.1, mcv 108, lt 188 Na 134, K4.2, CL 99 gly 104 cr 0.86 tibil1.6 CXR: No active cardiopulmonary disease.  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 daily Follow with PCP to repeat folate level after 3 to 6 months.  # OSA -non complaint with cpap  - will place on noctural O2    # Hx DVT./PE -continue on coumadin per pharmacy protocol     # Anxiety and Depression -resume home regimen      # Nicotine  dependence, patient smokes cigarettes daily.   Smoking cessation counseling done.    # Morbid Obesity Body mass index is 61.5 kg/m.  Interventions:       Diet: Heart healthy diet DVT Prophylaxis: Therapeutic Anticoagulation with Coumadin    Advance goals of care discussion: Full code  Family Communication: family was present at bedside, at the time of interview.  The pt provided permission to discuss medical plan with the family. Opportunity was given to ask question and all questions were answered satisfactorily.   Disposition:  Pt is from Home, admitted with DOE due to rhinovirus, still significant wheezing and shortness of breath, which precludes a safe discharge. Discharge to home, when clinically improves, may need 1-2 more days.  Subjective: No significant events overnight, patient is still having significant wheezing, productive cough.  Denies any chest pain or palpitations, no any other active issues.  Physical Exam: General: NAD, lying comfortably Appear in no distress, affect appropriate Eyes: PERRLA ENT: Oral Mucosa Clear, moist  Neck: no JVD,  Cardiovascular: S1 and S2 Present, no Murmur,  Respiratory: Good air entry bilaterally, mild bibasilar crackles, significant wheezing bilaterally Abdomen: Bowel Sound present, Soft and no tenderness,  Skin: no rashes Extremities: no Pedal edema, no calf tenderness Neurologic: without any new focal findings Gait not checked due to patient safety concerns  Vitals:   11/08/22 0434 11/08/22 0500 11/08/22 0755 11/08/22 0825  BP: 133/74   135/78  Pulse: 76  97 77  Resp: 18  18 19   Temp: 98.4 F (36.9 C)  TempSrc:      SpO2: 94%  95% 95%  Weight:  (!) 160 kg    Height:       No intake or output data in the 24 hours ending 11/08/22 1359 Filed Weights   11/07/22 1937 11/08/22 0500  Weight: (!) 160.5 kg (!) 160 kg    Data Reviewed: I have personally reviewed and interpreted daily labs, tele strips, imagings as  discussed above. I reviewed all nursing notes, pharmacy notes, vitals, pertinent old records I have discussed plan of care as described above with RN and patient/family.  CBC: Recent Labs  Lab 11/07/22 1434 11/08/22 0502  WBC 6.4 6.9  HGB 15.1* 14.9  HCT 44.8 43.7  MCV 108.5* 108.2*  PLT 188 208   Basic Metabolic Panel: Recent Labs  Lab 11/07/22 1434 11/08/22 0502 11/08/22 0856  NA 134* 133*  --   K 4.2 3.7  --   CL 99 100  --   CO2 24 21*  --   GLUCOSE 104* 220*  --   BUN 6 15  --   CREATININE 0.86 0.92  --   CALCIUM 8.6* 8.7*  --   MG  --   --  2.1  PHOS  --   --  3.2    Studies: CT Angio Chest Pulmonary Embolism (PE) W or WO Contrast  Result Date: 11/07/2022 CLINICAL DATA:  Worsening shortness of breath. EXAM: CT ANGIOGRAPHY CHEST WITH CONTRAST TECHNIQUE: Multidetector CT imaging of the chest was performed using the standard protocol during bolus administration of intravenous contrast. Multiplanar CT image reconstructions and MIPs were obtained to evaluate the vascular anatomy. RADIATION DOSE REDUCTION: This exam was performed according to the departmental dose-optimization program which includes automated exposure control, adjustment of the mA and/or kV according to patient size and/or use of iterative reconstruction technique. CONTRAST:  75mL OMNIPAQUE IOHEXOL 350 MG/ML SOLN COMPARISON:  May 05, 2021 FINDINGS: Cardiovascular: Satisfactory opacification of the pulmonary arteries to the segmental level. No evidence of pulmonary embolism. Normal heart size. No pericardial effusion. Mediastinum/Nodes: No enlarged mediastinal, hilar, or axillary lymph nodes. Thyroid gland, trachea, and esophagus demonstrate no significant findings. Lungs/Pleura: Lungs are clear. No pleural effusion or pneumothorax. Upper Abdomen: No acute abnormality. Musculoskeletal: No chest wall abnormality. No acute or significant osseous findings. Review of the MIP images confirms the above findings.  IMPRESSION: 1. No evidence of pulmonary embolism. 2. No active cardiopulmonary disease. Electronically Signed   By: Ted Mcalpine M.D.   On: 11/07/2022 19:23   DG Chest 2 View  Result Date: 11/07/2022 CLINICAL DATA:  Cough, fever EXAM: CHEST - 2 VIEW COMPARISON:  CT chest dated May 05, 2021 FINDINGS: The heart size and mediastinal contours are within normal limits. Both lungs are clear. The visualized skeletal structures are unremarkable. IMPRESSION: No active cardiopulmonary disease. Electronically Signed   By: Larose Hires D.O.   On: 11/07/2022 15:12    Scheduled Meds:  azithromycin  500 mg Oral Daily   benzonatate  100 mg Oral TID   fluticasone furoate-vilanterol  1 puff Inhalation Daily   guaiFENesin  600 mg Oral BID   levalbuterol  1.25 mg Nebulization Once   [START ON 11/09/2022] predniSONE  40 mg Oral Q breakfast   warfarin  5 mg Oral ONCE-1600   Warfarin - Pharmacist Dosing Inpatient   Does not apply q1600   Continuous Infusions: PRN Meds: acetaminophen **OR** acetaminophen, benzonatate **FOLLOWED BY** [START ON 11/09/2022] benzonatate, chlorpheniramine-HYDROcodone, ipratropium-albuterol, levalbuterol, ondansetron **OR** ondansetron (ZOFRAN) IV  Time spent: 35 minutes  Author: Gillis Santa. MD [facility] 11/08/2022 1:59 PM  To reach On-call, see care teams to locate the attending and reach out to them via [email or web address]. If 7PM-7AM, please contact night-coverage If you still have difficulty reaching the attending provider, please page the [facility] (Director on Call) for [facility] on amion for assistance.

## 2022-11-08 NOTE — Progress Notes (Signed)
ANTICOAGULATION CONSULT NOTE -   Pharmacy Consult for warfarin Indication: history of DVT  Allergies  Allergen Reactions   Morphine And Codeine Itching    Patient Measurements: Height: 5' 3.5" (161.3 cm) Weight: (!) 160 kg (352 lb 11.8 oz) IBW/kg (Calculated) : 53.55  Vital Signs: Temp: 98.4 F (36.9 C) (05/28 0434) BP: 133/74 (05/28 0434) Pulse Rate: 76 (05/28 0434)  Labs: Recent Labs    11/07/22 1434 11/07/22 1841 11/08/22 0502  HGB 15.1*  --  14.9  HCT 44.8  --  43.7  PLT 188  --  208  LABPROT  --  15.7* 15.7*  INR  --  1.2 1.2  CREATININE 0.86  --  0.92     Estimated Creatinine Clearance: 111.1 mL/min (by C-G formula based on SCr of 0.92 mg/dL).   Medical History: Past Medical History:  Diagnosis Date   Anxiety    Blood clotting disorder (HCC)    Complication of anesthesia    Depression    Diverticulitis    Dysmenorrhea    Endometriosis    Hematoma of right lower extremity    Menorrhagia    Morbid obesity (HCC)    Pancreatitis, acute    PCOS (polycystic ovarian syndrome)    Pneumonia    PONV (postoperative nausea and vomiting)    General Anesthesia   Rib fracture    Left     Medications:  Warfarin 3 mg every Mon, Tues, Thurs, Friday, Saturday Warfarin 6 mg every Sun, Wed  Last dose of warfarin 5/26   Assessment: 52 year old female admitted with acute exacerbation of COPD versus acute bronchitis. 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  5/27 INR 1.2 Warfarin 4 mg (given @2201 ) 5/28 INR 1.2  DDI: Azithromycin, (also on steroids)  Goal of Therapy:  INR 2-3 Monitor platelets by anticoagulation protocol: Yes   Plan: INR 1.2 Will order warfarin 5 mg x1 today  INR daily CBC per protocol  Alexzia Kasler A 11/08/2022,7:46 AM

## 2022-11-09 DIAGNOSIS — J441 Chronic obstructive pulmonary disease with (acute) exacerbation: Secondary | ICD-10-CM | POA: Diagnosis not present

## 2022-11-09 LAB — CBC
HCT: 45.3 % (ref 36.0–46.0)
Hemoglobin: 15 g/dL (ref 12.0–15.0)
MCH: 36.6 pg — ABNORMAL HIGH (ref 26.0–34.0)
MCHC: 33.1 g/dL (ref 30.0–36.0)
MCV: 110.5 fL — ABNORMAL HIGH (ref 80.0–100.0)
Platelets: 229 10*3/uL (ref 150–400)
RBC: 4.1 MIL/uL (ref 3.87–5.11)
RDW: 12.6 % (ref 11.5–15.5)
WBC: 11.2 10*3/uL — ABNORMAL HIGH (ref 4.0–10.5)
nRBC: 0 % (ref 0.0–0.2)

## 2022-11-09 LAB — BASIC METABOLIC PANEL
Anion gap: 9 (ref 5–15)
BUN: 18 mg/dL (ref 6–20)
CO2: 25 mmol/L (ref 22–32)
Calcium: 8.8 mg/dL — ABNORMAL LOW (ref 8.9–10.3)
Chloride: 104 mmol/L (ref 98–111)
Creatinine, Ser: 0.93 mg/dL (ref 0.44–1.00)
GFR, Estimated: >60 mL/min (ref >60)
Glucose, Bld: 97 mg/dL (ref 70–99)
Potassium: 4.2 mmol/L (ref 3.5–5.1)
Sodium: 138 mmol/L (ref 135–145)

## 2022-11-09 LAB — PROTIME-INR
INR: 1.2 (ref 0.8–1.2)
Prothrombin Time: 15.7 seconds — ABNORMAL HIGH (ref 11.4–15.2)

## 2022-11-09 LAB — GLUCOSE, CAPILLARY: Glucose-Capillary: 101 mg/dL — ABNORMAL HIGH (ref 70–99)

## 2022-11-09 MED ORDER — WARFARIN SODIUM 7.5 MG PO TABS
7.5000 mg | ORAL_TABLET | Freq: Once | ORAL | Status: AC
  Administered 2022-11-09: 7.5 mg via ORAL
  Filled 2022-11-09: qty 1

## 2022-11-09 MED ORDER — VITAMIN D (ERGOCALCIFEROL) 1.25 MG (50000 UNIT) PO CAPS
50000.0000 [IU] | ORAL_CAPSULE | ORAL | Status: DC
  Administered 2022-11-09: 50000 [IU] via ORAL
  Filled 2022-11-09: qty 1

## 2022-11-09 NOTE — Progress Notes (Signed)
[facility] Progress Note  Patient: Linda Ortiz    ZOX:[id number]  DOA: 11/07/2022     Date of Service: the patient was seen and examined on 11/09/2022  Chief Complaint  Patient presents with   Shortness of Breath   Brief hospital course: ALMEE LOCKLIN is a 52 y.o. female with medical history significant of  Anxiety,morbid obesity, PCOS, depression , PE/DVT on coumadin, tobacco abuse, OSA non compliant with cpap, gallstone pancreatitis s/p cholecystecomy, who presents to ED with 10 days of progressive sob with productive green sputum, sore throat, + fever/chills/ and pleuritic chest pain as well as persistent wheezing. Patient also endorses n/v/d in setting of starting antibiotic.  Patient was prescribed cefuroxime 3 days ago  by pcp for possible  for sinus infection. Patient current states she is still sob and still has severe cough. ED workup: Due to persistent shortness of breath, cough and dyspnea on exertion, O2 sats dropped on ambulation so patient was admitted under [facility] service. Afeb, bp 133/80, hr 77, rr 22, sat 97% Wbc 6.4, hgb 15.1, mcv 108, lt 188 Na 134, K4.2, CL 99 gly 104 cr 0.86 tibil1.6 CXR: No active cardiopulmonary disease.  Assessment and Plan:  # AECOPD vs severe Acute bronchitis secondary to rhinovirus Patient is not formally diagnosed with COPD, but she is a active cigarette smoker RVP positive for rhinovirus CTA chest negative for PE Continue symptomatic treatment S/p Solu-Medrol IV, continue prednisone taper Continue Xopenex nebulizer prn Started Breo Ellipta inhaler Continue supplemental O2 nation as needed  # Head trauma, accidental in the bathroom on 5/29 No LOC, ice was applied. RN was advised to monitor closely and if notice any mental status changes then we can do a CT head to rule out ICH as patient is on Coumadin.  # Folic acid deficiency, folate level 5.8, started folic acid 1 mg p.o. daily Follow with PCP to repeat folate level after 3  to 6 months. # Vitamin D deficiency: started vitamin D 50,000 units p.o. weekly, follow with PCP to repeat vitamin D level after 3 to 6 months.  # OSA -non complaint with cpap, continue noctural O2 as needed   # Hx DVT./PE -continue on coumadin per pharmacy protocol     # Anxiety and Depression -resume home regimen     # Nicotine dependence, patient smokes cigarettes daily.   Smoking cessation counseling done.    # Morbid Obesity Body mass index is 61.5 kg/m.  Interventions:       Diet: Heart healthy diet DVT Prophylaxis: Therapeutic Anticoagulation with Coumadin    Advance goals of care discussion: Full code  Family Communication: family was present at bedside, at the time of interview.  The pt provided permission to discuss medical plan with the family. Opportunity was given to ask question and all questions were answered satisfactorily.   Disposition:  Pt is from Home, admitted with DOE due to rhinovirus, still significant wheezing and shortness of breath, which precludes a safe discharge. Discharge to home, when clinically improves, may need 1-2 more days.  Subjective: No significant events overnight, patient is still having significant cough and phlegm production, still wheezing a lot.  Feels little bit improvement with lozenges.  Denies any headache or dizziness, no chest pain.  No any other active issues.  Physical Exam: General: NAD, lying comfortably Appear in no distress, affect appropriate Eyes: PERRLA ENT: Oral Mucosa Clear, moist  Neck: no JVD,  Cardiovascular: S1 and S2 Present, no Murmur,  Respiratory: Good  air entry bilaterally, mild bibasilar crackles, significant wheezing bilaterally Abdomen: Bowel Sound present, Soft and no tenderness,  Skin: no rashes Extremities: no Pedal edema, no calf tenderness Neurologic: without any new focal findings Gait not checked due to patient safety concerns  Vitals:   11/08/22 1939 11/09/22 0337 11/09/22 0502  11/09/22 0722  BP: 133/85  130/67 (!) 145/85  Pulse: 74  64 92  Resp: 19  19 18   Temp: 98.1 F (36.7 C)  (!) 97.5 F (36.4 C) (!) 97.4 F (36.3 C)  TempSrc: Oral  Oral Oral  SpO2: 96%  97% 96%  Weight:  (!) 161.6 kg    Height:       No intake or output data in the 24 hours ending 11/09/22 1427 Filed Weights   11/07/22 1937 11/08/22 0500 11/09/22 0337  Weight: (!) 160.5 kg (!) 160 kg (!) 161.6 kg    Data Reviewed: I have personally reviewed and interpreted daily labs, tele strips, imagings as discussed above. I reviewed all nursing notes, pharmacy notes, vitals, pertinent old records I have discussed plan of care as described above with RN and patient/family.  CBC: Recent Labs  Lab 11/07/22 1434 11/08/22 0502 11/09/22 0433  WBC 6.4 6.9 11.2*  HGB 15.1* 14.9 15.0  HCT 44.8 43.7 45.3  MCV 108.5* 108.2* 110.5*  PLT 188 208 229   Basic Metabolic Panel: Recent Labs  Lab 11/07/22 1434 11/08/22 0502 11/08/22 0856 11/09/22 0433  NA 134* 133*  --  138  K 4.2 3.7  --  4.2  CL 99 100  --  104  CO2 24 21*  --  25  GLUCOSE 104* 220*  --  97  BUN 6 15  --  18  CREATININE 0.86 0.92  --  0.93  CALCIUM 8.6* 8.7*  --  8.8*  MG  --   --  2.1  --   PHOS  --   --  3.2  --     Studies: No results found.  Scheduled Meds:  azithromycin  500 mg Oral Daily   fluticasone furoate-vilanterol  1 puff Inhalation Daily   guaiFENesin  600 mg Oral BID   lamoTRIgine  200 mg Oral Daily   predniSONE  40 mg Oral Q breakfast   spironolactone  100 mg Oral Daily   venlafaxine XR  150 mg Oral Q breakfast   Vitamin D (Ergocalciferol)  50,000 Units Oral Q7 days   warfarin  7.5 mg Oral ONCE-1600   Warfarin - Pharmacist Dosing Inpatient   Does not apply q1600   Continuous Infusions: PRN Meds: acetaminophen **OR** acetaminophen, [EXPIRED] benzonatate **FOLLOWED BY** benzonatate, chlorpheniramine-HYDROcodone, clonazePAM, ipratropium-albuterol, levalbuterol, ondansetron **OR** ondansetron  (ZOFRAN) IV  Time spent: 35 minutes  Author: Gillis Santa. MD [facility] 11/09/2022 2:27 PM  To reach On-call, see care teams to locate the attending and reach out to them via [email or web address]. If 7PM-7AM, please contact night-coverage If you still have difficulty reaching the attending provider, please page the [facility] (Director on Call) for [facility] on amion for assistance.

## 2022-11-09 NOTE — Progress Notes (Addendum)
ANTICOAGULATION CONSULT NOTE -   Pharmacy Consult for warfarin Indication: history of DVT  Allergies  Allergen Reactions   Morphine And Codeine Itching    Patient Measurements: Height: 5' 3.5" (161.3 cm) Weight: (!) 161.6 kg (356 lb 4.2 oz) IBW/kg (Calculated) : 53.55  Vital Signs: Temp: 97.4 F (36.3 C) (05/29 0722) Temp Source: Oral (05/29 0722) BP: 145/85 (05/29 0722) Pulse Rate: 92 (05/29 0722)  Labs: Recent Labs    11/07/22 1434 11/07/22 1841 11/08/22 0502 11/09/22 0433  HGB 15.1*  --  14.9 15.0  HCT 44.8  --  43.7 45.3  PLT 188  --  208 229  LABPROT  --  15.7* 15.7* 15.7*  INR  --  1.2 1.2 1.2  CREATININE 0.86  --  0.92 0.93     Estimated Creatinine Clearance: 110.6 mL/min (by C-G formula based on SCr of 0.93 mg/dL).   Medical History: Past Medical History:  Diagnosis Date   Anxiety    Blood clotting disorder (HCC)    Complication of anesthesia    Depression    Diverticulitis    Dysmenorrhea    Endometriosis    Hematoma of right lower extremity    Menorrhagia    Morbid obesity (HCC)    Pancreatitis, acute    PCOS (polycystic ovarian syndrome)    Pneumonia    PONV (postoperative nausea and vomiting)    General Anesthesia   Rib fracture    Left     Medications:  Warfarin 3 mg every Mon, Tues, Thurs, Friday, Saturday Warfarin 6 mg every Sun, Wed  Last dose of warfarin 5/26   Assessment: 52 year old female admitted with acute exacerbation of COPD versus acute bronchitis. 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  5/27 INR 1.2 Warfarin 4 mg  5/28 INR 1.2 Warfarin 5 mg  5/29 INR 1.2 Warfarin 7.5 mg   DDI: Azithromycin, (also on steroids)  Goal of Therapy:  INR 2-3 Monitor platelets by anticoagulation protocol: Yes   Plan:  INR is subtherapeutic and not trending up. Will give warfarin 7.5 mg x 1 tonight in hopes INR will trend up (~25% increase from home dose). Daily INR. CBC at least every 3 days.   Discharge  recommendation: Continue pt's home regimen:  Warfarin 3 mg every Mon, Tues, Thurs, Friday, Saturday Warfarin 6 mg every Sun, Wed  Ronnald Ramp, PharmD, BCPS 11/09/2022,8:36 AM

## 2022-11-09 NOTE — TOC Initial Note (Signed)
Transition of Care [facility]) - Initial/Assessment Note    Patient Details  Name: Linda Ortiz MRN: [id number] Date of Birth: 1971-01-11  Transition [facility]) CM/SW Contact:    Allena Katz, LCSW Phone Number: 11/09/2022, 2:19 PM  Clinical Narrative:      CSW spoke with patient regarding care at home and additional home needs. Pt reports she is fully independent at home and does not have care needs at this time. Pt states she is not interested in home health or any discharge services. Pt is active with Dr Graciela Husbands for primary care and reports no additional discharge needs.        Expected Discharge Plan: Home/Self Care     Patient Goals and CMS Choice Patient states their goals for this hospitalization and ongoing recovery are:: return home.          Expected Discharge Plan and Services                                              Prior Living Arrangements/Services   Lives with:: Self          Need for Family Participation in Patient Care: No (Comment)     Criminal Activity/Legal Involvement Pertinent to Current Situation/Hospitalization: No - Comment as needed  Activities of Daily Living Home Assistive Devices/Equipment: None ADL Screening (condition at time of admission) Patient's cognitive ability adequate to safely complete daily activities?: Yes Is the patient deaf or have difficulty hearing?: No Does the patient have difficulty seeing, even when wearing glasses/contacts?: No Does the patient have difficulty concentrating, remembering, or making decisions?: No Patient able to express need for assistance with ADLs?: Yes Does the patient have difficulty dressing or bathing?: No Independently performs ADLs?: Yes (appropriate for developmental age) Does the patient have difficulty walking or climbing stairs?: No Weakness of Legs: None Weakness of Arms/Hands: None  Permission Sought/Granted                  Emotional Assessment Appearance::  Appears stated age     Orientation: : Oriented to Self, Oriented to Place, Oriented to  Time, Oriented to Situation Alcohol / Substance Use: Never Used    Admission diagnosis:  SOB (shortness of breath) [R06.02] COPD exacerbation (HCC) [J44.1] Patient Active Problem List   Diagnosis Date Noted   COPD exacerbation (HCC) 11/07/2022   Pneumonia 06/07/2017   OSA (obstructive sleep apnea) 03/01/2016   BMI 60.0-69.9, adult (HCC) 02/29/2016   Acute gallstone pancreatitis    Depression 10/14/2015   Pancreatitis, acute 10/14/2015   Gallstones 10/14/2015   AKI (acute kidney injury) (HCC) 10/14/2015   Diverticulitis large intestine 08/25/2015   Diverticulitis of large intestine with perforation without bleeding    Morbid obesity with BMI of 70 and over, adult (HCC) 12/05/2014   Menorrhagia 12/05/2014   Dysmenorrhea 12/05/2014   PCOS (polycystic ovarian syndrome) 12/05/2014   Endometriosis 12/05/2014   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   Entropion, 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   Routine general medical examination at a health care facility 05/05/2013   Elevated blood pressure reading without diagnosis of hypertension 05/05/2013   Pain of both elbows 05/05/2013   Cyst of joint of ankle or foot 03/23/2013   Vitamin D deficiency 07/11/2012   Status post gastric bypass for obesity 07/11/2012   Generalized anxiety disorder    PCP:  Lynnea Ferrier, MD  Pharmacy:   [id number] [place], [place] - [address] [place] [address] Phone: [phone] Fax: [phone]  [facility] [place], [place] - [facility] AT Portal to Registered [address] [place] [address] Phone: [phone] Fax: [phone]  [facility] [place] [place] - [address] [place] [address] Phone: [phone] Fax: [phone]     Social Determinants of Health (SDOH) Social  History: SDOH Screenings   Food Insecurity: No Food Insecurity (11/07/2022)  Housing: Low Risk  (11/07/2022)  Transportation Needs: No Transportation Needs (11/07/2022)  Utilities: Not At Risk (11/07/2022)  Tobacco Use: High Risk (11/07/2022)   SDOH Interventions:     Readmission Risk Interventions    11/09/2022    2:18 PM  Readmission Risk Prevention Plan  Post Dischage Appt Complete  Medication Screening Complete  Transportation Screening Complete

## 2022-11-10 DIAGNOSIS — J441 Chronic obstructive pulmonary disease with (acute) exacerbation: Secondary | ICD-10-CM | POA: Diagnosis not present

## 2022-11-10 LAB — PROTIME-INR
INR: 1.3 — ABNORMAL HIGH (ref 0.8–1.2)
Prothrombin Time: 16.8 seconds — ABNORMAL HIGH (ref 11.4–15.2)

## 2022-11-10 LAB — GLUCOSE, CAPILLARY: Glucose-Capillary: 73 mg/dL (ref 70–99)

## 2022-11-10 MED ORDER — PHENOL 1.4 % MT LIQD
1.0000 | OROMUCOSAL | Status: DC | PRN
  Filled 2022-11-10: qty 177

## 2022-11-10 MED ORDER — WARFARIN SODIUM 7.5 MG PO TABS
7.5000 mg | ORAL_TABLET | Freq: Once | ORAL | Status: AC
  Administered 2022-11-10: 7.5 mg via ORAL
  Filled 2022-11-10: qty 1

## 2022-11-10 MED ORDER — KETOROLAC TROMETHAMINE 30 MG/ML IJ SOLN
30.0000 mg | Freq: Once | INTRAMUSCULAR | Status: AC
  Administered 2022-11-10: 30 mg via INTRAVENOUS
  Filled 2022-11-10: qty 1

## 2022-11-10 NOTE — Progress Notes (Signed)
[facility] Progress Note  Patient: Linda Ortiz    NWG:[id number]  DOA: 11/07/2022     Date of Service: the patient was seen and examined on 11/10/2022  Chief Complaint  Patient presents with   Shortness of Breath   Brief hospital course: ADYA EFFLER is a 52 y.o. female with medical history significant of  Anxiety,morbid obesity, PCOS, depression , PE/DVT on coumadin, tobacco abuse, OSA non compliant with cpap, gallstone pancreatitis s/p cholecystecomy, who presents to ED with 10 days of progressive sob with productive green sputum, sore throat, + fever/chills/ and pleuritic chest pain as well as persistent wheezing. Patient also endorses n/v/d in setting of starting antibiotic.  Patient was prescribed cefuroxime 3 days ago  by pcp for possible  for sinus infection. Patient current states she is still sob and still has severe cough. ED workup: Due to persistent shortness of breath, cough and dyspnea on exertion, O2 sats dropped on ambulation so patient was admitted under [facility] service. Afeb, bp 133/80, hr 77, rr 22, sat 97% Wbc 6.4, hgb 15.1, mcv 108, lt 188 Na 134, K4.2, CL 99 gly 104 cr 0.86 tibil1.6 CXR: No active cardiopulmonary disease.  Assessment and Plan:  # AECOPD vs severe Acute bronchitis secondary to rhinovirus Patient is not formally diagnosed with COPD, but she is a active cigarette smoker RVP positive for rhinovirus CTA chest negative for PE Continue symptomatic treatment S/p Solu-Medrol IV, continue prednisone taper Continue DuoNeb prn Started Breo Ellipta inhaler Continue supplemental O2 nation as needed 5/30 c/o pain in the jaw and generalized body ache, Toradol 30 mg IV x once ordered  # Head trauma, accidental in the bathroom on 5/29 No LOC, ice was applied. RN was advised to monitor closely and if notice any mental status changes then we can do a CT head to rule out ICH as patient is on Coumadin.  # Folic acid deficiency, folate level 5.8, started  folic acid 1 mg p.o. daily Follow with PCP to repeat folate level after 3 to 6 months. # Vitamin D deficiency: started vitamin D 50,000 units p.o. weekly, follow with PCP to repeat vitamin D level after 3 to 6 months.  # OSA -non complaint with cpap, continue noctural O2 as needed   # Hx DVT./PE -continue on coumadin per pharmacy protocol     # Anxiety and Depression -resume home regimen     # Nicotine dependence, patient smokes cigarettes daily.   Smoking cessation counseling done.    # Morbid Obesity Body mass index is 61.5 kg/m.  Interventions:       Diet: Heart healthy diet DVT Prophylaxis: Therapeutic Anticoagulation with Coumadin    Advance goals of care discussion: Full code  Family Communication: family was not present at bedside, at the time of interview.  The pt provided permission to discuss medical plan with the family. Opportunity was given to ask question and all questions were answered satisfactorily.   Disposition:  Pt is from Home, admitted with DOE due to rhinovirus, still significant wheezing and shortness of breath, which precludes a safe discharge. Discharge to home, when clinically improves, may discharge tomorrow a.m.  Subjective: No significant events overnight, patient still having significant wheezing and cough, complaining of pain in her jaw and generalized body ache possible secondary to excessive coughing.  Denies any chest pain or palpitation.  Physical Exam: General: NAD, lying comfortably Appear in no distress, affect appropriate Eyes: PERRLA ENT: Oral Mucosa Clear, moist  Neck: no JVD,  Cardiovascular: S1 and S2 Present, no Murmur,  Respiratory: Good air entry bilaterally, No crackles, mild to moderate wheezing bilaterally, improved as compared to yesterday Abdomen: Bowel Sound present, Soft and no tenderness,  Skin: no rashes Extremities: no Pedal edema, no calf tenderness Neurologic: without any new focal findings Gait not checked  due to patient safety concerns  Vitals:   11/09/22 2026 11/10/22 0450 11/10/22 0700 11/10/22 0756  BP: 123/72 122/75  120/86  Pulse: 71 68  (!) 56  Resp: 19 18  18   Temp: 98 F (36.7 C) (!) 97.5 F (36.4 C)  97.9 F (36.6 C)  TempSrc: Oral Oral    SpO2: 97% 97%  97%  Weight:   (!) 161.4 kg   Height:       No intake or output data in the 24 hours ending 11/10/22 1358 Filed Weights   11/08/22 0500 11/09/22 0337 11/10/22 0700  Weight: (!) 160 kg (!) 161.6 kg (!) 161.4 kg    Data Reviewed: I have personally reviewed and interpreted daily labs, tele strips, imagings as discussed above. I reviewed all nursing notes, pharmacy notes, vitals, pertinent old records I have discussed plan of care as described above with RN and patient/family.  CBC: Recent Labs  Lab 11/07/22 1434 11/08/22 0502 11/09/22 0433  WBC 6.4 6.9 11.2*  HGB 15.1* 14.9 15.0  HCT 44.8 43.7 45.3  MCV 108.5* 108.2* 110.5*  PLT 188 208 229   Basic Metabolic Panel: Recent Labs  Lab 11/07/22 1434 11/08/22 0502 11/08/22 0856 11/09/22 0433  NA 134* 133*  --  138  K 4.2 3.7  --  4.2  CL 99 100  --  104  CO2 24 21*  --  25  GLUCOSE 104* 220*  --  97  BUN 6 15  --  18  CREATININE 0.86 0.92  --  0.93  CALCIUM 8.6* 8.7*  --  8.8*  MG  --   --  2.1  --   PHOS  --   --  3.2  --     Studies: No results found.  Scheduled Meds:  azithromycin  500 mg Oral Daily   fluticasone furoate-vilanterol  1 puff Inhalation Daily   guaiFENesin  600 mg Oral BID   lamoTRIgine  200 mg Oral Daily   predniSONE  40 mg Oral Q breakfast   spironolactone  100 mg Oral Daily   venlafaxine XR  150 mg Oral Q breakfast   Vitamin D (Ergocalciferol)  50,000 Units Oral Q7 days   warfarin  7.5 mg Oral ONCE-1600   Warfarin - Pharmacist Dosing Inpatient   Does not apply q1600   Continuous Infusions: PRN Meds: acetaminophen **OR** acetaminophen, [EXPIRED] benzonatate **FOLLOWED BY** benzonatate, chlorpheniramine-HYDROcodone,  clonazePAM, ipratropium-albuterol, levalbuterol, ondansetron **OR** ondansetron (ZOFRAN) IV  Time spent: 35 minutes  Author: Gillis Santa. MD [facility] 11/10/2022 1:58 PM  To reach On-call, see care teams to locate the attending and reach out to them via [email or web address]. If 7PM-7AM, please contact night-coverage If you still have difficulty reaching the attending provider, please page the [facility] (Director on Call) for [facility] on amion for assistance.

## 2022-11-10 NOTE — Progress Notes (Signed)
ANTICOAGULATION CONSULT NOTE  Pharmacy Consult for warfarin Indication: history of DVT  Allergies  Allergen Reactions   Morphine And Codeine Itching    Patient Measurements: Height: 5' 3.5" (161.3 cm) Weight: (!) 161.4 kg (355 lb 13.2 oz) IBW/kg (Calculated) : 53.55  Vital Signs: Temp: 97.5 F (36.4 C) (05/30 0450) Temp Source: Oral (05/30 0450) BP: 122/75 (05/30 0450) Pulse Rate: 68 (05/30 0450)  Labs: Recent Labs    11/07/22 1434 11/07/22 1841 11/08/22 0502 11/09/22 0433 11/10/22 0423  HGB 15.1*  --  14.9 15.0  --   HCT 44.8  --  43.7 45.3  --   PLT 188  --  208 229  --   LABPROT  --    < > 15.7* 15.7* 16.8*  INR  --    < > 1.2 1.2 1.3*  CREATININE 0.86  --  0.92 0.93  --    < > = values in this interval not displayed.     Estimated Creatinine Clearance: 110.5 mL/min (by C-G formula based on SCr of 0.93 mg/dL).   Medical History: Past Medical History:  Diagnosis Date   Anxiety    Blood clotting disorder (HCC)    Complication of anesthesia    Depression    Diverticulitis    Dysmenorrhea    Endometriosis    Hematoma of right lower extremity    Menorrhagia    Morbid obesity (HCC)    Pancreatitis, acute    PCOS (polycystic ovarian syndrome)    Pneumonia    PONV (postoperative nausea and vomiting)    General Anesthesia   Rib fracture    Left     Medications:  Warfarin 3 mg every Mon, Tues, Thurs, Friday, Saturday Warfarin 6 mg every Sun, Wed  Last dose of warfarin 5/26   Assessment: 52 year old female admitted with acute exacerbation of COPD versus acute bronchitis. 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  5/27 INR 1.2 Warfarin 4 mg  5/28 INR 1.2 Warfarin 5 mg  5/29 INR 1.2 Warfarin 7.5 mg  5/30 INR 1.3 Warfarin 7.5 mg  DDI: Azithromycin, (also on steroids)  Goal of Therapy:  INR 2-3 Monitor platelets by anticoagulation protocol: Yes   Plan:  INR is subtherapeutic with a slow trend up. Will give warfarin 7.5 mg x 1 tonight  in hopes INR will trend up (~25% increase from home dose). Daily INR. CBC at least every 3 days.   Discharge recommendation: Continue pt's home regimen:  Warfarin 3 mg every Mon, Tues, Thurs, Friday, Saturday Warfarin 6 mg every Sun, Wed  Ronnald Ramp, PharmD, BCPS 11/10/2022,7:35 AM

## 2022-11-11 DIAGNOSIS — J441 Chronic obstructive pulmonary disease with (acute) exacerbation: Secondary | ICD-10-CM | POA: Diagnosis not present

## 2022-11-11 LAB — GLUCOSE, CAPILLARY
Glucose-Capillary: 77 mg/dL (ref 70–99)
Glucose-Capillary: 89 mg/dL (ref 70–99)

## 2022-11-11 LAB — PROTIME-INR
INR: 1.6 — ABNORMAL HIGH (ref 0.8–1.2)
Prothrombin Time: 19 seconds — ABNORMAL HIGH (ref 11.4–15.2)

## 2022-11-11 MED ORDER — WARFARIN SODIUM 6 MG PO TABS
6.0000 mg | ORAL_TABLET | Freq: Once | ORAL | Status: AC
  Administered 2022-11-11: 6 mg via ORAL
  Filled 2022-11-11: qty 1

## 2022-11-11 NOTE — Progress Notes (Signed)
[facility] Progress Note  Patient: Linda Ortiz    ZOX:[id number]  DOA: 11/07/2022     Date of Service: the patient was seen and examined on 11/11/2022  Chief Complaint  Patient presents with   Shortness of Breath   Brief hospital course: Linda Ortiz is a 52 y.o. female with medical history significant of  Anxiety,morbid obesity, PCOS, depression , PE/DVT on coumadin, tobacco abuse, OSA non compliant with cpap, gallstone pancreatitis s/p cholecystecomy, who presents to ED with 10 days of progressive sob with productive green sputum, sore throat, + fever/chills/ and pleuritic chest pain as well as persistent wheezing. Patient also endorses n/v/d in setting of starting antibiotic.  Patient was prescribed cefuroxime 3 days ago  by pcp for possible  for sinus infection. Patient current states she is still sob and still has severe cough. ED workup: Due to persistent shortness of breath, cough and dyspnea on exertion, O2 sats dropped on ambulation so patient was admitted under [facility] service. Afeb, bp 133/80, hr 77, rr 22, sat 97% Wbc 6.4, hgb 15.1, mcv 108, lt 188 Na 134, K4.2, CL 99 gly 104 cr 0.86 tibil1.6 CXR: No active cardiopulmonary disease.  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 Will order PT eval, told patient assuming that goes relatively well she should plan on discharge tomorrow  # Folic acid deficiency, folate level 5.8, started folic acid 1 mg p.o. daily Follow with PCP to repeat folate level after 3 to 6 months.  # Vitamin D deficiency: started vitamin D 50,000 units  p.o. weekly, follow with PCP to repeat vitamin D level after 3 to 6 months.  # OSA -non complaint with cpap, continue noctural O2 as needed   # Hx DVT./PE CTA neg for PE -continue on coumadin per pharmacy protocol    # Anxiety and Depression -resume home regimen   # Nicotine dependence, patient smokes cigarettes daily.   Smoking cessation counseling done.    # Morbid Obesity Body mass index is 61.5 kg/m.  Interventions:       Diet: Heart healthy diet DVT Prophylaxis: home coumadin  Advance goals of care discussion: Full code  Family Communication: none @ bedside  Disposition:  Home tomorrow pending pt eval today  Subjective: reports ongoing wheeze and generalized weakness slowly improving  Physical Exam: General: NAD, lying comfortably Appear in no distress, affect appropriate Eyes: PERRLA ENT: Oral Mucosa Clear, moist  Neck: no JVD,  Cardiovascular: S1 and S2 Present, no Murmur,  Respiratory: exp wheeze b/l Abdomen: Bowel Sound present, Soft and no tenderness, obese Skin: no rashes Extremities: trace Pedal edema, no calf tenderness Neurologic: moving all 4  Vitals:   11/11/22 0251 11/11/22 0520 11/11/22 0732 11/11/22 0915  BP:  (!) 140/85 (!) 140/87 137/75  Pulse:  62 71 72  Resp:  18 12   Temp:  97.8 F (36.6 C) 98 F (36.7 C)   TempSrc:  Oral    SpO2:  96% 97%   Weight: (!) 165.3 kg     Height:        Intake/Output Summary (Last 24 hours) at 11/11/2022 1040 Last data filed  at 11/11/2022 0524 Gross per 24 hour  Intake 120 ml  Output --  Net 120 ml   Filed Weights   11/09/22 0337 11/10/22 0700 11/11/22 0251  Weight: (!) 161.6 kg (!) 161.4 kg (!) 165.3 kg    Data Reviewed: I have personally reviewed and interpreted daily labs, tele strips, imagings as discussed above. I reviewed all nursing notes, pharmacy notes, vitals, pertinent old records I have discussed plan of care as described above with RN and patient/family.  CBC: Recent Labs   Lab 11/07/22 1434 11/08/22 0502 11/09/22 0433  WBC 6.4 6.9 11.2*  HGB 15.1* 14.9 15.0  HCT 44.8 43.7 45.3  MCV 108.5* 108.2* 110.5*  PLT 188 208 229   Basic Metabolic Panel: Recent Labs  Lab 11/07/22 1434 11/08/22 0502 11/08/22 0856 11/09/22 0433  NA 134* 133*  --  138  K 4.2 3.7  --  4.2  CL 99 100  --  104  CO2 24 21*  --  25  GLUCOSE 104* 220*  --  97  BUN 6 15  --  18  CREATININE 0.86 0.92  --  0.93  CALCIUM 8.6* 8.7*  --  8.8*  MG  --   --  2.1  --   PHOS  --   --  3.2  --     Studies: No results found.  Scheduled Meds:  fluticasone furoate-vilanterol  1 puff Inhalation Daily   guaiFENesin  600 mg Oral BID   lamoTRIgine  200 mg Oral Daily   predniSONE  40 mg Oral Q breakfast   spironolactone  100 mg Oral Daily   venlafaxine XR  150 mg Oral Q breakfast   Vitamin D (Ergocalciferol)  50,000 Units Oral Q7 days   warfarin  6 mg Oral ONCE-1600   Warfarin - Pharmacist Dosing Inpatient   Does not apply q1600   Continuous Infusions: PRN Meds: acetaminophen **OR** acetaminophen, [EXPIRED] benzonatate **FOLLOWED BY** benzonatate, chlorpheniramine-HYDROcodone, clonazePAM, ipratropium-albuterol, levalbuterol, ondansetron **OR** ondansetron (ZOFRAN) IV, phenol  Time spent: 35 minutes  Author: Shonna Chock, MD [facility] 11/11/2022 10:40 AM  To reach On-call, see care teams to locate the attending and reach out to them via [email or web address]. If 7PM-7AM, please contact night-coverage If you still have difficulty reaching the attending provider, please page the [facility] (Director on Call) for [facility] on amion for assistance.

## 2022-11-11 NOTE — Progress Notes (Signed)
ANTICOAGULATION CONSULT NOTE  Pharmacy Consult for warfarin Indication: history of DVT  Allergies  Allergen Reactions   Morphine And Codeine Itching    Patient Measurements: Height: 5' 3.5" (161.3 cm) Weight: (!) 165.3 kg (364 lb 6.7 oz) IBW/kg (Calculated) : 53.55  Vital Signs: Temp: 98 F (36.7 C) (05/31 0732) Temp Source: Oral (05/31 0520) BP: 140/87 (05/31 0732) Pulse Rate: 71 (05/31 0732)  Labs: Recent Labs    11/09/22 0433 11/10/22 0423 11/11/22 0503  HGB 15.0  --   --   HCT 45.3  --   --   PLT 229  --   --   LABPROT 15.7* 16.8* 19.0*  INR 1.2 1.3* 1.6*  CREATININE 0.93  --   --      Estimated Creatinine Clearance: 112.3 mL/min (by C-G formula based on SCr of 0.93 mg/dL).   Medical History: Past Medical History:  Diagnosis Date   Anxiety    Blood clotting disorder (HCC)    Complication of anesthesia    Depression    Diverticulitis    Dysmenorrhea    Endometriosis    Hematoma of right lower extremity    Menorrhagia    Morbid obesity (HCC)    Pancreatitis, acute    PCOS (polycystic ovarian syndrome)    Pneumonia    PONV (postoperative nausea and vomiting)    General Anesthesia   Rib fracture    Left     Medications:  Warfarin 3 mg every Mon, Tues, Thurs, Friday, Saturday Warfarin 6 mg every Sun, Wed  Last dose of warfarin 5/26   Assessment: 52 year old female admitted with acute exacerbation of COPD versus acute bronchitis. 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  5/27 INR 1.2 Warfarin 4 mg  5/28 INR 1.2 Warfarin 5 mg  5/29 INR 1.2 Warfarin 7.5 mg  5/30 INR 1.3 Warfarin 7.5 mg 5/31 INR 1.6 Warfarin 6 mg  DDI: Azithromycin (completed), (also on steroids)  Goal of Therapy:  INR 2-3 Monitor platelets by anticoagulation protocol: Yes   Plan:  INR is subtherapeutic with a trend up. Will give warfarin 6 mg x 1 tonight. Daily INR. CBC at least every 3 days.   Discharge recommendation: Continue pt's home regimen:  Warfarin  3 mg every Mon, Tues, Thurs, Friday, Saturday Warfarin 6 mg every Sun, Wed  Ronnald Ramp, PharmD, BCPS 11/11/2022,7:45 AM

## 2022-11-12 DIAGNOSIS — J441 Chronic obstructive pulmonary disease with (acute) exacerbation: Secondary | ICD-10-CM | POA: Diagnosis not present

## 2022-11-12 LAB — PROTIME-INR
INR: 1.6 — ABNORMAL HIGH (ref 0.8–1.2)
Prothrombin Time: 19.2 seconds — ABNORMAL HIGH (ref 11.4–15.2)

## 2022-11-12 LAB — GLUCOSE, CAPILLARY
Glucose-Capillary: 79 mg/dL (ref 70–99)
Glucose-Capillary: 104 mg/dL — ABNORMAL HIGH (ref 70–99)
Glucose-Capillary: 180 mg/dL — ABNORMAL HIGH (ref 70–99)

## 2022-11-12 MED ORDER — PREDNISONE 50 MG PO TABS: 50.0000 mg | ORAL_TABLET | Freq: Every day | ORAL | 0 refills | Status: AC

## 2022-11-12 MED ORDER — VITAMIN D (ERGOCALCIFEROL) 1.25 MG (50000 UNIT) PO CAPS: 50000.0000 [IU] | ORAL_CAPSULE | ORAL | 1 refills | Status: AC

## 2022-11-12 MED ORDER — IPRATROPIUM-ALBUTEROL 0.5-2.5 (3) MG/3ML IN SOLN: 3.0000 mL | RESPIRATORY_TRACT | 1 refills | Status: AC | PRN

## 2022-11-12 MED ORDER — WARFARIN SODIUM 7.5 MG PO TABS
7.5000 mg | ORAL_TABLET | Freq: Once | ORAL | Status: AC
  Administered 2022-11-12: 7.5 mg via ORAL
  Filled 2022-11-12: qty 1

## 2022-11-12 MED ORDER — GUAIFENESIN ER 600 MG PO TB12: 600.0000 mg | ORAL_TABLET | Freq: Two times a day (BID) | ORAL | 0 refills | Status: AC

## 2022-11-12 NOTE — Progress Notes (Signed)
ANTICOAGULATION CONSULT NOTE  Pharmacy Consult for warfarin Indication: history of DVT  Allergies  Allergen Reactions   Morphine And Codeine Itching    Patient Measurements: Height: 5' 3.5" (161.3 cm) Weight: (!) 162.9 kg (359 lb 2.1 oz) IBW/kg (Calculated) : 53.55  Vital Signs: Temp: 97.7 F (36.5 C) (06/01 0745) Temp Source: Oral (06/01 0745) BP: 126/83 (06/01 0745) Pulse Rate: 66 (06/01 0858)  Labs: Recent Labs    11/10/22 0423 11/11/22 0503 11/12/22 0538  LABPROT 16.8* 19.0* 19.2*  INR 1.3* 1.6* 1.6*     Estimated Creatinine Clearance: 111.2 mL/min (by C-G formula based on SCr of 0.93 mg/dL).   Medical History: Past Medical History:  Diagnosis Date   Anxiety    Blood clotting disorder (HCC)    Complication of anesthesia    Depression    Diverticulitis    Dysmenorrhea    Endometriosis    Hematoma of right lower extremity    Menorrhagia    Morbid obesity (HCC)    Pancreatitis, acute    PCOS (polycystic ovarian syndrome)    Pneumonia    PONV (postoperative nausea and vomiting)    General Anesthesia   Rib fracture    Left     Medications:  Warfarin 3 mg every Mon, Tues, Thurs, Friday, Saturday Warfarin 6 mg every Sun, Wed  Last dose of warfarin 5/26   Assessment: 52 year old female admitted with acute exacerbation of COPD versus acute bronchitis. 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  5/27 INR 1.2 Warfarin 4 mg  5/28 INR 1.2 Warfarin 5 mg  5/29 INR 1.2 Warfarin 7.5 mg  5/30 INR 1.3 Warfarin 7.5 mg 5/31 INR 1.6 Warfarin 6 mg 6/1   INR 1.6  DDI: Azithromycin (completed 5/31),  Goal of Therapy:  INR 2-3 Monitor platelets by anticoagulation protocol: Yes   Plan:  INR is subtherapeutic. Will order warfarin 7.5 mg x 1 tonight. Daily INR. CBC at least every 3 days.   Discharge recommendation: Continue pt's home regimen:  Warfarin 3 mg every Mon, Tues, Thurs, Friday, Saturday Warfarin 6 mg every Sun, Wed F/u INR outpatient as  may need dose increase  Ramata Strothman A, PharmD 11/12/2022,11:34 AM

## 2022-11-12 NOTE — Evaluation (Signed)
Physical Therapy Evaluation Patient Details Name: Linda Ortiz MRN: [id number] DOB: 01-Jan-1971 Today's Date: 11/12/2022  History of Present Illness  presented to ER secondary to progressive SOB, sore throat and cough; admitted for management of AECOPD, severe bronchitis secondary to rhinovirus.  Clinical Impression  Patient resting in bed upon arrival to room; alert and oriented, follows commands and agreeable to session.  Endorses some indep mobility in room prior to evaluation.  Denies pain; does report improvement in respiratory status since admission.  Bilat UE/LE strength and ROM grossly symmetrical and WFL; no focal weakness appreciated.  Able to complete bed mobility with indep; sit/stand, basic transfers and gait (200') without assist device, mod indep.  Demonstrates reciprocal stepping pattern with good step height/length, slightly wide BOS (due to body habitus) but no overt buckling or LOB. Mild SOB with exertion; sats >98% on RA throughout. good awareness of need for activity pacing and energy conservation to optimize respiratory support  Appears to be at/near functional baseline; no acute PT needs identified at this time.  Patient in agreement.  Will complete PT orders at this time; please re-consult should needs change.       Recommendations for follow up therapy are one component of a multi-disciplinary discharge planning process, led by the attending physician.  Recommendations may be updated based on patient status, additional functional criteria and insurance authorization.  Follow Up Recommendations       Assistance Recommended at Discharge    Patient can return home with the following       Equipment Recommendations    Recommendations for Other Services       Functional Status Assessment Patient has not had a recent decline in their functional status     Precautions / Restrictions Precautions Precautions: None Restrictions Weight Bearing Restrictions: No       Mobility  Bed Mobility Overal bed mobility: Independent                  Transfers Overall transfer level: Modified independent Equipment used: None                    Ambulation/Gait Ambulation/Gait assistance: Modified independent (Device/Increase time) Gait Distance (Feet): 200 Feet Assistive device: None   Gait velocity: 10' walk time, 6-7 seconds     General Gait Details: reciprocal stepping pattern with good step height/length, slightly wide BOS (due to body habitus) but no overt buckling or LOB. Mild SOB with exertion; sats >98% on RA throughout.  good awareness of need for activity pacing and energy conservation to optimize respiratory support  Stairs            Wheelchair Mobility    Modified Rankin (Stroke Patients Only)       Balance Overall balance assessment: Modified Independent                                           Pertinent Vitals/Pain Pain Assessment Pain Assessment: No/denies pain    Home Living Family/patient expects to be discharged to:: Private residence Living Arrangements: Alone   Type of Home: House Home Access: Level entry       Home Layout: One level Home Equipment: None      Prior Function Prior Level of Function : Independent/Modified Independent             Mobility Comments: Indep with ADls, household and  community mobilization; 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)       Higher education careers adviser        Extremity/Trunk Assessment   Upper Extremity Assessment Upper Extremity Assessment: Overall WFL for tasks assessed (grossly 4+ to 5/5; no focal weakness)    Lower Extremity Assessment Lower Extremity Assessment: Overall WFL for tasks assessed (grossly 4+ to 5/5; no focal weakness)       Communication   Communication: No difficulties  Cognition Arousal/Alertness: Awake/alert Behavior During Therapy: WFL for tasks assessed/performed Overall Cognitive  Status: Within Functional Limits for tasks assessed                                          General Comments      Exercises     Assessment/Plan    PT Assessment Patient does not need any further PT services  PT Problem List Decreased mobility;Cardiopulmonary status limiting activity       PT Treatment Interventions Gait training;Functional mobility training;Therapeutic activities;Patient/family education    PT Goals (Current goals can be found in the Care Plan section)  Acute Rehab PT Goals Patient Stated Goal: to go home today PT Goal Formulation: All assessment and education complete, DC therapy Time For Goal Achievement: 11/12/22 Potential to Achieve Goals: Good    Frequency       Co-evaluation               AM-PAC PT "6 Clicks" Mobility  Outcome Measure Help needed turning from your back to your side while in a flat bed without using bedrails?: None Help needed moving from lying on your back to sitting on the side of a flat bed without using bedrails?: None Help needed moving to and from a bed to a chair (including a wheelchair)?: None Help needed standing up from a chair using your arms (e.g., wheelchair or bedside chair)?: None Help needed to walk in hospital room?: None Help needed climbing 3-5 steps with a railing? : None 6 Click Score: 24    End of Session   Activity Tolerance: Patient tolerated treatment well Patient left: in bed;with call bell/phone within reach Nurse Communication: Mobility status PT Visit Diagnosis: Difficulty in walking, not elsewhere classified (R26.2)    Time: 1610-9604 PT Time Calculation (min) (ACUTE ONLY): 15 min   Charges:   PT Evaluation $PT Eval Low Complexity: 1 Low         Makale Pindell H. Manson Passey, PT, DPT, NCS 11/12/22, 9:04 AM [phone]

## 2022-11-12 NOTE — Discharge Summary (Signed)
Physician Discharge Summary  Linda Ortiz HQI:[id number] DOB: 1971-06-02 DOA: 11/07/2022  PCP: Lynnea Ferrier, MD  Admit date: 11/07/2022  Discharge date: 11/13/2022  Admitted From: Home  Disposition:  Home  Recommendations for Outpatient Follow-up:  Follow up with PCP in 1-2 weeks. Please obtain BMP/CBC in one week Advised to take prednisone 50 mg for 2 more days. Advised to continue DuoNeb nebulization every 6 hours as needed for wheezing  Home Health: None Equipment/Devices: None  Discharge Condition: Stable CODE STATUS:Full code Diet recommendation: Heart Healthy   Brief [facility] Course: This 52 y.o. female with medical history significant of Anxiety, morbid obesity, PCOS, depression , PE/DVT on coumadin, tobacco abuse, OSA non compliant with cpap, gallstone pancreatitis s/p cholecystecomy, who presents to ED with 10 days hx. of progressive sob with productive green sputum, sore throat, + fever/chills/ and pleuritic chest pain as well as persistent wheezing. Patient also endorses n/v/d in setting of starting antibiotics.  Patient was prescribed cefuroxime 3 days ago  by pcp for possible  for sinus infection. Patient current states she is still sob and still has severe cough. ED workup: Due to persistent shortness of breath, cough and dyspnea on exertion, O2 sats dropped on ambulation so patient was admitted under [facility] service. Patient was admitted for acute on chronic COPD exacerbation likely secondary to rhinovirus.  Patient was continued on IV Solu-Medrol scheduled and as needed nebulized bronchodilators, CTA chest negative for PE.  Patient has made significant improvement. Norm PT evaluation. Patient was discharged on 6/1 and then told the provider that day she wanted to stay another night so her discharge was canceled.    Discharge Diagnoses:  Principal Problem:   COPD exacerbation (HCC)  #AECOPD vs severe Acute bronchitis secondary to rhinovirus Patient is not  formally diagnosed with COPD, but she is a active cigarette smoker RVP positive for rhinovirus CTA chest negative for PE Continue symptomatic treatment S/p Solu-Medrol IV, continue prednisone  Continue DuoNeb prn Started Breo Ellipta inhaler Has not required o2   # Folic acid deficiency, folate level 5.8, started folic acid 1 mg p.o. daily Follow with PCP to repeat folate level after 3 to 6 months.   # Vitamin D deficiency: started vitamin D    # OSA -non complaint with cpap, continue noctural O2 as needed   # Hx DVT./PE CTA neg for PE -continue on coumadin per pharmacy protocol      # Morbid Obesity Body mass index is 61.5 kg/m.  Interventions:    Discharge Instructions  Discharge Instructions     Call MD for:  difficulty breathing, headache or visual disturbances   Complete by: As directed    Call MD for:  persistant dizziness or light-headedness   Complete by: As directed    Call MD for:  persistant nausea and vomiting   Complete by: As directed    Diet - low sodium heart healthy   Complete by: As directed    Diet Carb Modified   Complete by: As directed    Discharge instructions   Complete by: As directed    Advised to follow-up with primary care physician in 1 week. Advised to take prednisone 50 mg for 2 more days. Advised to continue DuoNeb nebulization every 6 hours as needed for wheezing   Discharge patient   Complete by: As directed    Discharge disposition: 01-Home or Self Care   Discharge patient date: 11/13/2022   Increase activity slowly   Complete by: As  directed       Allergies as of 11/13/2022       Reactions   Morphine And Codeine Itching        Medication List     STOP taking these medications    cefUROXime 500 MG tablet Commonly known as: CEFTIN       TAKE these medications    benzonatate 200 MG capsule Commonly known as: TESSALON Take 200 mg by mouth 3 (three) times daily as needed for cough.   cetirizine 10 MG  tablet Commonly known as: ZYRTEC Take 10 mg by mouth daily.   Cyanocobalamin 1000 MCG/ML Kit Inject 1,000 mcg as directed every 30 (thirty) days.   guaiFENesin 600 MG 12 hr tablet Commonly known as: MUCINEX Take 1 tablet (600 mg total) by mouth 2 (two) times daily for 5 days.   ipratropium-albuterol 0.5-2.5 (3) MG/3ML Soln Commonly known as: DUONEB Take 3 mLs by nebulization every 4 (four) hours as needed.   lamoTRIgine 200 MG tablet Commonly known as: LAMICTAL Take 1 tablet (200 mg total) by mouth daily.   predniSONE 50 MG tablet Commonly known as: DELTASONE Take 1 tablet (50 mg total) by mouth daily with breakfast for 2 days. What changed:  medication strength how much to take how to take this when to take this additional instructions   spironolactone 50 MG tablet Commonly known as: ALDACTONE Take 100 mg by mouth daily.   venlafaxine XR 150 MG 24 hr capsule Commonly known as: EFFEXOR-XR TAKE 1 CAPSULE BY MOUTH DAILY   Vitamin D (Ergocalciferol) 1.25 MG (50000 UNIT) Caps capsule Commonly known as: DRISDOL Take 1 capsule (50,000 Units total) by mouth every 7 (seven) days. Start taking on: November 16, 2022   Vitamin D3 25 MCG (1000 UT) Caps Take 2,000 Units by mouth daily.   warfarin 6 MG tablet Commonly known as: COUMADIN Take 6 mg by mouth See admin instructions. Take 1 tablet (6mg ) by mouth every Sunday and Wednesday morning   warfarin 6 MG tablet Commonly known as: COUMADIN Take 3 mg by mouth See admin instructions. Take  tablet (3mg ) by mouth every Monday, Tuesday, Thursday, Friday and Saturday morning         Follow-up Information     Curtis Sites III, MD Follow up in 1 week(s).   Specialty: Internal Medicine Contact information: [address] [facility] [place] [address] [phone]                Allergies  Allergen Reactions   Morphine And Codeine Itching    Consultations: None   Procedures/Studies: CT  Angio Chest Pulmonary Embolism (PE) W or WO Contrast  Result Date: 11/07/2022 CLINICAL DATA:  Worsening shortness of breath. EXAM: CT ANGIOGRAPHY CHEST WITH CONTRAST TECHNIQUE: Multidetector CT imaging of the chest was performed using the standard protocol during bolus administration of intravenous contrast. Multiplanar CT image reconstructions and MIPs were obtained to evaluate the vascular anatomy. RADIATION DOSE REDUCTION: This exam was performed according to the departmental dose-optimization program which includes automated exposure control, adjustment of the mA and/or kV according to patient size and/or use of iterative reconstruction technique. CONTRAST:  75mL OMNIPAQUE IOHEXOL 350 MG/ML SOLN COMPARISON:  May 05, 2021 FINDINGS: Cardiovascular: Satisfactory opacification of the pulmonary arteries to the segmental level. No evidence of pulmonary embolism. Normal heart size. No pericardial effusion. Mediastinum/Nodes: No enlarged mediastinal, hilar, or axillary lymph nodes. Thyroid gland, trachea, and esophagus demonstrate no significant findings. Lungs/Pleura: Lungs are clear. No  pleural effusion or pneumothorax. Upper Abdomen: No acute abnormality. Musculoskeletal: No chest wall abnormality. No acute or significant osseous findings. Review of the MIP images confirms the above findings. IMPRESSION: 1. No evidence of pulmonary embolism. 2. No active cardiopulmonary disease. Electronically Signed   By: Ted Mcalpine M.D.   On: 11/07/2022 19:23   DG Chest 2 View  Result Date: 11/07/2022 CLINICAL DATA:  Cough, fever EXAM: CHEST - 2 VIEW COMPARISON:  CT chest dated May 05, 2021 FINDINGS: The heart size and mediastinal contours are within normal limits. Both lungs are clear. The visualized skeletal structures are unremarkable. IMPRESSION: No active cardiopulmonary disease. Electronically Signed   By: Larose Hires D.O.   On: 11/07/2022 15:12      Subjective: Patient was seen and examined at  bedside.  Overnight events noted.   Patient reports doing much better.  Breathing is improved.  She wants to be discharged.  Discharge Exam: Vitals:   11/13/22 0441 11/13/22 0734  BP: (!) 141/89 137/86  Pulse: 60 65  Resp: 16 17  Temp: 97.7 F (36.5 C) 98.2 F (36.8 C)  SpO2: 98% 99%   Vitals:   11/12/22 1655 11/12/22 2006 11/13/22 0441 11/13/22 0734  BP: 139/83 (!) 148/88 (!) 141/89 137/86  Pulse: 69 88 60 65  Resp: 18 16 16 17   Temp: 98.3 F (36.8 C) 98.4 F (36.9 C) 97.7 F (36.5 C) 98.2 F (36.8 C)  TempSrc:  Oral Oral   SpO2: 99% 100% 98% 99%  Weight:      Height:        General: Pt is alert, awake, not in acute distress Cardiovascular: RRR, S1/S2 +, no rubs, no gallops Respiratory: CTA bilaterally, no wheezing, no rhonchi Abdominal: Soft, NT, ND, bowel sounds + Extremities: no edema, no cyanosis    The results of significant diagnostics from this hospitalization (including imaging, microbiology, ancillary and laboratory) are listed below for reference.     Microbiology: Recent Results (from the past 240 hour(s))  Respiratory (~20 pathogens) panel by PCR     Status: Abnormal   Collection Time: 11/07/22  6:42 PM   Specimen: Nasopharyngeal Swab; Respiratory  Result Value Ref Range Status   Adenovirus NOT DETECTED NOT DETECTED Final   Coronavirus 229E NOT DETECTED NOT DETECTED Final    Comment: (NOTE) The Coronavirus on the Respiratory Panel, DOES NOT test for the novel  Coronavirus (2019 nCoV)    Coronavirus HKU1 NOT DETECTED NOT DETECTED Final   Coronavirus NL63 NOT DETECTED NOT DETECTED Final   Coronavirus OC43 NOT DETECTED NOT DETECTED Final   Metapneumovirus NOT DETECTED NOT DETECTED Final   Rhinovirus / Enterovirus DETECTED (A) NOT DETECTED Final   Influenza A NOT DETECTED NOT DETECTED Final   Influenza B NOT DETECTED NOT DETECTED Final   Parainfluenza Virus 1 NOT DETECTED NOT DETECTED Final   Parainfluenza Virus 2 NOT DETECTED NOT DETECTED Final    Parainfluenza Virus 3 NOT DETECTED NOT DETECTED Final   Parainfluenza Virus 4 NOT DETECTED NOT DETECTED Final   Respiratory Syncytial Virus NOT DETECTED NOT DETECTED Final   Bordetella pertussis NOT DETECTED NOT DETECTED Final   Bordetella Parapertussis NOT DETECTED NOT DETECTED Final   Chlamydophila pneumoniae NOT DETECTED NOT DETECTED Final   Mycoplasma pneumoniae NOT DETECTED NOT DETECTED Final    Comment: Performed at [facility], [address] [place], [place] [address]     Labs: BNP (last 3 results) Recent Labs    11/07/22 1434  BNP  59.1   Basic Metabolic Panel: Recent Labs  Lab 11/07/22 1434 11/08/22 0502 11/08/22 0856 11/09/22 0433  NA 134* 133*  --  138  K 4.2 3.7  --  4.2  CL 99 100  --  104  CO2 24 21*  --  25  GLUCOSE 104* 220*  --  97  BUN 6 15  --  18  CREATININE 0.86 0.92  --  0.93  CALCIUM 8.6* 8.7*  --  8.8*  MG  --   --  2.1  --   PHOS  --   --  3.2  --    Liver Function Tests: Recent Labs  Lab 11/07/22 1434 11/08/22 0502  AST 39 33  ALT 28 25  ALKPHOS 77 69  BILITOT 1.6* 0.6  PROT 6.0* 6.2*  ALBUMIN 3.3* 3.3*   No results for input(s): "LIPASE", "AMYLASE" in the last 168 hours. No results for input(s): "AMMONIA" in the last 168 hours. CBC: Recent Labs  Lab 11/07/22 1434 11/08/22 0502 11/09/22 0433 11/13/22 0529  WBC 6.4 6.9 11.2* 9.0  HGB 15.1* 14.9 15.0 14.4  HCT 44.8 43.7 45.3 43.4  MCV 108.5* 108.2* 110.5* 108.8*  PLT 188 208 229 245   Cardiac Enzymes: No results for input(s): "CKTOTAL", "CKMB", "CKMBINDEX", "TROPONINI" in the last 168 hours. BNP: Invalid input(s): "POCBNP" CBG: Recent Labs  Lab 11/11/22 2132 11/12/22 0812 11/12/22 1222 11/12/22 2007 11/13/22 0732  GLUCAP 89 79 104* 180* 90   D-Dimer No results for input(s): "DDIMER" in the last 72 hours. Hgb A1c No results for input(s): "HGBA1C" in the last 72 hours. Lipid Profile No results for input(s): "CHOL", "HDL", "LDLCALC", "TRIG", "CHOLHDL",  "LDLDIRECT" in the last 72 hours. Thyroid function studies No results for input(s): "TSH", "T4TOTAL", "T3FREE", "THYROIDAB" in the last 72 hours.  Invalid input(s): "FREET3" Anemia work up No results for input(s): "VITAMINB12", "FOLATE", "FERRITIN", "TIBC", "IRON", "RETICCTPCT" in the last 72 hours. Urinalysis    Component Value Date/Time   COLORURINE COLORLESS (A) 10/08/2020 1641   APPEARANCEUR CLEAR (A) 10/08/2020 1641   LABSPEC 1.001 (L) 10/08/2020 1641   PHURINE 6.0 10/08/2020 1641   GLUCOSEU NEGATIVE 10/08/2020 1641   HGBUR NEGATIVE 10/08/2020 1641   BILIRUBINUR NEGATIVE 10/08/2020 1641   BILIRUBINUR neg 08/20/2019 1024   KETONESUR NEGATIVE 10/08/2020 1641   PROTEINUR NEGATIVE 10/08/2020 1641   UROBILINOGEN 0.2 08/20/2019 1024   NITRITE NEGATIVE 10/08/2020 1641   LEUKOCYTESUR SMALL (A) 10/08/2020 1641   Sepsis Labs Recent Labs  Lab 11/07/22 1434 11/08/22 0502 11/09/22 0433 11/13/22 0529  WBC 6.4 6.9 11.2* 9.0   Microbiology Recent Results (from the past 240 hour(s))  Respiratory (~20 pathogens) panel by PCR     Status: Abnormal   Collection Time: 11/07/22  6:42 PM   Specimen: Nasopharyngeal Swab; Respiratory  Result Value Ref Range Status   Adenovirus NOT DETECTED NOT DETECTED Final   Coronavirus 229E NOT DETECTED NOT DETECTED Final    Comment: (NOTE) The Coronavirus on the Respiratory Panel, DOES NOT test for the novel  Coronavirus (2019 nCoV)    Coronavirus HKU1 NOT DETECTED NOT DETECTED Final   Coronavirus NL63 NOT DETECTED NOT DETECTED Final   Coronavirus OC43 NOT DETECTED NOT DETECTED Final   Metapneumovirus NOT DETECTED NOT DETECTED Final   Rhinovirus / Enterovirus DETECTED (A) NOT DETECTED Final   Influenza A NOT DETECTED NOT DETECTED Final   Influenza B NOT DETECTED NOT DETECTED Final   Parainfluenza Virus 1 NOT DETECTED NOT DETECTED Final  Parainfluenza Virus 2 NOT DETECTED NOT DETECTED Final   Parainfluenza Virus 3 NOT DETECTED NOT DETECTED Final    Parainfluenza Virus 4 NOT DETECTED NOT DETECTED Final   Respiratory Syncytial Virus NOT DETECTED NOT DETECTED Final   Bordetella pertussis NOT DETECTED NOT DETECTED Final   Bordetella Parapertussis NOT DETECTED NOT DETECTED Final   Chlamydophila pneumoniae NOT DETECTED NOT DETECTED Final   Mycoplasma pneumoniae NOT DETECTED NOT DETECTED Final    Comment: Performed at [facility], [address], [place], [place] [address]     Time coordinating discharge: Over 30 minutes  SIGNED:   Silvano Bilis, MD  [facility] 11/13/2022, 10:11 AM Pager   If 7PM-7AM, please contact night-coverage

## 2022-11-12 NOTE — TOC Transition Note (Signed)
Transition of Care [facility]) - CM/SW Discharge Note   Patient Details  Name: Linda Ortiz MRN: [id number] Date of Birth: 1970/10/23  Transition of Care [facility]) CM/SW Contact:  Allena Katz, LCSW Phone Number: 11/12/2022, 10:21 AM   Clinical Narrative:   Pt has orders to discharge home. Pt declines HH services and has no additional needs TOC signing off.     Final next level of care: Home/Self Care Barriers to Discharge: Barriers Resolved   Patient Goals and CMS Choice      Discharge Placement                         Discharge Plan and Services Additional resources added to the After Visit Summary for                                       Social Determinants of Health (SDOH) Interventions SDOH Screenings   Food Insecurity: No Food Insecurity (11/07/2022)  Housing: Low Risk  (11/07/2022)  Transportation Needs: No Transportation Needs (11/07/2022)  Utilities: Not At Risk (11/07/2022)  Tobacco Use: High Risk (11/07/2022)     Readmission Risk Interventions    11/09/2022    2:18 PM  Readmission Risk Prevention Plan  Post Dischage Appt Complete  Medication Screening Complete  Transportation Screening Complete

## 2022-11-13 DIAGNOSIS — J441 Chronic obstructive pulmonary disease with (acute) exacerbation: Secondary | ICD-10-CM | POA: Diagnosis not present

## 2022-11-13 LAB — CBC
HCT: 43.4 % (ref 36.0–46.0)
Hemoglobin: 14.4 g/dL (ref 12.0–15.0)
MCH: 36.1 pg — ABNORMAL HIGH (ref 26.0–34.0)
MCHC: 33.2 g/dL (ref 30.0–36.0)
MCV: 108.8 fL — ABNORMAL HIGH (ref 80.0–100.0)
Platelets: 245 10*3/uL (ref 150–400)
RBC: 3.99 MIL/uL (ref 3.87–5.11)
RDW: 12.3 % (ref 11.5–15.5)
WBC: 9 10*3/uL (ref 4.0–10.5)
nRBC: 0 % (ref 0.0–0.2)

## 2022-11-13 LAB — PROTIME-INR
INR: 1.7 — ABNORMAL HIGH (ref 0.8–1.2)
Prothrombin Time: 20.6 seconds — ABNORMAL HIGH (ref 11.4–15.2)

## 2022-11-13 LAB — GLUCOSE, CAPILLARY: Glucose-Capillary: 90 mg/dL (ref 70–99)

## 2022-11-13 MED ORDER — FOLIC ACID 1 MG PO TABS: 1.0000 mg | ORAL_TABLET | Freq: Every day | ORAL | 10 refills | Status: AC

## 2022-11-13 MED ORDER — WARFARIN SODIUM 7.5 MG PO TABS
7.5000 mg | ORAL_TABLET | Freq: Once | ORAL | Status: DC
  Filled 2022-11-13: qty 1

## 2022-11-13 NOTE — Progress Notes (Signed)
ANTICOAGULATION CONSULT NOTE  Pharmacy Consult for warfarin Indication: history of DVT  Allergies  Allergen Reactions   Morphine And Codeine Itching    Patient Measurements: Height: 5' 3.5" (161.3 cm) Weight: (!) 162.9 kg (359 lb 2.1 oz) IBW/kg (Calculated) : 53.55  Vital Signs: Temp: 98.2 F (36.8 C) (06/02 0734) Temp Source: Oral (06/02 0441) BP: 137/86 (06/02 0734) Pulse Rate: 65 (06/02 0734)  Labs: Recent Labs    11/11/22 0503 11/12/22 0538 11/13/22 0529  HGB  --   --  14.4  HCT  --   --  43.4  PLT  --   --  245  LABPROT 19.0* 19.2* 20.6*  INR 1.6* 1.6* 1.7*     Estimated Creatinine Clearance: 111.2 mL/min (by C-G formula based on SCr of 0.93 mg/dL).   Medical History: Past Medical History:  Diagnosis Date   Anxiety    Blood clotting disorder (HCC)    Complication of anesthesia    Depression    Diverticulitis    Dysmenorrhea    Endometriosis    Hematoma of right lower extremity    Menorrhagia    Morbid obesity (HCC)    Pancreatitis, acute    PCOS (polycystic ovarian syndrome)    Pneumonia    PONV (postoperative nausea and vomiting)    General Anesthesia   Rib fracture    Left     Medications:  Warfarin 3 mg every Mon, Tues, Thurs, Friday, Saturday Warfarin 6 mg every Sun, Wed  Last dose of warfarin 5/26   Assessment: 52 year old female admitted with acute exacerbation of COPD versus acute bronchitis. 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  5/27 INR 1.2 Warfarin 4 mg  5/28 INR 1.2 Warfarin 5 mg  5/29 INR 1.2 Warfarin 7.5 mg  5/30 INR 1.3 Warfarin 7.5 mg 5/31 INR 1.6 Warfarin 6 mg 6/1   INR 1.6 Warfarin 7.5 mg 6/2   INR 1.7  DDI: Azithromycin (completed 5/31),  Goal of Therapy:  INR 2-3 Monitor platelets by anticoagulation protocol: Yes   Plan:  INR is subtherapeutic. Will order warfarin 7.5 mg x 1 tonight. Daily INR. CBC at least every 3 days.   Discharge recommendation: Continue pt's home regimen:  Warfarin 3 mg  every Mon, Tues, Thurs, Friday, Saturday Warfarin 6 mg every Sun, Wed F/u INR outpatient as may need dose increase  Selestino Nila A, PharmD 11/13/2022,12:03 PM

## 2022-11-13 NOTE — Progress Notes (Signed)
Pt verbalized that she called an [employer] ride for transportation home; pt discharged via wheelchair by nursing to the [facility] entrance

## 2022-11-13 NOTE — Progress Notes (Signed)
MD order received in [facility] to discharge pt home today; verbally reviewed AVS with pt; Rxs escribed to [facility] on [address] in [place], [place]; pt to call Dr B. Klein's office on Monday, 11/14/22 to set up follow up appointment in 7 days; no questions voiced at this time; pt to call her ride to come and pick her up at the [facility]; verbally advised the pt to let staff know when her ride is at the [facility] entrance in order for [place] to take her to the [facility] entrance for discharge

## 2022-11-13 NOTE — TOC Transition Note (Signed)
Transition of Care [facility]) - CM/SW Discharge Note   Patient Details  Name: Linda Ortiz MRN: [id number] Date of Birth: [birth year]  Transition of Care [facility]) CM/SW Contact:  Bing Quarry, RN Phone Number: 11/13/2022, 10:41 AM   Clinical Narrative:  DC order in to home/self care. No TOC consult or needs identified in DC orders. Has PCP to follow up with per provider discharge instructions: Advised to follow-up with primary care physician in 1 week. Advised to take prednisone 50 mg for 2 more days. Advised to continue DuoNeb nebulization every 6 hours as needed for wheezing    Gabriel Cirri RN CM    Final next level of care: Home/Self Care Barriers to Discharge: Barriers Resolved   Patient Goals and CMS Choice      Discharge Placement                         Discharge Plan and Services Additional resources added to the After Visit Summary for                                       Social Determinants of Health (SDOH) Interventions SDOH Screenings   Food Insecurity: No Food Insecurity (11/07/2022)  Housing: Low Risk  (11/07/2022)  Transportation Needs: No Transportation Needs (11/07/2022)  Utilities: Not At Risk (11/07/2022)  Tobacco Use: High Risk (11/07/2022)     Readmission Risk Interventions    11/09/2022    2:18 PM  Readmission Risk Prevention Plan  Post Dischage Appt Complete  Medication Screening Complete  Transportation Screening Complete

## 2022-11-23 IMAGING — MG MM DIGITAL DIAGNOSTIC UNILAT*R* W/ TOMO W/ CAD
6 series · 6 of 18 positions shown · non-contrast
Comparison: Previous exam(s).

ACR Breast Density Category a: The breast tissue is almost entirely
fatty.

CLINICAL DATA: 50-year-old female presenting as a recall from
screening for possible right breast asymmetry.

EXAM:
DIGITAL DIAGNOSTIC UNILATERAL RIGHT MAMMOGRAM WITH TOMOSYNTHESIS AND
CAD; ULTRASOUND RIGHT BREAST LIMITED
TECHNIQUE: Right digital diagnostic mammography and breast tomosynthesis was
performed. The images were evaluated with computer-aided detection.;
Targeted ultrasound examination of the right breast was performed

[R CC synth-2D (1 of 2)]
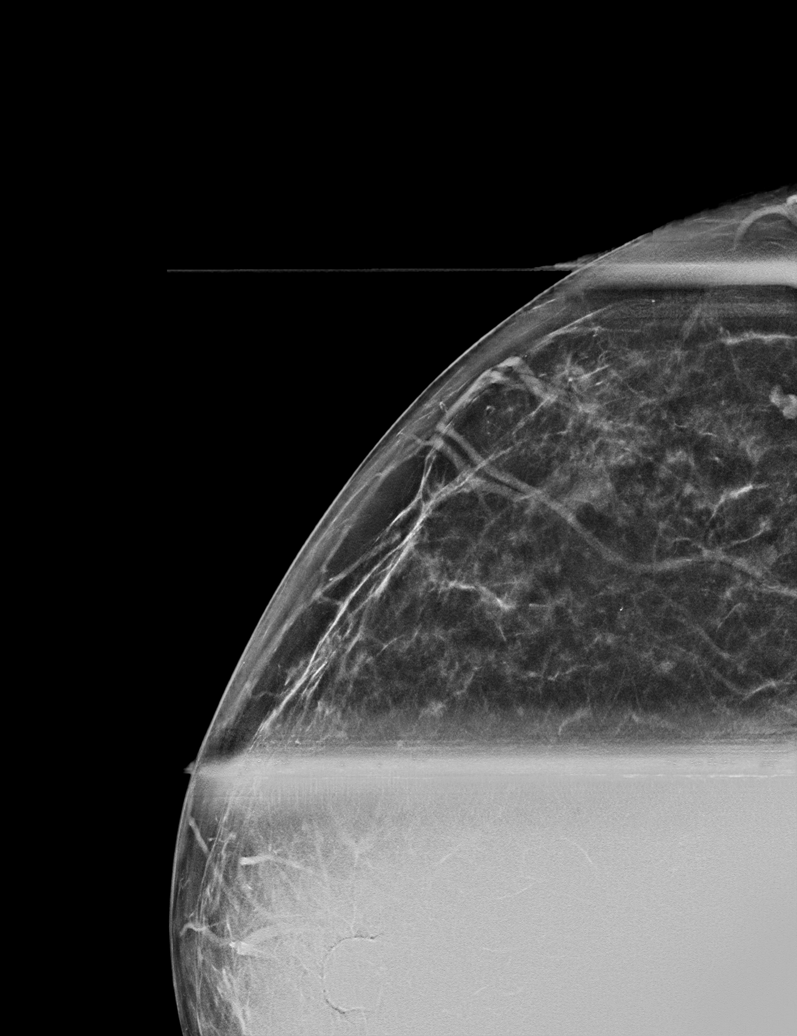

[R CC synth-2D (2 of 2)]
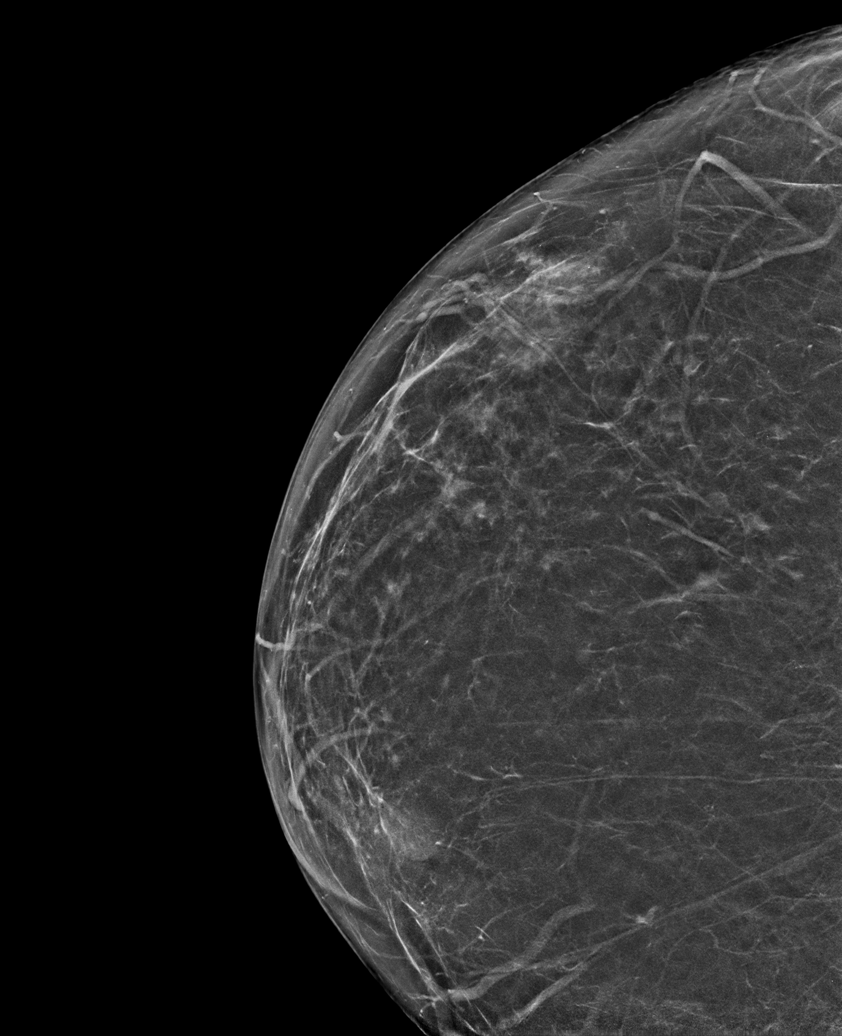

[R MLO synth-2D]
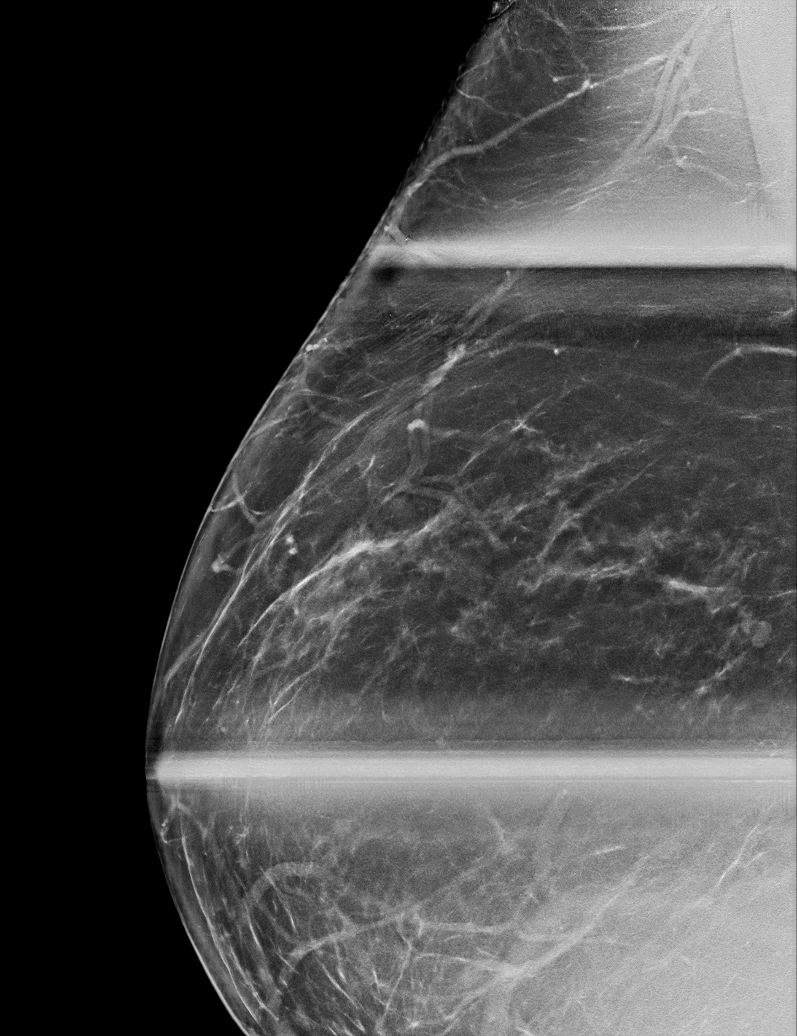

[R CC tomo (1 of 2) · tomo slice 39/77.0]
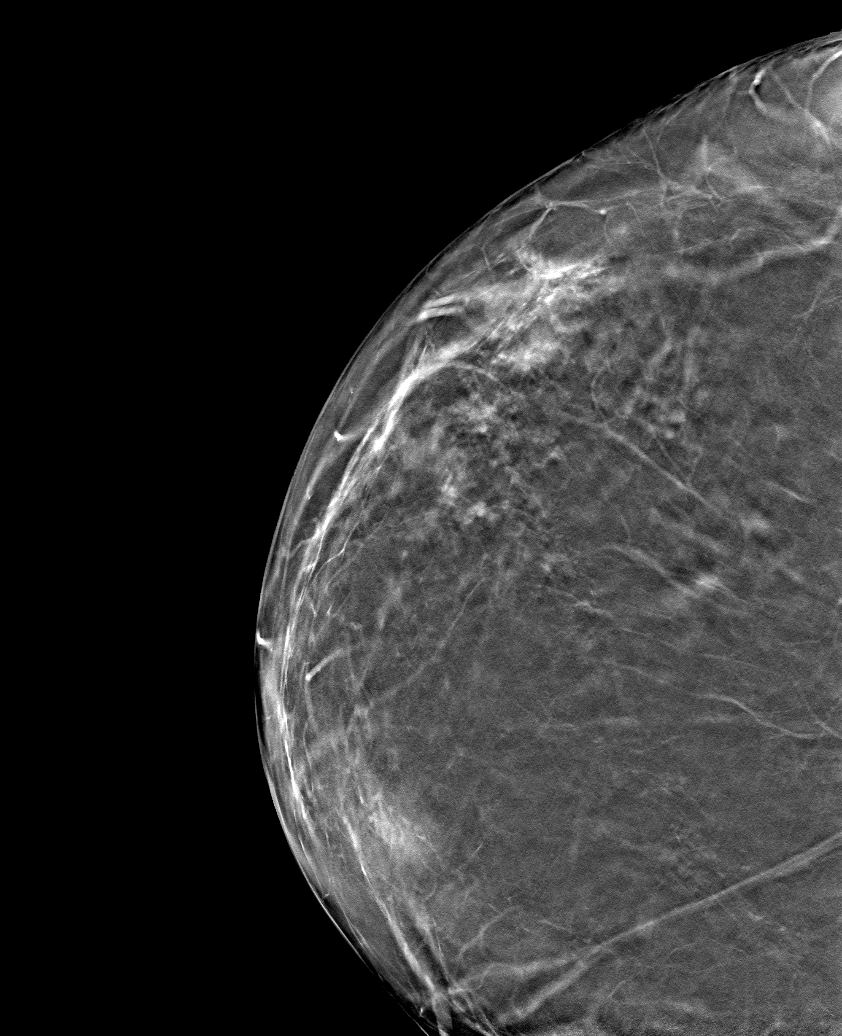

[R MLO tomo · tomo slice 45/89.0]
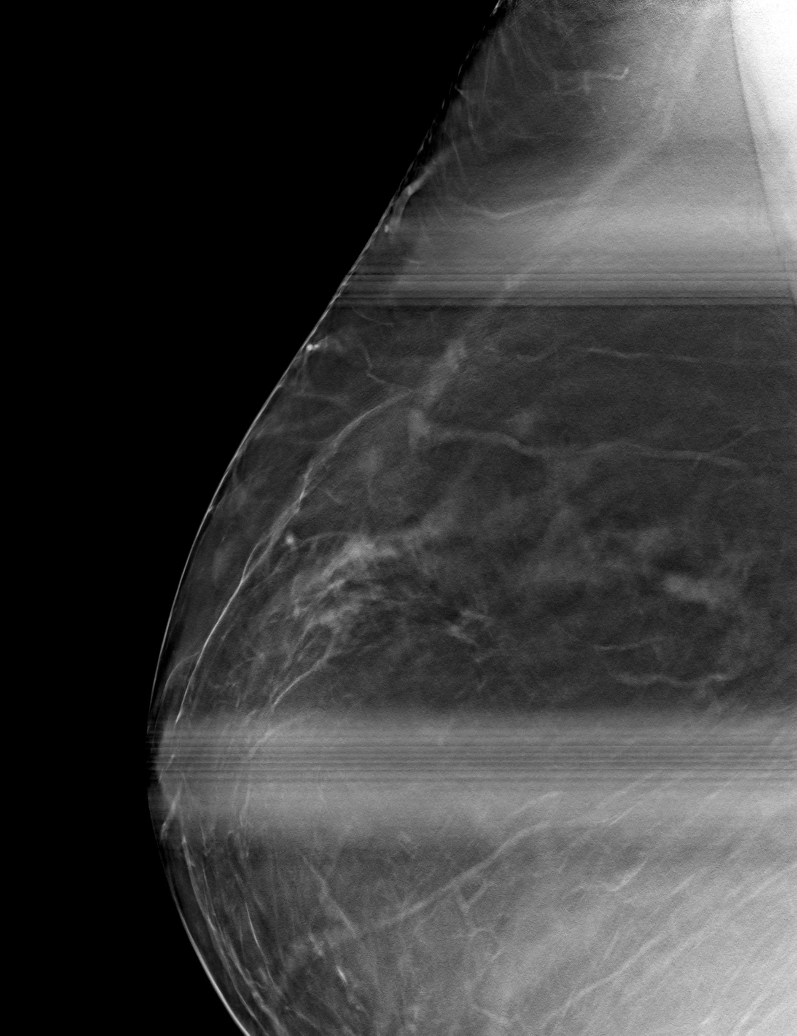

[R CC tomo (2 of 2) · tomo slice 33/64.0]
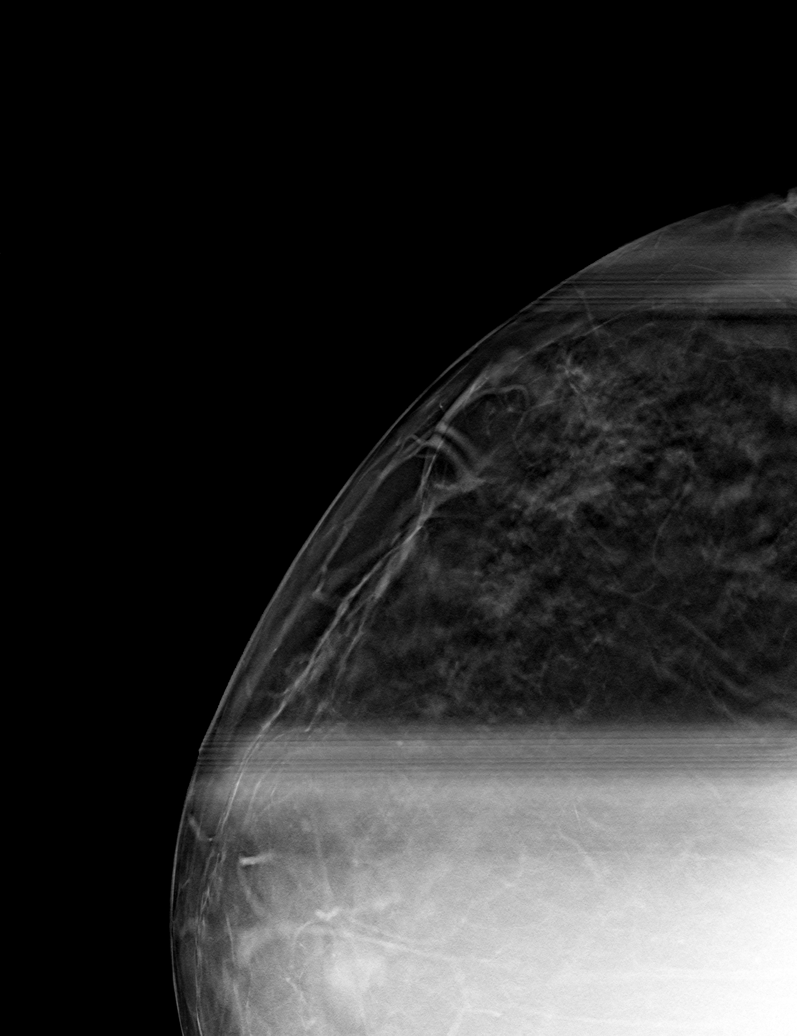

[6 of 18 positions shown; findings below may reference images not displayed]

FINDINGS: Mammogram:

Full field cc and spot compression tomosynthesis views of the right
breast were performed. There is persistence of an ill-defined
asymmetry on the full field cc in the upper outer right breast
measuring approximately 3.0 cm, which is less conspicuous on the
spot imaging. On the spot MLO view there is subtle asymmetry
measuring up to 7.3 cm.

On physical exam in the upper-outer quadrant the right breast I do
not feel a fixed discrete mass or focal area of thickening.

Ultrasound:

Targeted ultrasound is performed throughout the upper-outer quadrant
of the right breast demonstrating no cystic or solid mass correspond
to the asymmetry seen mammographically.

Targeted ultrasound of the right axilla demonstrates normal lymph
nodes.
IMPRESSION: Indeterminate ill-defined asymmetry in the upper outer right breast
spanning at least 3.0 cm. No sonographic correlate.

RECOMMENDATION:
1.  Stereotactic core needle biopsy of the right breast asymmetry.

2. If the biopsy returns as atypia or malignancy, recommend breast
MRI for extent of disease given ill-defined margins of the
asymmetry.

I have discussed the findings and recommendations with the patient
who agrees to proceed with biopsy. The patient will be contacted by
our scheduler to arrange the biopsy appointment.

BI-RADS CATEGORY  4: Suspicious.

## 2023-03-06 ENCOUNTER — Other Ambulatory Visit: Payer: Self-pay

## 2023-03-06 DIAGNOSIS — L02215 Cutaneous abscess of perineum: Secondary | ICD-10-CM | POA: Insufficient documentation

## 2023-03-06 LAB — CBC
HCT: 52.9 % — ABNORMAL HIGH (ref 36.0–46.0)
Hemoglobin: 17.7 g/dL — ABNORMAL HIGH (ref 12.0–15.0)
MCH: 33.1 pg (ref 26.0–34.0)
MCHC: 33.5 g/dL (ref 30.0–36.0)
MCV: 99.1 fL (ref 80.0–100.0)
Platelets: 247 10*3/uL (ref 150–400)
RBC: 5.34 MIL/uL — ABNORMAL HIGH (ref 3.87–5.11)
RDW: 12.7 % (ref 11.5–15.5)
WBC: 10.2 10*3/uL (ref 4.0–10.5)
nRBC: 0 % (ref 0.0–0.2)

## 2023-03-06 LAB — BASIC METABOLIC PANEL
Anion gap: 15 (ref 5–15)
BUN: 7 mg/dL (ref 6–20)
CO2: 20 mmol/L — ABNORMAL LOW (ref 22–32)
Calcium: 8.9 mg/dL (ref 8.9–10.3)
Chloride: 102 mmol/L (ref 98–111)
Creatinine, Ser: 0.87 mg/dL (ref 0.44–1.00)
GFR, Estimated: >60 mL/min (ref >60)
Glucose, Bld: 139 mg/dL — ABNORMAL HIGH (ref 70–99)
Potassium: 3.4 mmol/L — ABNORMAL LOW (ref 3.5–5.1)
Sodium: 137 mmol/L (ref 135–145)

## 2023-03-06 NOTE — ED Triage Notes (Signed)
Pt presents to ER with c/o possible abscess to her inner buttocks. Pt states she feels like the abscess popped around 9/20.  Pt states the area has been very painful since then, and has occasional periods of burning, especially when urinating.  Pt states the area where the abscess was has been draining serosanguinous type drainage.  Pt reports some low grade fevers and chills at home.  Pt is otherwise A&O x4 and in NAD.

## 2023-03-07 ENCOUNTER — Emergency Department
Admission: EM | Admit: 2023-03-07 | Discharge: 2023-03-07 | Disposition: A | Discharge status: Home / Self Care | Payer: BC Managed Care – PPO | Attending: Emergency Medicine | Admitting: Emergency Medicine

## 2023-03-07 ENCOUNTER — Emergency Department: Payer: BC Managed Care – PPO

## 2023-03-07 DIAGNOSIS — K610 Anal abscess: Secondary | ICD-10-CM

## 2023-03-07 MED ORDER — AMOXICILLIN-POT CLAVULANATE 875-125 MG PO TABS: 1.0000 | ORAL_TABLET | Freq: Two times a day (BID) | ORAL | 0 refills | Status: AC

## 2023-03-07 MED ORDER — SODIUM CHLORIDE 0.9 % IV BOLUS
1000.0000 mL | Freq: Once | INTRAVENOUS | Status: AC
  Administered 2023-03-07: 1000 mL via INTRAVENOUS

## 2023-03-07 MED ORDER — IOHEXOL 350 MG/ML SOLN
125.0000 mL | Freq: Once | INTRAVENOUS | Status: AC | PRN
  Administered 2023-03-07: 125 mL via INTRAVENOUS

## 2023-03-07 MED ORDER — AMOXICILLIN-POT CLAVULANATE 875-125 MG PO TABS
1.0000 | ORAL_TABLET | Freq: Once | ORAL | Status: AC
  Administered 2023-03-07: 1 via ORAL
  Filled 2023-03-07: qty 1

## 2023-03-07 MED ORDER — LIDOCAINE-EPINEPHRINE 1 %-1:100000 IJ SOLN
20.0000 mL | Freq: Once | INTRAMUSCULAR | Status: AC
  Administered 2023-03-07: 20 mL
  Filled 2023-03-07: qty 20
  Filled 2023-03-07: qty 1

## 2023-03-07 NOTE — Discharge Instructions (Addendum)
Take Antibiotics for the full course as prescribed.  Take acetaminophen 650 mg and ibuprofen 400 mg every 6 hours for pain.  Take with food.  Thank you for choosing [place] for your health care today!  Please see your primary doctor this week for a follow up appointment.   If you have any new, worsening, or unexpected symptoms call your doctor right away or come back to the emergency department for reevaluation.  It was my pleasure to care for you today.   Daneil Dan Modesto Charon, MD

## 2023-03-07 NOTE — ED Provider Notes (Signed)
[facility] Provider Note    Event Date/Time   First MD Initiated Contact with Patient 03/07/23 0100     (approximate)   History   Abscess   HPI  Linda Ortiz is a 52 y.o. female   Past medical history of blood clotting disorder, PCOS, diverticulitis here with abscess next to her anus.  Developed approximately 4 days ago and spontaneously ruptured oozing pus with relief of pain.  Pain gradually worsened over the last 1 day.  Denies fevers or chills.  Bowel movements have been normal.  No GI bleeding.   External Medical Documents Reviewed: Colonoscopy from 2017 which was largely unremarkable except for diverticulosis      Physical Exam   Triage Vital Signs: ED Triage Vitals  Encounter Vitals Group     BP 03/06/23 1943 (!) 150/92     Systolic BP Percentile --      Diastolic BP Percentile --      Pulse Rate 03/06/23 1943 (!) 110     Resp 03/06/23 1943 20     Temp 03/06/23 1943 98.6 F (37 C)     Temp Source 03/06/23 1943 Oral     SpO2 03/06/23 1943 96 %     Weight 03/06/23 1950 (!) 351 lb (159.2 kg)     Height 03/06/23 1950 5' 3.5" (1.613 m)     Head Circumference --      Peak Flow --      Pain Score 03/06/23 1945 2     Pain Loc --      Pain Education --      Exclude from Growth Chart --     Most recent vital signs: Vitals:   03/06/23 1943  BP: (!) 150/92  Pulse: (!) 110  Resp: 20  Temp: 98.6 F (37 C)  SpO2: 96%    General: Awake, no distress.  CV:  Good peripheral perfusion.  Resp:  Normal effort.  Abd:  No distention.  Other:  There is a open ulceration with induration surrounding on the gluteal cleft surrounding the anus.   ED Results / Procedures / Treatments   Labs (all labs ordered are listed, but only abnormal results are displayed) Labs Reviewed  CBC - Abnormal; Notable for the following components:      Result Value   RBC 5.34 (*)    Hemoglobin 17.7 (*)    HCT 52.9 (*)    All other components within normal  limits  BASIC METABOLIC PANEL - Abnormal; Notable for the following components:   Potassium 3.4 (*)    CO2 20 (*)    Glucose, Bld 139 (*)    All other components within normal limits     I ordered and reviewed the above labs they are notable for no leukocytosis    RADIOLOGY I independently reviewed and interpreted CT of the pelvis and see no obvious large fluid collections I also reviewed radiologist's formal read.   PROCEDURES:  Critical Care performed: No  ..Incision and Drainage  Date/Time: 03/07/2023 5:29 AM  Performed by: Pilar Jarvis, MD Authorized by: Pilar Jarvis, MD   Consent:    Consent obtained:  Verbal   Consent given by:  Patient   Risks, benefits, and alternatives were discussed: yes     Risks discussed:  Bleeding, incomplete drainage, infection and damage to other organs   Alternatives discussed:  No treatment and delayed treatment Universal protocol:    Procedure explained and questions answered to patient or proxy's  satisfaction: yes   Location:    Type:  Abscess   Size:  2   Location:  Anogenital   Anogenital location:  Perianal Pre-procedure details:    Skin preparation:  Chlorhexidine with alcohol Sedation:    Sedation type:  None Anesthesia:    Anesthesia method:  Local infiltration   Local anesthetic:  Lidocaine 1% WITH epi Procedure type:    Complexity:  Complex Procedure details:    Incision types:  Stab incision   Incision depth:  Dermal   Wound management:  Probed and deloculated   Drainage:  Purulent   Drainage amount:  Scant   Wound treatment:  Wound left open   Packing materials:  None    MEDICATIONS ORDERED IN ED: Medications  amoxicillin-clavulanate (AUGMENTIN) 875-125 MG per tablet 1 tablet (has no administration in time range)  sodium chloride 0.9 % bolus 1,000 mL (0 mLs Intravenous Stopped 03/07/23 0528)  lidocaine-EPINEPHrine (XYLOCAINE W/EPI) 1 %-1:100000 (with pres) injection 20 mL (20 mLs Other Given by Other 03/07/23  0137)  iohexol (OMNIPAQUE) 350 MG/ML injection 125 mL (125 mLs Intravenous Contrast Given 03/07/23 0137)    IMPRESSION / MDM / ASSESSMENT AND PLAN / ED COURSE  I reviewed the triage vital signs and the nursing notes.                                Patient's presentation is most consistent with acute presentation with potential threat to life or bodily function.  Differential diagnosis includes, but is not limited to, perirectal abscess, fistula, cellulitis   The patient is on the cardiac monitor to evaluate for evidence of arrhythmia and/or significant heart rate changes.  MDM: Perirectal abscess that spontaneously drained, opened with scant purulence, probed and deloculated.  CT scan shows no large collections or deeper processes , will prescribe antibiotics and have her follow-up with PMD.       FINAL CLINICAL IMPRESSION(S) / ED DIAGNOSES   Final diagnoses:  Perianal abscess     Rx / DC Orders   ED Discharge Orders          Ordered    amoxicillin-clavulanate (AUGMENTIN) 875-125 MG tablet  2 times daily        03/07/23 0527             Note:  This document was prepared using Dragon voice recognition software and may include unintentional dictation errors.    Pilar Jarvis, MD 03/07/23 [phone]

## 2023-03-09 ENCOUNTER — Telehealth: Payer: Self-pay | Admitting: Surgery

## 2023-03-09 NOTE — Telephone Encounter (Signed)
Several attempts to contact patient has been made, left message to call.  Calling to schedule follow up from the ED on 03/07/23 for perianal abscess with Dr. Everlene Farrier.

## 2023-03-21 ENCOUNTER — Encounter: Payer: Self-pay | Admitting: General Surgery

## 2023-03-21 ENCOUNTER — Ambulatory Visit: Payer: BC Managed Care – PPO | Admitting: General Surgery

## 2023-03-21 VITALS — BP 135/93 | HR 88 | Ht 63.5 in | Wt 351.0 lb

## 2023-03-21 DIAGNOSIS — K611 Rectal abscess: Secondary | ICD-10-CM | POA: Diagnosis not present

## 2023-03-22 ENCOUNTER — Encounter: Payer: Self-pay | Admitting: General Surgery

## 2023-03-22 NOTE — Progress Notes (Signed)
Patient ID: Linda Ortiz, female   DOB: [birth year], 52 y.o.   MRN: [id number] CC: Perirectal abscess History of Present Illness Linda Ortiz is a 52 y.o. female with medical history listed below and significant for history of factor V Leiden mutation and she is currently on warfarin.  She reports that approximately 2 and half weeks ago she developed a large boil on her left buttock.  This was very painful but not associated with any bleeding per rectum.  She started to have spontaneous drainage but the pain persisted so she went to the emergency department for evaluation.  There she was continue to have spontaneous drainage and they deloculated the abscess cavity.  She reports since then the pain has improved and she feels like the area is healing up.  Of note she has a family history of inflammatory bowel disease in her sister but has had multiple colonoscopies that did not show as of IBD.  At the present time the patient reports that there is very scant pink drainage from the wound that does not bother her  Past Medical History Past Medical History:  Diagnosis Date   Anxiety    Blood clotting disorder (HCC)    Complication of anesthesia    Depression    Diverticulitis    Dysmenorrhea    Endometriosis    Hematoma of right lower extremity    Menorrhagia    Morbid obesity (HCC)    Pancreatitis, acute    PCOS (polycystic ovarian syndrome)    Pneumonia    PONV (postoperative nausea and vomiting)    General Anesthesia   Rib fracture    Left        Past Surgical History:  Procedure Laterality Date   APPENDECTOMY  2005   ARTHROSCOPY KNEE W/ DRILLING     BARIATRIC SURGERY  2007   BREAST BIOPSY Right 09/11/2020   "Affirm" bx-RIGHT asymmetry-path pending   CHOLECYSTECTOMY N/A 10/19/2015   Procedure: LAPAROSCOPIC CHOLECYSTECTOMY ;  Surgeon: Leafy Ro, MD;  Location: [facility] ORS;  Service: General;  Laterality: N/A;   COLONOSCOPY WITH PROPOFOL N/A 06/03/2016   Procedure: COLONOSCOPY WITH  PROPOFOL;  Surgeon: Christena Deem, MD;  Location: [facility] ENDOSCOPY;  Service: Endoscopy;  Laterality: N/A;   ESOPHAGOGASTRODUODENOSCOPY N/A 05/18/2022   Procedure: ESOPHAGOGASTRODUODENOSCOPY (EGD);  Surgeon: Toledo, Boykin Nearing, MD;  Location: [facility] ENDOSCOPY;  Service: Gastroenterology;  Laterality: N/A;   ESOPHAGOGASTRODUODENOSCOPY (EGD) WITH PROPOFOL N/A 06/03/2016   Procedure: ESOPHAGOGASTRODUODENOSCOPY (EGD) WITH PROPOFOL;  Surgeon: Christena Deem, MD;  Location: [facility] ENDOSCOPY;  Service: Endoscopy;  Laterality: N/A;   EYE SURGERY  2017    Allergies  Allergen Reactions   Morphine Itching    Current Outpatient Medications  Medication Sig Dispense Refill   cetirizine (ZYRTEC) 10 MG tablet Take 10 mg by mouth daily.     Cholecalciferol (VITAMIN D3) 25 MCG (1000 UT) CAPS Take 2,000 Units by mouth daily.     Cyanocobalamin 1000 MCG/ML KIT Inject 1,000 mcg as directed every 30 (thirty) days.     folic acid (FOLVITE) 1 MG tablet Take 1 tablet (1 mg total) by mouth daily. 30 tablet 10   ipratropium-albuterol (DUONEB) 0.5-2.5 (3) MG/3ML SOLN Take 3 mLs by nebulization every 4 (four) hours as needed. 360 mL 1   lamoTRIgine (LAMICTAL) 200 MG tablet Take 1 tablet (200 mg total) by mouth daily. 90 tablet 1   spironolactone (ALDACTONE) 50 MG tablet Take 100 mg by mouth daily.     venlafaxine XR (  EFFEXOR-XR) 150 MG 24 hr capsule TAKE 1 CAPSULE BY MOUTH DAILY 90 capsule 2   warfarin (COUMADIN) 6 MG tablet Take 3 mg by mouth See admin instructions. Take  tablet (3mg ) by mouth every Monday, Tuesday, Thursday, Friday and Saturday morning     No current facility-administered medications for this visit.    Family History Family History  Problem Relation Age of Onset   Arthritis Mother        rheumatoid   Hypertension Mother    Hyperlipidemia Father    Prostate cancer Father    Mental illness Maternal Grandmother        dementia   Alcohol abuse Maternal Grandmother    ALS Paternal Grandmother     Breast cancer Neg Hx        Social History Social History   Tobacco Use   Smoking status: Every Day    Current packs/day: 0.00    Average packs/day: 0.3 packs/day for 8.0 years (2.0 ttl pk-yrs)    Types: Cigarettes    Start date: 09/12/2007    Last attempt to quit: 09/12/2015    Years since quitting: 7.5   Smokeless tobacco: Never  Vaping Use   Vaping status: Never Used  Substance Use Topics   Alcohol use: Yes   Drug use: No        ROS Full ROS of systems performed and is otherwise negative there than what is stated in the HPI  Physical Exam Blood pressure (!) 135/93, pulse 88, height 5' 3.5" (1.613 m), weight (!) 351 lb (159.2 kg), last menstrual period 08/17/2019, SpO2 97%.  No acute distress, pupils equal round and reactive to light accommodation, normal work of breathing on room air, wears glasses, abdomen is obese, soft and nondistended and nontender. Rectal exam performed in the presence of a chaperone.  On the left lateral perianal skin there is a healing abscess cavity 1.  There is no purulence able to be expressed from this and there is no pain upon palpation   Data Reviewed I reviewed her labs as well as CT from her ED visit.  Her CT is significant for some air around the anus consistent with a draining abscess cavity.  I have personally reviewed the patient's imaging and medical records.    Assessment    Linda Ortiz is a 52 year old with a perirectal abscess that had spontaneously drained and is now healing well.  She is having minimal drainage from the wound  Plan    The patient denied talked about the fact that she is healing well from her abscess.  I discussed with her that there is a chance that she may develop a fistula secondary from this perirectal abscess.  She should continue local wound care and we will plan to examine her again in 4 weeks to make sure that the area has adequately healed.   Kandis Cocking 03/22/2023, 4:25 PM

## 2023-03-30 ENCOUNTER — Ambulatory Visit: Payer: BC Managed Care – PPO | Admitting: General Surgery

## 2023-03-30 ENCOUNTER — Encounter: Payer: Self-pay | Admitting: General Surgery

## 2023-03-30 VITALS — BP 150/97 | HR 81 | Temp 97.7°F | Ht 63.5 in | Wt 355.2 lb

## 2023-03-30 DIAGNOSIS — K611 Rectal abscess: Secondary | ICD-10-CM | POA: Diagnosis not present

## 2023-03-30 MED ORDER — SULFAMETHOXAZOLE-TRIMETHOPRIM 400-80 MG PO TABS: 1.0000 | ORAL_TABLET | Freq: Two times a day (BID) | ORAL | 0 refills | Status: DC

## 2023-03-30 NOTE — Patient Instructions (Addendum)
Please pick up your prescription at your pharmacy.   If you have any concerns or questions, please feel free to call our office. See follow up appointment.   Today we have drained your Abscess in the office. The numbing medication will wear off in approximately 4-8 hours. You will have some pain to the area afterwards but should not be as severe as prior to the procedure.  If you have been given antibiotics, please continue to take them after your procedure.  You may take 2 extra strength Tylenol, or 3 regular Ibuprofen tablets every 6 hours as needed for pain and discomfort.  You may shower. First remove all of the packing and wash letting the warm soapy water run over the area, rinse well, and pat dry.   We will see you back as scheduled below.   If you have any questions or concerns prior to your appointment, please call our office and speak with a nurse.  Incision and Drainage Incision and drainage is a surgical procedure to open and drain a fluid-filled sac. The sac may be filled with pus, mucus, or blood. Examples of fluid-filled sacs that may need surgical drainage include cysts, skin infections (abscesses), and red lumps that develop from a ruptured cyst or a small abscess (boils). You may need this procedure if the affected area is large, painful, infected, or not healing well. Tell a health care provider about: Any allergies you have. All medicines you are taking, including vitamins, herbs, eye drops, creams, and over-the-counter medicines. Any problems you or family members have had with anesthetic medicines. Any blood disorders you have. Any surgeries you have had. Any medical conditions you have. Whether you are pregnant or may be pregnant. What are the risks? Generally, this is a safe procedure. However, problems may occur, including: Infection. Bleeding. Allergic reactions to medicines. Scarring.  What happens before the procedure? You may need an ultrasound or other  imaging tests to see how large or deep the fluid-filled sac is. You may have blood tests to check for infection. You may get a tetanus shot. You may be given antibiotic medicine to help prevent infection. Follow instructions from your health care provider about eating or drinking restrictions. Ask your health care provider about: Changing or stopping your regular medicines. This is especially important if you are taking diabetes medicines or blood thinners. Taking medicines such as aspirin and ibuprofen. These medicines can thin your blood. Do not take these medicines before your procedure if your health care provider instructs you not to. Plan to have someone take you home after the procedure. If you will be going home right after the procedure, plan to have someone stay with you for 24 hours. What happens during the procedure? To reduce your risk of infection: Your health care team will wash or sanitize their hands. Your skin will be washed with soap. You will be given one or more of the following: A medicine to help you relax (sedative). A medicine to numb the area (local anesthetic). A medicine to make you fall asleep (general anesthetic). An incision will be made in the top of the fluid-filled sac. The contents of the sac may be squeezed out, or a syringe or tube (catheter) may be used to empty the sac. The catheter may be left in place for several weeks to drain any fluid. Or, your health care provider may stitch open the edges of the incision to make a long-term opening for drainage (marsupialization). The inside of the  sac may be washed out (irrigated) with a sterile solution and packed with gauze before it is covered with a bandage (dressing). The procedure may vary among health care providers and hospitals. What happens after the procedure? Your blood pressure, heart rate, breathing rate, and blood oxygen level will be monitored often until the medicines you were given have worn  off. Do not drive for 24 hours if you received a sedative. This information is not intended to replace advice given to you by your health care provider. Make sure you discuss any questions you have with your health care provider. Document Released: 11/23/2000 Document Revised: 11/05/2015 Document Reviewed: 03/20/2015 Elsevier Interactive Patient Education  2017 [employer]   Incision and Drainage, Care After Refer to this sheet in the next few weeks. These instructions provide you with information about caring for yourself after your procedure. Your health care provider may also give you more specific instructions. Your treatment has been planned according to current medical practices, but problems sometimes occur. Call your health care provider if you have any problems or questions after your procedure. What can I expect after the procedure? After the procedure, it is common to have: Pain or discomfort around your incision site. Drainage from your incision.  Follow these instructions at home: Take over-the-counter and prescription medicines only as told by your health care provider. If you were prescribed an antibiotic medicine, take it as told by your health care provider. Do not stop taking the antibiotic even if you start to feel better. Follow instructions from your health care provider about: How to take care of your incision. When and how you should change your packing and bandage (dressing). Wash your hands with soap and water before you change your dressing. If soap and water are not available, use hand sanitizer. When you should remove your dressing. Do not take baths, swim, or use a hot tub until your health care provider approves. Keep all follow-up visits as told by your health care provider. This is important. Check your incision area every day for signs of infection. Check for: More redness, swelling, or pain. More fluid or blood. Warmth. Pus or a bad smell. Contact a  health care provider if: Your cyst or abscess returns. You have a fever. You have more redness, swelling, or pain around your incision. You have more fluid or blood coming from your incision. Your incision feels warm to the touch. You have pus or a bad smell coming from your incision. Get help right away if: You have severe pain or bleeding. You cannot eat or drink without vomiting. You have decreased urine output. You become short of breath. You have chest pain. You cough up blood. The area where the incision and drainage occurred becomes numb or it tingles. This information is not intended to replace advice given to you by your health care provider. Make sure you discuss any questions you have with your health care provider. Document Released: 08/22/2011 Document Revised: 10/30/2015 Document Reviewed: 03/20/2015 Elsevier Interactive Patient Education  2017 [employer].

## 2023-04-03 NOTE — Progress Notes (Signed)
CC: Perirectal Abscess Follow Up  S: The patient returns today because she has increased pain and swelling in the area of previous perirectal abscess.  The pain has recurred over the last several days and she feels like if she presses on it it does drain some pus.  She also notes the pressure pain but does not radiate.  She is not having any problems with bowel movement and denies seeing any blood in her stool.  O: BP (!) 150/97 (BP Location: Right Arm, Patient Position: Sitting, Cuff Size: Large)   Pulse 81   Temp 97.7 F (36.5 C) (Oral)   Ht 5' 3.5" (1.613 m)   Wt (!) 355 lb 3.2 oz (161.1 kg)   LMP 08/17/2019 (Approximate)   SpO2 98%   BMI 61.93 kg/m    Rectal exam performed in the presence of a chaperone.  At the left inferior pararectal skin there is swelling with fluctuance and surrounding erythema.  This is painful to touch and consistent with a recurrent perirectal abscess.  A/P: The patient is 52 year old with past medical history significant for PE who is currently on warfarin who has a recurrent perirectal abscess.  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  We will plan for an office incision and drainage.  I discussed the risk, benefits alternatives of the procedure including the risk of bleeding especially since she is on the warfarin.  I also discussed with her the risk of recurrence given that she likely has a fistula.  We will try to let the acute abscess heal and then she will need to go to the operating room for a exam under anesthesia with possible fistulotomy or seton placement.   Procedure Note: Informed consent was obtained by the patient.  The perianal skin was then cleaned with chlorhexidine solution.  The skin was infiltrated with 1% lidocaine with epinephrine for patient comfort.  Over the area of most fluctuance an incision was made and taken down into the abscess cavity.  There was drainage of  approximately 2 cc of pus.  A hemostat was used to break up all loculations.  The wound was then packed with iodoform gauze.  The patient will remove the packing in 2 days and dressed the wound.  I will also send her home with Bactrim given the surrounding erythema

## 2023-04-06 ENCOUNTER — Ambulatory Visit (INDEPENDENT_AMBULATORY_CARE_PROVIDER_SITE_OTHER): Payer: BC Managed Care – PPO | Admitting: General Surgery

## 2023-04-06 ENCOUNTER — Encounter: Payer: Self-pay | Admitting: General Surgery

## 2023-04-06 ENCOUNTER — Ambulatory Visit
Admission: RE | Admit: 2023-04-06 | Discharge: 2023-04-06 | Disposition: A | Discharge status: Home / Self Care | Payer: BC Managed Care – PPO | Source: Ambulatory Visit | Attending: General Surgery | Admitting: General Surgery

## 2023-04-06 ENCOUNTER — Other Ambulatory Visit
Admission: RE | Admit: 2023-04-06 | Discharge: 2023-04-06 | Disposition: A | Discharge status: Home / Self Care | Payer: BC Managed Care – PPO | Source: Ambulatory Visit | Attending: General Surgery | Admitting: General Surgery

## 2023-04-06 VITALS — BP 139/95 | HR 79 | Temp 98.1°F | Ht 63.5 in | Wt 351.4 lb

## 2023-04-06 DIAGNOSIS — Z7901 Long term (current) use of anticoagulants: Secondary | ICD-10-CM | POA: Diagnosis present

## 2023-04-06 DIAGNOSIS — K611 Rectal abscess: Secondary | ICD-10-CM | POA: Insufficient documentation

## 2023-04-06 LAB — CBC
HCT: 49.8 % — ABNORMAL HIGH (ref 36.0–46.0)
Hemoglobin: 17.2 g/dL — ABNORMAL HIGH (ref 12.0–15.0)
MCH: 33.6 pg (ref 26.0–34.0)
MCHC: 34.5 g/dL (ref 30.0–36.0)
MCV: 97.3 fL (ref 80.0–100.0)
Platelets: 220 10*3/uL (ref 150–400)
RBC: 5.12 MIL/uL — ABNORMAL HIGH (ref 3.87–5.11)
RDW: 13.4 % (ref 11.5–15.5)
WBC: 8.4 10*3/uL (ref 4.0–10.5)
nRBC: 0 % (ref 0.0–0.2)

## 2023-04-06 LAB — BASIC METABOLIC PANEL
Anion gap: 10 (ref 5–15)
BUN: 9 mg/dL (ref 6–20)
CO2: 26 mmol/L (ref 22–32)
Calcium: 8.8 mg/dL — ABNORMAL LOW (ref 8.9–10.3)
Chloride: 96 mmol/L — ABNORMAL LOW (ref 98–111)
Creatinine, Ser: 0.8 mg/dL (ref 0.44–1.00)
GFR, Estimated: >60 mL/min (ref >60)
Glucose, Bld: 96 mg/dL (ref 70–99)
Potassium: 4.2 mmol/L (ref 3.5–5.1)
Sodium: 132 mmol/L — ABNORMAL LOW (ref 135–145)

## 2023-04-06 LAB — PROTIME-INR
INR: 1.4 — ABNORMAL HIGH (ref 0.8–1.2)
Prothrombin Time: 17.4 s — ABNORMAL HIGH (ref 11.4–15.2)

## 2023-04-06 MED ORDER — IOHEXOL 300 MG/ML  SOLN
100.0000 mL | Freq: Once | INTRAMUSCULAR | Status: AC | PRN
  Administered 2023-04-06: 100 mL via INTRAVENOUS

## 2023-04-06 MED ORDER — IOHEXOL 9 MG/ML PO SOLN: 500.0000 mL | ORAL | Status: DC

## 2023-04-06 NOTE — Patient Instructions (Addendum)
Your CT is scheduled for today after you leave clinic.   Anorectal Abscess An abscess is an infected area that contains pus. An anorectal abscess is an abscess that forms near the opening of the butt (anus) or around the rectum. If it is not treated, the abscess can grow and cause other problems. These problems may include a more severe body-wide infection or pain, especially when you poop. What are the causes? An anorectal abscess is caused by clogged glands or an infection in: The anus. The area between the anus and the scrotum in males or between the anus and the vagina in females (perineum). What increases the risk? You may be more likely to get this condition if: You are pregnant. You have diabetes or an inflammatory bowel disease, such as Crohn's disease. You have had anal sex. You have certain cancers. These include rectal cancer, leukemia, or lymphoma. You have been given medicines to kill cancer cells (chemotherapy). You have cracks in your anus (anal fissures). These cracks may form if you have constipation that lasts for a long time or does not go away. You have a sexually transmitted infection (STI). What are the signs or symptoms? The main symptom of this condition is pain. It may be a throbbing pain that gets worse when you poop. Other symptoms include: Swelling, redness, and pus near the anus. The redness may go beyond the abscess and look like a red streak on the skin. A painful lump or tissue near the anus. Bleeding or pus-like discharge from the area. Fever or night sweats. Weakness and tiredness (fatigue). Constipation or pain when pooping. Pain in the lower part of your stomach. How is this diagnosed? This condition is diagnosed based on your medical history and a physical exam. The exam may include: A digital rectal exam. This is when your health care provider puts a gloved finger into your anus and rectum to look for problems. A vaginal exam. This is when your  provider looks at the vagina for problems. Using a tube with a light and camera on the end (scope) to look at the rectum. You may also need tests, such as an MRI or CT scan. How is this treated? Treatment for this condition may include: Incision and drainage of the abscess. This is when an incision is made over the abscess to drain the pus. Medicines. These may include pain medicines, medicine to help you poop (laxatives), stool softeners, or antibiotics. Follow these instructions at home: Medicines Take over-the-counter and prescription medicines only as told by your provider. If you were prescribed antibiotics, use them as told by your provider. Do not stop using the antibiotic even if you start to feel better. Ask your provider if the medicine prescribed to you requires you to avoid driving or using machinery. Wound care  Follow instructions from your provider about how to take care of your wound. Make sure you: Wash your hands with soap and water for at least 20 seconds before and after you change your bandage (dressing). If soap and water are not available, use hand sanitizer. Change your dressing as told by your provider. Leave stitches (sutures), skin glue, or tape strips in place. These skin closures may need to stay in place for 2 weeks or longer. If tape strip edges start to loosen and curl up, you may trim the loose edges. Do not remove tape strips completely unless your provider tells you to do that. If one or more tubes (drains) were placed to drain  the pus, be careful not to pull at them. Your provider will tell you how long they need to stay in place. Check your abscess area every day for signs of infection. Check for: More redness, swelling, or pain. More fluid or blood. Warmth. Pus or a bad smell. Managing pain, stiffness, and swelling  To help with pain, try sitting: On a heating pad with the setting on low. On an inflatable donut-shaped cushion. If told, put ice on the  affected area. Put ice in a plastic bag. Place a towel between your skin and the bag. Leave the ice on for 20 minutes, 2-3 times a day. If your skin turns bright red, remove the ice right away to prevent skin damage. The risk of damage is higher if you cannot feel pain, heat, or cold. General instructions Take a sitz bath 3-4 times a day and after you poop. A sitz bath is a shallow, warm water bath that you sit down in. The water should only come up to your hips and should cover your butt. Follow instructions from your provider about what you may eat and drink. Keep all follow-up visits. Your provider may need to remove stitches and make sure that the abscess has gone away. Where to find more information American Society of Colon [employer] Rect[employer]: fascrs.org Contact a health care provider if: You have any signs of an infection. You have a fever or chills. You have trouble pooping. Get help right away if: You have trouble moving or using your legs. You have severe pain or pain that gets worse. You have swelling that gets worse. You have a lot more bleeding or passing of pus. This information is not intended to replace advice given to you by your health care provider. Make sure you discuss any questions you have with your health care provider. Document Revised: 01/19/2022 Document Reviewed: 01/19/2022 Elsevier Patient Education  2024 [employer].

## 2023-04-06 NOTE — Progress Notes (Signed)
CC: Perirectal abscess  S: The patient returns today after having an incision and drainage of a perirectal abscess at her last visit.  She reports that over the last 2 days she has had increased pain in the area associated with some blood on her toilet paper.  She says that there is also been drainage of much more pus over the last 2 days.  Her pain is on the left buttock and is quite severe.  She denies any incontinence but did states she had some diarrhea this morning.  She has not had any fevers but has had subjective chills.  She also reports feeling nauseated over the last 2 weeks but denies any vomiting.  O: Vital signs reviewed she is not febrile at this time.  She is also not tachycardic and her blood pressure is within normal limits.  On exam area of previous I&D is healing well and there is no surrounding erythema.  There is no fluctuance at the area of the incision and I was not able to express any pus from the wound.  She does have some tenderness posterior to this but I do not see obvious area of fluctuance.  She does have some pain upon palpation tracking towards her labia but there is no overlying cellulitis or hardness to the tissue to suggest necrotizing soft tissue infection.  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  She has had worsening of pain with nausea over the last 2 days and drainage from the incision and drainage site that I did last week.  While her exam overall is not that concerning I do think that we should get a CT scan to ensure that there is no undrained fluid collection.  I will also send her for labs including a CBC to assess her white count and a BMP to check her sodium in case there is concern for necrotizing soft tissue infection.  We also be able to see her creatinine.  I will also add an INR given that she is on warfarin and in case that she needs operative  intervention.  Total time spent: 45 minutes

## 2023-04-18 ENCOUNTER — Ambulatory Visit: Payer: BC Managed Care – PPO | Admitting: General Surgery

## 2023-04-20 ENCOUNTER — Ambulatory Visit (INDEPENDENT_AMBULATORY_CARE_PROVIDER_SITE_OTHER): Payer: BC Managed Care – PPO | Admitting: General Surgery

## 2023-04-20 ENCOUNTER — Encounter: Payer: Self-pay | Admitting: General Surgery

## 2023-04-20 VITALS — BP 135/88 | HR 89 | Temp 98.0°F | Ht 63.5 in | Wt 350.0 lb

## 2023-04-20 DIAGNOSIS — K611 Rectal abscess: Secondary | ICD-10-CM

## 2023-04-20 NOTE — Patient Instructions (Addendum)
You have requested to have an exam under anesthesia with procedure. This will be done at [facility] with Dr Maurine Minister.  You will most likely be out of work 3-4 days for this surgery.  If you have FMLA or disability paperwork that needs filled out you may drop this off at our office or this can be faxed to [phone].  Please see the (blue)pre-care form that you have been given today. Our surgery scheduler will call you to verify surgery date and to go over information.   If you have any questions, please call our office.   Botox is injected close to or into the internal sphincter muscle. The injection works by causing the muscle to become paralysed and thereby reducing the amount of spasm or contraction that it undergoes in conjunction with a fissure. This enforces relaxation and allows more blood to the area to heal the fissure. The Botox lasts in your system for between 8-12 weeks. This will usually allow the fissure enough time to heal. It is usually well tolerated but does require a general anaesthetic to administer. This will also allow the surgeon to examine you in some detail, which may be too uncomfortable in the clinic. As it is well tolerated, people who fail to heal on the first injection may often be offered repeated injections in an attempt to heal the fissure. If the fissure still continues to be a problem despite repeated Botox injections, then further procedures may be considered depending on your case. Due to the weakening of the muscle, some people do experience a degree of urgency or incontinence after the injection, although this is temporary as the Botox does wear off.   Anal Fissure, Adult  An anal fissure is a small tear or crack in the tissue around the opening of the butt (anus). Bleeding from the tear or crack usually stops on its own within a few minutes. The bleeding may happen every time you poop (have a bowel movement) until the tear or crack heals. What are the  causes? This condition is usually caused by passing a large or hard poop (stool). Other causes include: Trouble pooping (constipation). Passing watery poop (diarrhea). Inflammatory bowel disease (Crohn's disease or ulcerative colitis). Childbirth. Infections. Anal sex. What are the signs or symptoms? Symptoms of this condition include: Bleeding from the butt. Small amounts of blood on your poop. The blood coats the outside of the poop. It is not mixed with the poop. Small amounts of blood on the toilet paper or in the toilet after you poop. Pain when passing poop. Itching or irritation around the opening of the butt. How is this diagnosed? This condition may be diagnosed based on a physical exam. Your doctor may: Check your butt. A tear can often be seen by checking the area with care. Check your butt using a short tube (anoscope). The light in the tube will show any problems in your butt. How is this treated? Treatment for this condition may include: Treating problems that make it hard for you to pass poop. You may be told to: Eat more fiber. Drink more fluid. Take fiber supplements. Take medicines that make poop soft. Taking warm water baths (sitz baths). This may help to heal the tear. Using creams and ointments. If your condition gets worse, other treatments may be needed such as: A shot near the tear or crack (botulinum injection). Surgery to repair the tear or crack. Follow these instructions at home: Eating and drinking  Avoid bananas  and dairy products. These foods can make it hard to poop. Drink enough fluid to keep your pee (urine) pale yellow. Eat foods that have a lot of fiber in them, such as: Beans. Whole grains. Fresh fruits. Fresh vegetables. General instructions  Take over-the-counter and prescription medicines only as told by your doctor. Use creams or ointments only as told by your doctor. Keep the butt area as clean and dry as you can. Take a warm  water bath as told by your doctor. Do not use soap. Keep all follow-up visits as told by your doctor. This is important. Contact a doctor if: You have more bleeding. You have a fever. You have watery poop that is mixed with blood. You have pain. Your problem gets worse, not better. Summary An anal fissure is a small tear or crack in the skin around the opening of the butt (anus). This condition is usually caused by passing a large or hard poop (stool). Treatment includes treating the problems that make it hard for you to pass poop. Follow your doctor's instructions about caring for your condition at home. Keep all follow-up visits as told by your doctor. This is important. This information is not intended to replace advice given to you by your health care provider. Make sure you discuss any questions you have with your health care provider. Document Revised: 12/24/2020 Document Reviewed: 12/24/2020 Elsevier Patient Education  2023 [employer].

## 2023-04-21 ENCOUNTER — Ambulatory Visit: Payer: Self-pay | Admitting: General Surgery

## 2023-04-21 ENCOUNTER — Telehealth: Payer: Self-pay | Admitting: General Surgery

## 2023-04-21 DIAGNOSIS — K603 Anal fistula, unspecified: Secondary | ICD-10-CM

## 2023-04-21 NOTE — Telephone Encounter (Signed)
Left message for patient to call, please inform her of the following regarding scheduled surgery with Dr. Maurine Minister   Pre-Admission date/time, and Surgery date at [facility].  Surgery Date: 04/26/23 Preadmission Testing Date: 04/25/23 (phone 8a-1p)  Also patient will need to call at [phone], between 1-3:00pm the day before surgery, to find out what time to arrive for surgery.

## 2023-04-24 NOTE — Telephone Encounter (Signed)
Patient calls back, she is now informed of all dates regarding surgery.  

## 2023-04-24 NOTE — Progress Notes (Unsigned)
Medical Clearance has been received from Dr Graciela Husbands. She is cleared at low risk for surgery. She may hold her Warfarin for 5 days prior to surgery.

## 2023-04-24 NOTE — Progress Notes (Signed)
Outpatient Surgical Follow Up  04/24/2023  ERNESTINA FLASH is an 52 y.o. female.   Peri-rectal abscess   HPI: Ms. Brooking is a 52 year old returns today to discuss her perirectal abscess.  Last time she was seen in the office she had increased pain and a CT scan was done.  This showed improvement in the inflammation without any drainable collections.  She says that she had improvement in her pain and was doing quite well.  However several days ago she had episode of diarrhea and she felt like there was drainage from this site of her perirectal abscess.  She has continued pain right where the abscess was but denies any fevers or chills.  Over the last several days she has continued ongoing drainage from the perirectal abscess.  Past Medical History:  Diagnosis Date   Anxiety    Blood clotting disorder (HCC)    Complication of anesthesia    Depression    Diverticulitis    Dysmenorrhea    Endometriosis    Hematoma of right lower extremity    Menorrhagia    Morbid obesity (HCC)    Pancreatitis, acute    PCOS (polycystic ovarian syndrome)    Pneumonia    PONV (postoperative nausea and vomiting)    General Anesthesia   Rib fracture    Left     Past Surgical History:  Procedure Laterality Date   APPENDECTOMY  2005   ARTHROSCOPY KNEE W/ DRILLING     BARIATRIC SURGERY  2007   BREAST BIOPSY Right 09/11/2020   "Affirm" bx-RIGHT asymmetry-path pending   CHOLECYSTECTOMY N/A 10/19/2015   Procedure: LAPAROSCOPIC CHOLECYSTECTOMY ;  Surgeon: Leafy Ro, MD;  Location: [facility] ORS;  Service: General;  Laterality: N/A;   COLONOSCOPY WITH PROPOFOL N/A 06/03/2016   Procedure: COLONOSCOPY WITH PROPOFOL;  Surgeon: Christena Deem, MD;  Location: [facility] ENDOSCOPY;  Service: Endoscopy;  Laterality: N/A;   ESOPHAGOGASTRODUODENOSCOPY N/A 05/18/2022   Procedure: ESOPHAGOGASTRODUODENOSCOPY (EGD);  Surgeon: Toledo, Boykin Nearing, MD;  Location: [facility] ENDOSCOPY;  Service: Gastroenterology;  Laterality: N/A;    ESOPHAGOGASTRODUODENOSCOPY (EGD) WITH PROPOFOL N/A 06/03/2016   Procedure: ESOPHAGOGASTRODUODENOSCOPY (EGD) WITH PROPOFOL;  Surgeon: Christena Deem, MD;  Location: [facility] ENDOSCOPY;  Service: Endoscopy;  Laterality: N/A;   EYE SURGERY  2017    Family History  Problem Relation Age of Onset   Arthritis Mother        rheumatoid   Hypertension Mother    Hyperlipidemia Father    Prostate cancer Father    Mental illness Maternal Grandmother        dementia   Alcohol abuse Maternal Grandmother    ALS Paternal Grandmother    Breast cancer Neg Hx     Social History:  reports that she has been smoking cigarettes. She started smoking about 15 years ago. She has a 2 pack-year smoking history. She has never used smokeless tobacco. She reports current alcohol use. She reports that she does not use drugs.  Allergies:  Allergies  Allergen Reactions   Morphine Itching    Medications reviewed.    ROS Full ROS performed and is otherwise negative other than what is stated in HPI   BP 135/88   Pulse 89   Temp 98 F (36.7 C)   Ht 5' 3.5" (1.613 m)   Wt (!) 350 lb (158.8 kg)   LMP 08/17/2019 (Approximate)   SpO2 96%   BMI 61.03 kg/m   Physical Exam Alert and oriented x 3, no acute distress, normal  work of breathing on room air, moving all extremities spontaneously   I independently reviewed her CT scan from her last visit that I ordered.  There is still some inflammation in the left perianal space but there is no continued fluid collection.  Her last INR was 1.4. 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  I discussed with her that the fistula tract is the reason she feels like there is drainage from around her bottom.  Given that she has been treated with I&D several weeks ago I think is appropriate to proceed with an exam under anesthesia and possible fistulotomy versus seton placement.  I discussed with her that  the treatment will depend on how much of the sphincter complex is involved other than the fistula tract.  I also discussed the risk, benefits and alternatives of the procedure including infection, bleeding and incontinence.  She is on warfarin given her factor V Leiden mutation and I will have her PCP who prescribes her warfarin weigh in on when to stop this.  However, tentatively I have asked her to stop her warfarin 4 days prior to surgery  Baker Pierini, M.D. [facility]

## 2023-04-25 ENCOUNTER — Telehealth: Payer: Self-pay | Admitting: General Surgery

## 2023-04-25 ENCOUNTER — Other Ambulatory Visit: Payer: Self-pay

## 2023-04-25 ENCOUNTER — Encounter: Payer: Self-pay | Admitting: General Surgery

## 2023-04-25 ENCOUNTER — Encounter
Admission: RE | Admit: 2023-04-25 | Discharge: 2023-04-25 | Disposition: A | Discharge status: Home / Self Care | Payer: BC Managed Care – PPO | Source: Ambulatory Visit | Attending: General Surgery | Admitting: General Surgery

## 2023-04-25 VITALS — Ht 63.5 in | Wt 350.0 lb

## 2023-04-25 DIAGNOSIS — Z9229 Personal history of other drug therapy: Secondary | ICD-10-CM

## 2023-04-25 HISTORY — DX: Activated protein C resistance: D68.51

## 2023-04-25 NOTE — Patient Instructions (Addendum)
Your procedure is scheduled on: Wednesday 04/26/23 To find out your arrival time, please call [phone] between 1PM - 3PM on:   Today 04/25/23 Report to the Registration Desk on the 1st floor of the [facility]. Free Valet parking is available.  If your arrival time is 6:00 am, do not arrive before that time as the [facility] entrance doors do not open until 6:00 am.  REMEMBER: Instructions that are not followed completely may result in serious medical risk, up to and including death; or upon the discretion of your surgeon and anesthesiologist your surgery may need to be rescheduled.  Do not eat food or drink any liquids after midnight the night before surgery.  No gum chewing or hard candies.  One week prior to surgery: Stop Anti-inflammatories (NSAIDS) such as Advil, Aleve, Ibuprofen, Motrin, Naproxen, Naprosyn and Aspirin based products such as Excedrin, Goody's Powder, BC Powder. You may however, continue to take Tylenol if needed for pain up until the day of surgery.  Stop ANY OVER THE COUNTER supplements and vitamins until after surgery.  Continue taking all prescribed medications with the exception of the following: Wafarin, hold as instructed  TAKE ONLY THESE MEDICATIONS THE MORNING OF SURGERY WITH A SIP OF WATER:  cetirizine (ZYRTEC) 10 MG tablet  lamoTRIgine (LAMICTAL) 200 MG tablet  venlafaxine XR (EFFEXOR-XR) 150 MG 24 hr capsule    Use your nebulizer before coming for surgery 04/26/23  No Alcohol for 24 hours before or after surgery.  No Smoking including e-cigarettes for 24 hours before surgery.  No chewable tobacco products for at least 6 hours before surgery.  No nicotine patches on the day of surgery.  Do not use any "recreational" drugs for at least a week (preferably 2 weeks) before your surgery.  Please be advised that the combination of cocaine and anesthesia may have negative outcomes, up to and including death. If you test positive for cocaine, your  surgery will be cancelled.  On the morning of surgery brush your teeth with toothpaste and water, you may rinse your mouth with mouthwash if you wish. Do not swallow any toothpaste or mouthwash.  Use CHG Soap or wipes as directed on instruction sheet. Shower with your regular soap.  Do not wear lotions, powders, or perfumes.   Do not shave body hair from the neck down 48 hours before surgery.  Wear clean comfortable clothing (specific to your surgery type) to the hospital.  Do not wear jewelry, make-up, hairpins, clips or nail polish.  For welded (permanent) jewelry: bracelets, anklets, waist bands, etc.  Please have this removed prior to surgery.  If it is not removed, there is a chance that hospital personnel will need to cut it off on the day of surgery. Contact lenses, hearing aids and dentures may not be worn into surgery.  Do not bring valuables to the hospital. [facility] is not responsible for any missing/lost belongings or valuables.   Notify your doctor if there is any change in your medical condition (cold, fever, infection).  If you are being discharged the day of surgery, you will not be allowed to drive home. You will need a responsible individual to drive you home and stay with you for 24 hours after surgery.   If you are taking public transportation, you will need to have a responsible individual with you.  If you are being admitted to the hospital overnight, leave your suitcase in the car. After surgery it may be brought to your room.  In case of increased patient census, it may be necessary for you, the patient, to continue your postoperative care in the Same Day Surgery department.  After surgery, you can help prevent lung complications by doing breathing exercises.  Take deep breaths and cough every 1-2 hours. Your doctor may order a device called an Incentive Spirometer to help you take deep breaths. When coughing or sneezing, hold a pillow firmly against your  incision with both hands. This is called "splinting." Doing this helps protect your incision. It also decreases belly discomfort.  Surgery Visitation Policy:  Patients undergoing a surgery or procedure may have two family members or support persons with them as long as the person is not COVID-19 positive or experiencing its symptoms.   Inpatient Visitation:    Visiting hours are 7 a.m. to 8 p.m. Up to four visitors are allowed at one time in a patient room. The visitors may rotate out with other people during the day. One designated support person (adult) may remain overnight.  Please call the Pre-admissions Testing Dept. at [phone] if you have any questions about these instructions.

## 2023-04-25 NOTE — Telephone Encounter (Signed)
Work note sent to patient's MyChart.

## 2023-04-25 NOTE — Telephone Encounter (Signed)
Patient states that she had to stay out of work today because of pain and drainage.  She has perirectal abscess and is having surgery on Wednesday 04/26/23 with Dr. Maurine Minister.  Her work asking for a work note for today and if can be sent to her MyChart and she will print from there.   Please call her. Thank you

## 2023-04-26 ENCOUNTER — Ambulatory Visit: Payer: Self-pay | Admitting: Urgent Care

## 2023-04-26 ENCOUNTER — Other Ambulatory Visit: Payer: Self-pay

## 2023-04-26 ENCOUNTER — Encounter: Payer: Self-pay | Admitting: General Surgery

## 2023-04-26 ENCOUNTER — Ambulatory Visit: Payer: BC Managed Care – PPO | Admitting: Urgent Care

## 2023-04-26 ENCOUNTER — Encounter
Admission: RE | Disposition: A | Discharge status: Home / Self Care | Payer: Self-pay | Source: Home / Self Care | Attending: General Surgery

## 2023-04-26 ENCOUNTER — Ambulatory Visit
Admission: RE | Admit: 2023-04-26 | Discharge: 2023-04-26 | Disposition: A | Discharge status: Home / Self Care | Payer: BC Managed Care – PPO | Attending: General Surgery | Admitting: General Surgery

## 2023-04-26 DIAGNOSIS — Z86718 Personal history of other venous thrombosis and embolism: Secondary | ICD-10-CM | POA: Diagnosis not present

## 2023-04-26 DIAGNOSIS — Z6841 Body Mass Index (BMI) 40.0 and over, adult: Secondary | ICD-10-CM | POA: Insufficient documentation

## 2023-04-26 DIAGNOSIS — K603 Anal fistula, unspecified: Secondary | ICD-10-CM | POA: Diagnosis present

## 2023-04-26 DIAGNOSIS — D6851 Activated protein C resistance: Secondary | ICD-10-CM | POA: Diagnosis not present

## 2023-04-26 DIAGNOSIS — Z86711 Personal history of pulmonary embolism: Secondary | ICD-10-CM | POA: Insufficient documentation

## 2023-04-26 DIAGNOSIS — Z9884 Bariatric surgery status: Secondary | ICD-10-CM | POA: Insufficient documentation

## 2023-04-26 DIAGNOSIS — Z7901 Long term (current) use of anticoagulants: Secondary | ICD-10-CM | POA: Diagnosis not present

## 2023-04-26 DIAGNOSIS — K641 Second degree hemorrhoids: Secondary | ICD-10-CM | POA: Diagnosis not present

## 2023-04-26 DIAGNOSIS — F17210 Nicotine dependence, cigarettes, uncomplicated: Secondary | ICD-10-CM | POA: Diagnosis not present

## 2023-04-26 DIAGNOSIS — Z9229 Personal history of other drug therapy: Secondary | ICD-10-CM

## 2023-04-26 HISTORY — DX: Long term (current) use of anticoagulants: Z79.01

## 2023-04-26 HISTORY — DX: Acute embolism and thrombosis of unspecified deep veins of unspecified lower extremity: I82.409

## 2023-04-26 HISTORY — PX: FISTULOTOMY: SHX6413

## 2023-04-26 LAB — PROTIME-INR
INR: 1.1 (ref 0.8–1.2)
Prothrombin Time: 14.6 s (ref 11.4–15.2)

## 2023-04-26 SURGERY — EXAM UNDER ANESTHESIA
Anesthesia: General | Site: Rectum

## 2023-04-26 MED ORDER — GELATIN ABSORBABLE 100 CM EX MISC
CUTANEOUS | Status: AC
  Filled 2023-04-26: qty 1

## 2023-04-26 MED ORDER — BUPIVACAINE LIPOSOME 1.3 % IJ SUSP
INTRAMUSCULAR | Status: AC
  Filled 2023-04-26: qty 20

## 2023-04-26 MED ORDER — OXYCODONE HCL 5 MG PO TABS: 5.0000 mg | ORAL_TABLET | Freq: Four times a day (QID) | ORAL | 0 refills | Status: DC | PRN

## 2023-04-26 MED ORDER — LIDOCAINE HCL (CARDIAC) PF 100 MG/5ML IV SOSY
PREFILLED_SYRINGE | INTRAVENOUS | Status: DC | PRN
  Administered 2023-04-26: 60 mg via INTRAVENOUS

## 2023-04-26 MED ORDER — BUPIVACAINE LIPOSOME 1.3 % IJ SUSP
INTRAMUSCULAR | Status: DC | PRN
  Administered 2023-04-26: 20 mL

## 2023-04-26 MED ORDER — ONDANSETRON HCL 4 MG/2ML IJ SOLN
INTRAMUSCULAR | Status: DC | PRN
  Administered 2023-04-26: 4 mg via INTRAVENOUS

## 2023-04-26 MED ORDER — FENTANYL CITRATE (PF) 100 MCG/2ML IJ SOLN: 25.0000 ug | INTRAMUSCULAR | Status: DC | PRN

## 2023-04-26 MED ORDER — CHLORHEXIDINE GLUCONATE 0.12 % MT SOLN
OROMUCOSAL | Status: AC
  Filled 2023-04-26: qty 15

## 2023-04-26 MED ORDER — MIDAZOLAM HCL 2 MG/2ML IJ SOLN
INTRAMUSCULAR | Status: AC
  Filled 2023-04-26: qty 2

## 2023-04-26 MED ORDER — GELATIN ABSORBABLE 100 EX MISC
CUTANEOUS | Status: DC | PRN
  Administered 2023-04-26: 1 via TOPICAL

## 2023-04-26 MED ORDER — PROPOFOL 500 MG/50ML IV EMUL
INTRAVENOUS | Status: DC | PRN
  Administered 2023-04-26: 120 ug/kg/min via INTRAVENOUS

## 2023-04-26 MED ORDER — MIDAZOLAM HCL 2 MG/2ML IJ SOLN
INTRAMUSCULAR | Status: DC | PRN
  Administered 2023-04-26: 2 mg via INTRAVENOUS

## 2023-04-26 MED ORDER — SODIUM CHLORIDE 0.9 % IV SOLN
2.0000 g | INTRAVENOUS | Status: AC
  Administered 2023-04-26: 2 g via INTRAVENOUS

## 2023-04-26 MED ORDER — FENTANYL CITRATE (PF) 100 MCG/2ML IJ SOLN
INTRAMUSCULAR | Status: DC | PRN
  Administered 2023-04-26 (×2): 50 ug via INTRAVENOUS

## 2023-04-26 MED ORDER — IPRATROPIUM-ALBUTEROL 0.5-2.5 (3) MG/3ML IN SOLN: 3.0000 mL | Freq: Once | RESPIRATORY_TRACT | Status: DC

## 2023-04-26 MED ORDER — FENTANYL CITRATE (PF) 100 MCG/2ML IJ SOLN
INTRAMUSCULAR | Status: AC
  Filled 2023-04-26: qty 2

## 2023-04-26 MED ORDER — DOCUSATE SODIUM 100 MG PO CAPS: 100.0000 mg | ORAL_CAPSULE | Freq: Every day | ORAL | 2 refills | Status: AC | PRN

## 2023-04-26 MED ORDER — PROPOFOL 1000 MG/100ML IV EMUL
INTRAVENOUS | Status: AC
  Filled 2023-04-26: qty 100

## 2023-04-26 MED ORDER — OXYCODONE HCL 5 MG PO TABS: 5.0000 mg | ORAL_TABLET | Freq: Once | ORAL | Status: DC | PRN

## 2023-04-26 MED ORDER — SODIUM CHLORIDE 0.9 % IV SOLN
INTRAVENOUS | Status: AC
  Filled 2023-04-26: qty 2

## 2023-04-26 MED ORDER — BUPIVACAINE-EPINEPHRINE (PF) 0.5% -1:200000 IJ SOLN
INTRAMUSCULAR | Status: DC | PRN
  Administered 2023-04-26: 20 mL

## 2023-04-26 MED ORDER — SCOPOLAMINE 1 MG/3DAYS TD PT72
1.0000 | MEDICATED_PATCH | TRANSDERMAL | Status: DC
  Administered 2023-04-26: 1.5 mg via TRANSDERMAL

## 2023-04-26 MED ORDER — 0.9 % SODIUM CHLORIDE (POUR BTL) OPTIME
TOPICAL | Status: DC | PRN
  Administered 2023-04-26: 300 mL

## 2023-04-26 MED ORDER — ENOXAPARIN SODIUM 40 MG/0.4ML IJ SOSY
PREFILLED_SYRINGE | INTRAMUSCULAR | Status: AC
  Filled 2023-04-26: qty 0.4

## 2023-04-26 MED ORDER — ENOXAPARIN SODIUM 40 MG/0.4ML IJ SOSY
40.0000 mg | PREFILLED_SYRINGE | Freq: Once | INTRAMUSCULAR | Status: AC
  Administered 2023-04-26: 40 mg via SUBCUTANEOUS

## 2023-04-26 MED ORDER — SUCCINYLCHOLINE CHLORIDE 200 MG/10ML IV SOSY
PREFILLED_SYRINGE | INTRAVENOUS | Status: DC | PRN
Start: 2023-04-26 — End: 2023-04-26
  Administered 2023-04-26: 120 mg via INTRAVENOUS

## 2023-04-26 MED ORDER — LACTATED RINGERS IV SOLN
INTRAVENOUS | Status: DC
Start: 2023-04-26 — End: 2023-04-26

## 2023-04-26 MED ORDER — GELATIN ABSORBABLE 12-7 MM EX MISC
CUTANEOUS | Status: AC
  Filled 2023-04-26: qty 1

## 2023-04-26 MED ORDER — PROPOFOL 10 MG/ML IV BOLUS
INTRAVENOUS | Status: DC | PRN
  Administered 2023-04-26: 200 mg via INTRAVENOUS

## 2023-04-26 MED ORDER — SCOPOLAMINE 1 MG/3DAYS TD PT72
MEDICATED_PATCH | TRANSDERMAL | Status: AC
  Filled 2023-04-26: qty 1

## 2023-04-26 MED ORDER — ROCURONIUM BROMIDE 100 MG/10ML IV SOLN
INTRAVENOUS | Status: DC | PRN
  Administered 2023-04-26: 40 mg via INTRAVENOUS

## 2023-04-26 MED ORDER — ROCURONIUM BROMIDE 10 MG/ML (PF) SYRINGE
PREFILLED_SYRINGE | INTRAVENOUS | Status: AC
  Filled 2023-04-26: qty 10

## 2023-04-26 MED ORDER — KETAMINE HCL 50 MG/5ML IJ SOSY
PREFILLED_SYRINGE | INTRAMUSCULAR | Status: AC
  Filled 2023-04-26: qty 5

## 2023-04-26 MED ORDER — IPRATROPIUM-ALBUTEROL 0.5-2.5 (3) MG/3ML IN SOLN
RESPIRATORY_TRACT | Status: AC
  Filled 2023-04-26: qty 3

## 2023-04-26 MED ORDER — KETAMINE HCL 10 MG/ML IJ SOLN
INTRAMUSCULAR | Status: DC | PRN
  Administered 2023-04-26: 50 mg via INTRAVENOUS

## 2023-04-26 MED ORDER — CHLORHEXIDINE GLUCONATE CLOTH 2 % EX PADS: 6.0000 | MEDICATED_PAD | Freq: Once | CUTANEOUS | Status: DC

## 2023-04-26 MED ORDER — SUGAMMADEX SODIUM 200 MG/2ML IV SOLN
INTRAVENOUS | Status: DC | PRN
  Administered 2023-04-26 (×2): 200 mg via INTRAVENOUS

## 2023-04-26 MED ORDER — DEXAMETHASONE SODIUM PHOSPHATE 10 MG/ML IJ SOLN
INTRAMUSCULAR | Status: DC | PRN
  Administered 2023-04-26: 5 mg via INTRAVENOUS

## 2023-04-26 MED ORDER — BUPIVACAINE-EPINEPHRINE (PF) 0.25% -1:200000 IJ SOLN
INTRAMUSCULAR | Status: AC
  Filled 2023-04-26: qty 30

## 2023-04-26 MED ORDER — ORAL CARE MOUTH RINSE: 15.0000 mL | Freq: Once | OROMUCOSAL | Status: AC

## 2023-04-26 MED ORDER — OXYCODONE HCL 5 MG/5ML PO SOLN: 5.0000 mg | Freq: Once | ORAL | Status: DC | PRN

## 2023-04-26 MED ORDER — ALBUTEROL SULFATE HFA 108 (90 BASE) MCG/ACT IN AERS
INHALATION_SPRAY | RESPIRATORY_TRACT | Status: DC | PRN
  Administered 2023-04-26: 4 via RESPIRATORY_TRACT

## 2023-04-26 MED ORDER — CHLORHEXIDINE GLUCONATE 0.12 % MT SOLN
15.0000 mL | Freq: Once | OROMUCOSAL | Status: AC
  Administered 2023-04-26: 15 mL via OROMUCOSAL

## 2023-04-26 SURGICAL SUPPLY — 25 items
BRIEF MESH DISP 2XL (UNDERPADS AND DIAPERS) ×1 IMPLANT
DRAPE LAPAROTOMY 100X77 ABD (DRAPES) ×1 IMPLANT
DRSG GAUZE FLUFF 36X18 (GAUZE/BANDAGES/DRESSINGS) ×1 IMPLANT
ELECT REM PT RETURN 9FT ADLT (ELECTROSURGICAL) ×1
ELECTRODE REM PT RTRN 9FT ADLT (ELECTROSURGICAL) ×1 IMPLANT
GLOVE BIOGEL PI IND STRL 7.5 (GLOVE) ×1 IMPLANT
GLOVE SURG SYN 7.0 (GLOVE) ×1 IMPLANT
GLOVE SURG SYN 7.0 PF PI (GLOVE) ×1 IMPLANT
GOWN STRL REUS W/ TWL LRG LVL3 (GOWN DISPOSABLE) ×2 IMPLANT
GOWN STRL REUS W/TWL LRG LVL3 (GOWN DISPOSABLE) ×2
KIT TURNOVER KIT A (KITS) ×1 IMPLANT
LABEL OR SOLS (LABEL) ×1 IMPLANT
LOOP VESSEL MAXI 1X406 RED (MISCELLANEOUS) IMPLANT
MANIFOLD NEPTUNE II (INSTRUMENTS) ×1 IMPLANT
NDL HYPO 22X1.5 SAFETY MO (MISCELLANEOUS) ×1 IMPLANT
NEEDLE HYPO 22X1.5 SAFETY MO (MISCELLANEOUS) ×1 IMPLANT
NS IRRIG 500ML POUR BTL (IV SOLUTION) ×1 IMPLANT
PACK BASIN MINOR ARMC (MISCELLANEOUS) ×1 IMPLANT
SOL PREP PVP 2OZ (MISCELLANEOUS) ×2
SOLUTION PREP PVP 2OZ (MISCELLANEOUS) ×1 IMPLANT
SPONGE SURGIFOAM ABS GEL 100C (HEMOSTASIS) IMPLANT
SPONGE SURGIFOAM ABS GEL 12-7 (HEMOSTASIS) IMPLANT
SURGILUBE 2OZ TUBE FLIPTOP (MISCELLANEOUS) ×1 IMPLANT
SYR 10ML LL (SYRINGE) ×1 IMPLANT
TRAP FLUID SMOKE EVACUATOR (MISCELLANEOUS) ×1 IMPLANT

## 2023-04-26 NOTE — Transfer of Care (Signed)
Immediate Anesthesia Transfer of Care Note  Patient: Linda Ortiz  Procedure(s) Performed: Linda Ortiz UNDER ANESTHESIA (Rectum) FISTULOTOMY (Rectum)  Patient Location: PACU  Anesthesia Type:General  Level of Consciousness: awake and alert   Airway & Oxygen Therapy: Patient Spontanous Breathing and Patient connected to face mask oxygen  Post-op Assessment: Report given to RN and Post -op Vital signs reviewed and stable  Post vital signs: Reviewed and stable  Last Vitals:  Vitals Value Taken Time  BP 151/106 04/26/23 1000  Temp    Pulse 103 04/26/23 1003  Resp 30 04/26/23 1003  SpO2 98 % 04/26/23 1003  Vitals shown include unfiled device data.  Last Pain:  Vitals:   04/26/23 0800  TempSrc:   PainSc: 2          Complications: No notable events documented.

## 2023-04-26 NOTE — Anesthesia Postprocedure Evaluation (Signed)
Anesthesia Post Note  Patient: [name]  Procedure(s) Performed: Francia Greaves UNDER ANESTHESIA (Rectum) FISTULOTOMY (Rectum)  Patient location during evaluation: PACU Anesthesia Type: General Level of consciousness: awake and alert Pain management: pain level controlled Vital Signs Assessment: post-procedure vital signs reviewed and stable Respiratory status: spontaneous breathing, nonlabored ventilation, respiratory function stable and patient connected to nasal cannula oxygen Cardiovascular status: blood pressure returned to baseline and stable Postop Assessment: no apparent nausea or vomiting Anesthetic complications: no   No notable events documented.   Last Vitals:  Vitals:   04/26/23 1000 04/26/23 1015  BP: (!) 151/106 (!) 126/97  Pulse: (!) 103 (!) 105  Resp: (!) 26 12  Temp: 36.8 C   SpO2: 94% 94%    Last Pain:  Vitals:   04/26/23 1015  TempSrc:   PainSc: 0-No pain                 Louie Boston

## 2023-04-26 NOTE — Anesthesia Preprocedure Evaluation (Signed)
Anesthesia Evaluation  Patient identified by MRN, date of birth, ID band Patient awake    Reviewed: Allergy & Precautions, H&P , NPO status , Patient's Chart, lab work & pertinent test results  History of Anesthesia Complications (+) PONV and history of anesthetic complications  Airway Mallampati: III  TM Distance: >3 FB Neck ROM: full    Dental no notable dental hx.    Pulmonary sleep apnea , Current Smoker   Pulmonary exam normal        Cardiovascular negative cardio ROS Normal cardiovascular exam     Neuro/Psych  PSYCHIATRIC DISORDERS Anxiety Depression    negative neurological ROS     GI/Hepatic negative GI ROS, Neg liver ROS,,,  Endo/Other    Class 4 obesity  Renal/GU      Musculoskeletal   Abdominal   Peds  Hematology  (+) Blood dyscrasia (Factor V Leiden on Warfarin)   Anesthesia Other Findings Past Medical History: No date: Anxiety No date: Blood clotting disorder (HCC) No date: Complication of anesthesia     Comment:  a.) PONV No date: Depression No date: Diverticulitis No date: Dysmenorrhea No date: Endometriosis No date: Factor V Leiden (HCC) No date: Hematoma of right lower extremity No date: Menorrhagia No date: Morbid obesity (HCC) No date: On warfarin therapy No date: Pancreatitis, acute No date: PCOS (polycystic ovarian syndrome) No date: Pneumonia No date: PONV (postoperative nausea and vomiting) No date: Rib fracture     Comment:  Left   Past Surgical History: 2005: APPENDECTOMY No date: ARTHROSCOPY KNEE W/ DRILLING 2007: BARIATRIC SURGERY 09/11/2020: BREAST BIOPSY; Right     Comment:  "Affirm" bx-RIGHT asymmetry-path pending 10/19/2015: CHOLECYSTECTOMY; N/A     Comment:  Procedure: LAPAROSCOPIC CHOLECYSTECTOMY ;  Surgeon:               Leafy Ro, MD;  Location: [facility] ORS;  Service:               General;  Laterality: N/A; 06/03/2016: COLONOSCOPY WITH PROPOFOL; N/A      Comment:  Procedure: COLONOSCOPY WITH PROPOFOL;  Surgeon: Christena Deem, MD;  Location: [facility] ENDOSCOPY;  Service:               Endoscopy;  Laterality: N/A; 05/18/2022: ESOPHAGOGASTRODUODENOSCOPY; N/A     Comment:  Procedure: ESOPHAGOGASTRODUODENOSCOPY (EGD);  Surgeon:               Toledo, Boykin Nearing, MD;  Location: [facility] ENDOSCOPY;                Service: Gastroenterology;  Laterality: N/A; 06/03/2016: ESOPHAGOGASTRODUODENOSCOPY (EGD) WITH PROPOFOL; N/A     Comment:  Procedure: ESOPHAGOGASTRODUODENOSCOPY (EGD) WITH               PROPOFOL;  Surgeon: Christena Deem, MD;  Location:               [facility] ENDOSCOPY;  Service: Endoscopy;  Laterality: N/A; 2017: EYE SURGERY No date: HERNIA REPAIR     Comment:  umbilical     Reproductive/Obstetrics negative OB ROS                             Anesthesia Physical Anesthesia Plan  ASA: 3  Anesthesia Plan: General ETT   Post-op Pain Management: Minimal or no pain anticipated   Induction: Intravenous  PONV Risk Score and Plan: 3 and  Ondansetron, Dexamethasone, Midazolam and Treatment may vary due to age or medical condition  Airway Management Planned: Oral ETT  Additional Equipment:   Intra-op Plan:   Post-operative Plan: Extubation in OR  Informed Consent: I have reviewed the patients History and Physical, chart, labs and discussed the procedure including the risks, benefits and alternatives for the proposed anesthesia with the patient or authorized representative who has indicated his/her understanding and acceptance.     Dental Advisory Given  Plan Discussed with: Anesthesiologist, CRNA and Surgeon  Anesthesia Plan Comments: (Patient consented for risks of anesthesia including but not limited to:  - adverse reactions to medications - damage to eyes, teeth, lips or other oral mucosa - nerve damage due to positioning  - sore throat or hoarseness - Damage to heart, brain, nerves,  lungs, other parts of body or loss of life  Patient voiced understanding and assent.)        Anesthesia Quick Evaluation

## 2023-04-26 NOTE — H&P (Signed)
Since original H and P patient has undergone I and D of abscess and had repeat CT scan that showed resolution of abscess. 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 Proceed as planned with EUA with possible fistulotomy and possible seton placement.  Cardiopulmonary Exam:  NWOB on RA RRR   Patient ID: Linda Ortiz, female   DOB: Oct 06, 1970, 52 y.o.   MRN: [id number] CC: Perirectal abscess History of Present Illness Linda Ortiz is a 52 y.o. female with medical history listed below and significant for history of factor V Leiden mutation and she is currently on warfarin.  She reports that approximately 2 and half weeks ago she developed a large boil on her left buttock.  This was very painful but not associated with any bleeding per rectum.  She started to have spontaneous drainage but the pain persisted so she went to the emergency department for evaluation.  There she was continue to have spontaneous drainage and they deloculated the abscess cavity.  She reports since then the pain has improved and she feels like the area is healing up.  Of note she has a family history of inflammatory bowel disease in her sister but has had multiple colonoscopies that did not show as of IBD.  At the present time the patient reports that there is very scant pink drainage from the wound that does not bother her   Past Medical History     Past Medical History:  Diagnosis Date   Anxiety     Blood clotting disorder (HCC)     Complication of anesthesia     Depression     Diverticulitis     Dysmenorrhea     Endometriosis     Hematoma of right lower extremity     Menorrhagia     Morbid obesity (HCC)     Pancreatitis, acute     PCOS (polycystic ovarian syndrome)     Pneumonia     PONV (postoperative nausea and vomiting)      General Anesthesia   Rib fracture      Left                  Past Surgical History:  Procedure Laterality Date   APPENDECTOMY   2005   ARTHROSCOPY  KNEE W/ DRILLING       BARIATRIC SURGERY   2007   BREAST BIOPSY Right 09/11/2020    "Affirm" bx-RIGHT asymmetry-path pending   CHOLECYSTECTOMY N/A 10/19/2015    Procedure: LAPAROSCOPIC CHOLECYSTECTOMY ;  Surgeon: Leafy Ro, MD;  Location: [facility] ORS;  Service: General;  Laterality: N/A;   COLONOSCOPY WITH PROPOFOL N/A 06/03/2016    Procedure: COLONOSCOPY WITH PROPOFOL;  Surgeon: Christena Deem, MD;  Location: [facility] ENDOSCOPY;  Service: Endoscopy;  Laterality: N/A;   ESOPHAGOGASTRODUODENOSCOPY N/A 05/18/2022    Procedure: ESOPHAGOGASTRODUODENOSCOPY (EGD);  Surgeon: Toledo, Boykin Nearing, MD;  Location: [facility] ENDOSCOPY;  Service: Gastroenterology;  Laterality: N/A;   ESOPHAGOGASTRODUODENOSCOPY (EGD) WITH PROPOFOL N/A 06/03/2016    Procedure: ESOPHAGOGASTRODUODENOSCOPY (EGD) WITH PROPOFOL;  Surgeon: Christena Deem, MD;  Location: [facility] ENDOSCOPY;  Service: Endoscopy;  Laterality: N/A;   EYE SURGERY   2017          Allergies      Allergies  Allergen Reactions   Morphine Itching              Current Outpatient Medications  Medication Sig Dispense Refill   cetirizine (ZYRTEC) 10 MG tablet Take 10 mg  by mouth daily.       Cholecalciferol (VITAMIN D3) 25 MCG (1000 UT) CAPS Take 2,000 Units by mouth daily.       Cyanocobalamin 1000 MCG/ML KIT Inject 1,000 mcg as directed every 30 (thirty) days.       folic acid (FOLVITE) 1 MG tablet Take 1 tablet (1 mg total) by mouth daily. 30 tablet 10   ipratropium-albuterol (DUONEB) 0.5-2.5 (3) MG/3ML SOLN Take 3 mLs by nebulization every 4 (four) hours as needed. 360 mL 1   lamoTRIgine (LAMICTAL) 200 MG tablet Take 1 tablet (200 mg total) by mouth daily. 90 tablet 1   spironolactone (ALDACTONE) 50 MG tablet Take 100 mg by mouth daily.       venlafaxine XR (EFFEXOR-XR) 150 MG 24 hr capsule TAKE 1 CAPSULE BY MOUTH DAILY 90 capsule 2   warfarin (COUMADIN) 6 MG tablet Take 3 mg by mouth See admin instructions. Take  tablet (3mg ) by mouth every Monday,  Tuesday, Thursday, Friday and Saturday morning          No current facility-administered medications for this visit.        Family History      Family History  Problem Relation Age of Onset   Arthritis Mother          rheumatoid   Hypertension Mother     Hyperlipidemia Father     Prostate cancer Father     Mental illness Maternal Grandmother          dementia   Alcohol abuse Maternal Grandmother     ALS Paternal Grandmother     Breast cancer Neg Hx              Social History Social History  Social History         Tobacco Use   Smoking status: Every Day      Current packs/day: 0.00      Average packs/day: 0.3 packs/day for 8.0 years (2.0 ttl pk-yrs)      Types: Cigarettes      Start date: 09/12/2007      Last attempt to quit: 09/12/2015      Years since quitting: 7.5   Smokeless tobacco: Never  Vaping Use   Vaping status: Never Used  Substance Use Topics   Alcohol use: Yes   Drug use: No            ROS Full ROS of systems performed and is otherwise negative there than what is stated in the HPI   Physical Exam Blood pressure (!) 135/93, pulse 88, height 5' 3.5" (1.613 m), weight (!) 351 lb (159.2 kg), last menstrual period 08/17/2019, SpO2 97%.   No acute distress, pupils equal round and reactive to light accommodation, normal work of breathing on room air, wears glasses, abdomen is obese, soft and nondistended and nontender. Rectal exam performed in the presence of a chaperone.  On the left lateral perianal skin there is a healing abscess cavity 1.  There is no purulence able to be expressed from this and there is no pain upon palpation    Data Reviewed I reviewed her labs as well as CT from her ED visit.  Her CT is significant for some air around the anus consistent with a draining abscess cavity.   I have personally reviewed the patient's imaging and medical records.     Assessment   Assessment Linda Ortiz is a 52 year old with a perirectal abscess that  had spontaneously drained and is now  healing well.  She is having minimal drainage from the wound   Plan     Plan The patient denied talked about the fact that she is healing well from her abscess.  I discussed with her that there is a chance that she may develop a fistula secondary from this perirectal abscess.  She should continue local wound care and we will plan to examine her again in 4 weeks to make sure that the area has adequately healed.

## 2023-04-26 NOTE — Op Note (Signed)
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  She was then placed in the jackknife positio on the operating room table.  Her anus and surrounding skin were then prepped and draped in the usual sterile fashion.  A surgical timeout was called identifying correct patient, site, side and procedure.  The perianal skin was then infiltrated with 20 cc of Marcaine liposomal bupivacaine solution.  The deeper tissue was then also infiltrated with another 20 cc of the same solution.  A digital rectal exam was performed and there was no evidence of masses and no gross blood.  On the left anterior anal canal there was a place that I was concerned was corresponding to the internal opening of the anal fistula.  SAnoscopy was then performed and there were grade 2 hemorrhoids in the right anterior position and the left lateral position.  On the external perianal skin at the left anterior position there was an opening that corresponded to the site of her previous I&D of an abscess.  A lacrimal duct probe was inserted into this and easily was seen within the anal canal where the internal opening was.  This did not seem to involve a lot of of the muscle so I elected to perform a fistulotomy.  A Bovie was used to open the fistula tract.  There is very little muscle involved.  The fistula tract was then curetted to debride the tissue.  Given her history of warfarin there was some bleeding from the fistula tract after I curetted it.  Hemostasis was obtained with cautery.  The anal canal was then copiously irrigated with warm saline solution and I reconfirmed hemostasis.  Externally there was an area of induration anterior to the fistula tract.  I did open this swelling with a knife but  there was no purulence and no concern for additional fistula tract.  The anal canal was then reexamined and hemostasis was further confirmed.  A Gelfoam pack was packed in the anus.  She was dressed with fluff and mesh underwear.  She was then flipped back onto the hospital gurney and extubated and taken to the PACU in good condition.  Prior to termination of the procedure all sponge and instrument counts were correct x 2.

## 2023-04-26 NOTE — Anesthesia Procedure Notes (Signed)
Procedure Name: Intubation Date/Time: 04/26/2023 8:49 AM  Performed by: Monico Hoar, CRNAPre-anesthesia Checklist: Patient identified, Patient being monitored, Timeout performed, Emergency Drugs available and Suction available Patient Re-evaluated:Patient Re-evaluated prior to induction Oxygen Delivery Method: Circle system utilized Preoxygenation: Pre-oxygenation with 100% oxygen Induction Type: IV induction Ventilation: Mask ventilation without difficulty Laryngoscope Size: 3 and McGrath Grade View: Grade I Tube type: Oral Tube size: 7.0 mm Number of attempts: 1 Airway Equipment and Method: Stylet Placement Confirmation: ETT inserted through vocal cords under direct vision, positive ETCO2 and breath sounds checked- equal and bilateral Secured at: 21 cm Tube secured with: Tape Dental Injury: Teeth and Oropharynx as per pre-operative assessment

## 2023-05-01 ENCOUNTER — Telehealth: Payer: Self-pay | Admitting: General Surgery

## 2023-05-01 NOTE — Telephone Encounter (Signed)
Patient had fistulotomy done 04/26/23 with Dr. Maurine Minister.  Patient states also with diverticulitis and she goes to the bathroom having bowel movements 10 plus times a day and then gets in the shower to clean herself well especially since having the surgery.  Patient is concerned with not being able to do this if the returns back to work too soon and is afraid she may set herself back if she does.  Was wondering if she should stay out of work until after Thanksgiving?  Please call patient.

## 2023-05-09 ENCOUNTER — Ambulatory Visit (INDEPENDENT_AMBULATORY_CARE_PROVIDER_SITE_OTHER): Payer: BC Managed Care – PPO | Admitting: General Surgery

## 2023-05-09 ENCOUNTER — Encounter: Payer: BC Managed Care – PPO | Admitting: General Surgery

## 2023-05-09 ENCOUNTER — Encounter: Payer: Self-pay | Admitting: General Surgery

## 2023-05-09 VITALS — BP 120/86 | HR 81 | Temp 98.0°F | Ht 63.5 in | Wt 346.0 lb

## 2023-05-09 DIAGNOSIS — K603 Anal fistula, unspecified: Secondary | ICD-10-CM

## 2023-05-09 DIAGNOSIS — Z09 Encounter for follow-up examination after completed treatment for conditions other than malignant neoplasm: Secondary | ICD-10-CM

## 2023-05-09 NOTE — Patient Instructions (Addendum)
Continue to to sitz baths especially after bowel movements.   Keep your bowel movements easy and soft.  You may use Metamucil 3 teaspoons a day in a full glass of water.   Follow up here in 3 months.

## 2023-05-15 ENCOUNTER — Telehealth: Payer: Self-pay | Admitting: General Surgery

## 2023-05-15 NOTE — Telephone Encounter (Signed)
Patient needs a work note.  She had to stay out of work today because she is still having trouble with rectal abscess.  She had fistulotomy done on 04/26/23 Dr. Maurine Minister. She is crying. Needs a work note for today and will return back to work tomorrow.  Please call her when note is ready. Thank you.

## 2023-05-17 NOTE — Progress Notes (Signed)
Patient returns postop status post fistulotomy.  She reports doing much better.  She says there has been very minimal drainage from the surgical sites.  She has restarted on her warfarin and is not having any bleeding.  Her pain is resolving.  She has no incontinence issues although she has urgency but this is not changed from preoperative.  On exam done in the presence of a chaperone she has an area of fistulotomy is healing well.  There is granulation tissue at the base of the excision tract.  There is no drainage from the wound.  I deferred doing a digital rectal or anoscopy exam today.  No immediate postoperative complications.  The wound looks like it is healing and well.  No evidence of abscess or recurrence of the fistula.  Will plan to see her again in several months to reassess how she is doing.

## 2023-07-26 ENCOUNTER — Emergency Department: Payer: 59

## 2023-07-26 ENCOUNTER — Other Ambulatory Visit: Payer: Self-pay

## 2023-07-26 ENCOUNTER — Emergency Department
Admission: EM | Admit: 2023-07-26 | Discharge: 2023-07-26 | Disposition: A | Discharge status: Home / Self Care | Payer: 59 | Attending: Emergency Medicine | Admitting: Emergency Medicine

## 2023-07-26 DIAGNOSIS — W19XXXA Unspecified fall, initial encounter: Secondary | ICD-10-CM

## 2023-07-26 DIAGNOSIS — R0981 Nasal congestion: Secondary | ICD-10-CM | POA: Diagnosis present

## 2023-07-26 DIAGNOSIS — W01198A Fall on same level from slipping, tripping and stumbling with subsequent striking against other object, initial encounter: Secondary | ICD-10-CM | POA: Diagnosis not present

## 2023-07-26 DIAGNOSIS — J449 Chronic obstructive pulmonary disease, unspecified: Secondary | ICD-10-CM | POA: Insufficient documentation

## 2023-07-26 DIAGNOSIS — J3489 Other specified disorders of nose and nasal sinuses: Secondary | ICD-10-CM | POA: Insufficient documentation

## 2023-07-26 LAB — RESP PANEL BY RT-PCR (RSV, FLU A&B, COVID)  RVPGX2
Influenza A by PCR: NEGATIVE
Influenza B by PCR: NEGATIVE
Resp Syncytial Virus by PCR: NEGATIVE
SARS Coronavirus 2 by RT PCR: NEGATIVE

## 2023-07-26 MED ORDER — HYDROCOD POLI-CHLORPHE POLI ER 10-8 MG/5ML PO SUER: 5.0000 mL | Freq: Two times a day (BID) | ORAL | 0 refills | Status: AC | PRN

## 2023-07-26 MED ORDER — IPRATROPIUM-ALBUTEROL 0.5-2.5 (3) MG/3ML IN SOLN
3.0000 mL | Freq: Once | RESPIRATORY_TRACT | Status: AC
  Administered 2023-07-26: 3 mL via RESPIRATORY_TRACT
  Filled 2023-07-26: qty 3

## 2023-07-26 MED ORDER — AZITHROMYCIN 250 MG PO TABS: ORAL_TABLET | ORAL | 0 refills | Status: DC

## 2023-07-26 NOTE — Discharge Instructions (Addendum)
You were evaluated in the ED for sinus pressure and a fall.  Your head CT is normal.  Your CT cervical spine is normal and your chest x-ray is clear of pneumonia.  It does show mild atelectasis in which deep breathing exercises daily can help with this.   Your respiratory panel which includes RSV, COVID and influenza is negative.  Please get plenty of rest and stay hydrated.  Follow-up with your primary care in 1 week.  Also, please follow-up with ophthalmology for concerns of your eye irritation.

## 2023-07-26 NOTE — ED Triage Notes (Signed)
Pt comes with c/o sinus pressure and might have RSV again. Pt states she also fell and is on warfarin.

## 2023-07-26 NOTE — ED Provider Notes (Signed)
[facility] Emergency Department Provider Note     Event Date/Time   First MD Initiated Contact with Patient 07/26/23 1530     (approximate)   History   Head Injury   HPI  Linda Ortiz is a 53 y.o. female with a history of OSA and COPD presents to the ED for evaluation of worsening sinus pressure and congestion x 4 days. Endorses green mucus and rhinorrhea. 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 She reports reoccurring eye infections that she believes this time has went into her sinuses causing her symptoms.  Denies fever and chills.  Has taking Tylenol with some relief.  Of note patient reports falling on Friday and hitting her head.  She denies LOC.  She states her symptoms have nothing to do with her falling. Denies headache, dizziness and weakness. No chest pain or shortness of breath.     Physical Exam   Triage Vital Signs: ED Triage Vitals  Encounter Vitals Group     BP 07/26/23 1503 125/74     Systolic BP Percentile --      Diastolic BP Percentile --      Pulse Rate 07/26/23 1503 71     Resp 07/26/23 1503 18     Temp 07/26/23 1503 (!) 97.3 F (36.3 C)     Temp src --      SpO2 07/26/23 1503 98 %     Weight 07/26/23 1504 (!) 360 lb (163.3 kg)     Height 07/26/23 1504 5' 3.5" (1.613 m)     Head Circumference --      Peak Flow --      Pain Score 07/26/23 1503 5     Pain Loc --      Pain Education --      Exclude from Growth Chart --     Most recent vital signs: Vitals:   07/26/23 1503  BP: 125/74  Pulse: 71  Resp: 18  Temp: (!) 97.3 F (36.3 C)  SpO2: 98%    General: Alert and oriented. INAD.  Speaking in complete sentences.    Head:  NCAT.  Tenderness with maxillary and frontal sinus percussion. Eyes:  PERRLA. EOMI without pain. left eye reveals erythemic sclera.  No discharge.   Nose:   Mucosa is moist. No rhinorrhea. Neck:   No cervical spine tenderness to palpation. Full ROM  without difficulty.  CV:  Good peripheral perfusion. RRR.  RESP:  Normal effort.  Mild bilateral expiratory wheezing.  No retractions.  NEURO: Cranial nerves II-XII intact. No focal deficits. Sensation and motor function intact. 5/5 muscle strength of UE & LE.   ED Results / Procedures / Treatments   Labs (all labs ordered are listed, but only abnormal results are displayed) Labs Reviewed  RESP PANEL BY RT-PCR (RSV, FLU A&B, COVID)  [id number]   RADIOLOGY  I personally viewed and evaluated these images as part of my medical decision making, as well as reviewing the written report by the radiologist.  ED Provider Interpretation: Chest x-ray reveals no focal consolidation.  Head CT and cervical spine appear normal.  DG Chest 2 View Result Date: 07/26/2023 CLINICAL DATA:  Wheezing. EXAM: CHEST - 2 VIEW COMPARISON:  Chest x-ray and CT chest dated Nov 07, 2022. FINDINGS: The heart size and mediastinal contours are within normal limits. Normal pulmonary vascularity. Mild streaky bibasilar atelectasis. No focal consolidation, pleural effusion, or pneumothorax. No acute osseous abnormality.  IMPRESSION: 1. Mild bibasilar atelectasis. Electronically Signed   By: Obie Dredge M.D.   On: 07/26/2023 17:18   CT HEAD WO CONTRAST ( ) Result Date: 07/26/2023 CLINICAL DATA:  Larey Seat.  On warfarin. EXAM: CT HEAD WITHOUT CONTRAST CT CERVICAL SPINE WITHOUT CONTRAST TECHNIQUE: Multidetector CT imaging of the head and cervical spine was performed following the standard protocol without intravenous contrast. Multiplanar CT image reconstructions of the cervical spine were also generated. RADIATION DOSE REDUCTION: This exam was performed according to the departmental dose-optimization program which includes automated exposure control, adjustment of the mA and/or kV according to patient size and/or use of iterative reconstruction technique. COMPARISON:  None Available. FINDINGS: CT HEAD FINDINGS Brain: No evidence of  acute infarction, hemorrhage, hydrocephalus, extra-axial collection or mass lesion/mass effect. Vascular: No hyperdense vessel or unexpected calcification. Skull: Normal. Negative for fracture or focal lesion. Sinuses/Orbits: No acute finding. Other: None. CT CERVICAL SPINE FINDINGS Alignment: No traumatic malalignment. Reversal of the normal cervical lordosis. Skull base and vertebrae: No acute fracture. No primary bone lesion or focal pathologic process. Soft tissues and spinal canal: No prevertebral fluid or swelling. No visible canal hematoma. Disc levels: Multilevel disc height loss and endplate spurring, greatest at C6-C7. Upper chest: Negative. Other: None. IMPRESSION: 1. No acute intracranial abnormality. 2. No acute cervical spine fracture or traumatic malalignment. Electronically Signed   By: Obie Dredge M.D.   On: 07/26/2023 17:17   CT Cervical Spine Wo Contrast Result Date: 07/26/2023 CLINICAL DATA:  Larey Seat.  On warfarin. EXAM: CT HEAD WITHOUT CONTRAST CT CERVICAL SPINE WITHOUT CONTRAST TECHNIQUE: Multidetector CT imaging of the head and cervical spine was performed following the standard protocol without intravenous contrast. Multiplanar CT image reconstructions of the cervical spine were also generated. RADIATION DOSE REDUCTION: This exam was performed according to the departmental dose-optimization program which includes automated exposure control, adjustment of the mA and/or kV according to patient size and/or use of iterative reconstruction technique. COMPARISON:  None Available. FINDINGS: CT HEAD FINDINGS Brain: No evidence of acute infarction, hemorrhage, hydrocephalus, extra-axial collection or mass lesion/mass effect. Vascular: No hyperdense vessel or unexpected calcification. Skull: Normal. Negative for fracture or focal lesion. Sinuses/Orbits: No acute finding. Other: None. CT CERVICAL SPINE FINDINGS Alignment: No traumatic malalignment. Reversal of the normal cervical lordosis. Skull  base and vertebrae: No acute fracture. No primary bone lesion or focal pathologic process. Soft tissues and spinal canal: No prevertebral fluid or swelling. No visible canal hematoma. Disc levels: Multilevel disc height loss and endplate spurring, greatest at C6-C7. Upper chest: Negative. Other: None. IMPRESSION: 1. No acute intracranial abnormality. 2. No acute cervical spine fracture or traumatic malalignment. Electronically Signed   By: Obie Dredge M.D.   On: 07/26/2023 17:17    PROCEDURES:  Critical Care performed: No  Procedures   MEDICATIONS ORDERED IN ED: Medications  ipratropium-albuterol (DUONEB) 0.5-2.5 (3) MG/3ML nebulizer solution 3 mL (3 mLs Nebulization Given 07/26/23 1642)    IMPRESSION / MDM / ASSESSMENT AND PLAN / ED COURSE  I reviewed the triage vital signs and the nursing notes.                               53 y.o. female presents to the emergency department for evaluation and treatment of sinus pressure and congestion and fall on Friday. See HPI for further details.   Differential diagnosis includes, but is not limited to conjunctivitis, sinusitis, viral URI, ICH, PNA  Patient's presentation  is most consistent with acute complicated illness / injury requiring diagnostic workup.  Patient is alert and oriented.  She is hemodynamically stable.  Physical exam findings are overall benign.  Pertinent for bilateral expiratory wheezing on physical exam will obtain chest x-ray to rule out pneumonia.  Will administer DuoNeb.  Patient reports this is her norm given her OSA history.   Normal neuroexam.    Respiratory panel is reassuring.  CT head and cervical spine obtained in triage.  Both are reassuring.  Chest x-ray is also reassuring.   Patient stable condition for discharge home.  Will prescribe short course of antibiotics and cough medicine. Encouraged to follow up with her PCP in 1 week for further management. ED return precautions discussed. All questions and  concerns were addressed during this ED visit.     FINAL CLINICAL IMPRESSION(S) / ED DIAGNOSES   Final diagnoses:  Fall, initial encounter  Sinus pressure   Rx / DC Orders   ED Discharge Orders          Ordered    chlorpheniramine-HYDROcodone (TUSSIONEX) 10-8 MG/5ML  Every 12 hours PRN        07/26/23 1753    azithromycin (ZITHROMAX Z-PAK) 250 MG tablet        07/26/23 1753            Note:  This document was prepared using Dragon voice recognition software and may include unintentional dictation errors.    Romeo Apple, Cicilia Clinger A, PA-C 07/26/23 1818    Concha Se, MD 07/26/23 [id number]

## 2023-07-26 NOTE — ED Notes (Signed)
Pt in CT and they will bring to ED 48 once scan is complete

## 2023-08-10 ENCOUNTER — Ambulatory Visit (INDEPENDENT_AMBULATORY_CARE_PROVIDER_SITE_OTHER): Payer: 59 | Admitting: General Surgery

## 2023-08-10 ENCOUNTER — Encounter: Payer: Self-pay | Admitting: General Surgery

## 2023-08-10 VITALS — BP 122/86 | HR 86 | Temp 98.0°F | Ht 63.5 in | Wt 348.0 lb

## 2023-08-10 DIAGNOSIS — K603 Anal fistula, unspecified: Secondary | ICD-10-CM

## 2023-08-10 NOTE — Patient Instructions (Signed)
 Continue to keep the area clean and dry.   Follow-up with our office as needed.  Please call and ask to speak with a nurse if you develop questions or concerns.

## 2023-08-14 NOTE — Progress Notes (Signed)
 Outpatient Surgical Follow Up  08/14/2023  Linda Ortiz is an 53 y.o. female.   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  In November she underwent an exam under anesthesia with anal fistulotomy.  She reports since then she has done very well.  She denies any drainage or any pain.  She denies any constipation is having normal bowel movements.  She has not had any bleeding in her stools.  She is back on her warfarin.  Past Medical History:  Diagnosis Date   Anxiety    Blood clotting disorder (HCC)    Complication of anesthesia    a.) PONV   Depression    Diverticulitis    DVT (deep venous thrombosis) (HCC)    bilateral   Dysmenorrhea    Endometriosis    Factor V Leiden (HCC)    Hematoma of right lower extremity    Menorrhagia    Morbid obesity (HCC)    On warfarin therapy    Pancreatitis, acute    PCOS (polycystic ovarian syndrome)    Pneumonia    PONV (postoperative nausea and vomiting)    Pulmonary embolism (HCC) 09/2020   left   Rib fracture    Left     Past Surgical History:  Procedure Laterality Date   APPENDECTOMY  2005   ARTHROSCOPY KNEE W/ DRILLING     BARIATRIC SURGERY  2007   BREAST BIOPSY Right 09/11/2020   "Affirm" bx-RIGHT asymmetry-path pending   CHOLECYSTECTOMY N/A 10/19/2015   Procedure: LAPAROSCOPIC CHOLECYSTECTOMY ;  Surgeon: Leafy Ro, MD;  Location: [facility] ORS;  Service: General;  Laterality: N/A;   COLONOSCOPY WITH PROPOFOL N/A 06/03/2016   Procedure: COLONOSCOPY WITH PROPOFOL;  Surgeon: Christena Deem, MD;  Location: [facility] ENDOSCOPY;  Service: Endoscopy;  Laterality: N/A;   ESOPHAGOGASTRODUODENOSCOPY N/A 05/18/2022   Procedure: ESOPHAGOGASTRODUODENOSCOPY (EGD);  Surgeon: Toledo, Boykin Nearing, MD;  Location: [facility] ENDOSCOPY;  Service: Gastroenterology;  Laterality: N/A;   ESOPHAGOGASTRODUODENOSCOPY (EGD) WITH PROPOFOL N/A 06/03/2016    Procedure: ESOPHAGOGASTRODUODENOSCOPY (EGD) WITH PROPOFOL;  Surgeon: Christena Deem, MD;  Location: [facility] ENDOSCOPY;  Service: Endoscopy;  Laterality: N/A;   EYE SURGERY  2017   FISTULOTOMY N/A 04/26/2023   Procedure: FISTULOTOMY;  Surgeon: Kandis Cocking, MD;  Location: [facility] ORS;  Service: General;  Laterality: N/A;   HERNIA REPAIR     umbilical    Family History  Problem Relation Age of Onset   Arthritis Mother        rheumatoid   Hypertension Mother    Hyperlipidemia Father    Prostate cancer Father    Mental illness Maternal Grandmother        dementia   Alcohol abuse Maternal Grandmother    ALS Paternal Grandmother    Breast cancer Neg Hx     Social History:  reports that she has been smoking cigarettes. She started smoking about 15 years ago. She has a 17.9 pack-year smoking history. She has been exposed to tobacco smoke. She has never used smokeless tobacco. She reports current alcohol use. She reports that she does not use drugs.  Allergies:  Allergies  Allergen Reactions   Morphine Itching    Medications reviewed.    ROS Full ROS performed and is otherwise negative other than what is stated in HPI   BP 122/86   Pulse 86   Temp 98 F (36.7 C)  Ht 5' 3.5" (1.613 m)   Wt (!) 348 lb (157.9 kg)   LMP 08/17/2019 (Approximate)   SpO2 97%   BMI 60.68 kg/m   Physical Exam Alert and oriented x 3, normal work of breathing on room air, regular rate and rhythm, abdomen is obese, anal exam performed the presence of a chaperone.  On the left lateral area there was an area of healed tissue with small scar consistent with where her I&D and fistulotomy was there was no induration or fluctuance at this area.  There was no drainage from this area.  On digital rectal exam she had no gross masses or lesions.  She had no gross blood.  I then performed an anoscopic exam.  She does have prominent hemorrhoidal tissue.  There is no areas that I am concerned of an internal opening  from a fistula.  No masses or lesions noted    No results found for this or any previous visit (from the past 48 hours). No results found.  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  She has had no evidence of recurrence of disease.  She is doing well.  She can follow-up in our office on an as-needed basis  Baker Pierini, M.D. [facility]

## 2023-12-03 ENCOUNTER — Other Ambulatory Visit: Payer: Self-pay | Admitting: Obstetrics and Gynecology
